# Patient Record
Sex: Female | Born: 1960 | Race: White | Hispanic: No | Smoking: Former smoker
Health system: Southern US, Community
[De-identification: ages and names within clinical notes are randomized; demographics above are authoritative.]

## PROBLEM LIST (undated history)

## (undated) DIAGNOSIS — F419 Anxiety disorder, unspecified: Secondary | ICD-10-CM

## (undated) DIAGNOSIS — I2510 Atherosclerotic heart disease of native coronary artery without angina pectoris: Secondary | ICD-10-CM

## (undated) DIAGNOSIS — Z7401 Bed confinement status: Secondary | ICD-10-CM

## (undated) DIAGNOSIS — I69311 Memory deficit following cerebral infarction: Secondary | ICD-10-CM

## (undated) DIAGNOSIS — F329 Major depressive disorder, single episode, unspecified: Secondary | ICD-10-CM

## (undated) DIAGNOSIS — M4628 Osteomyelitis of vertebra, sacral and sacrococcygeal region: Secondary | ICD-10-CM

## (undated) DIAGNOSIS — E785 Hyperlipidemia, unspecified: Secondary | ICD-10-CM

## (undated) DIAGNOSIS — F32A Depression, unspecified: Secondary | ICD-10-CM

## (undated) DIAGNOSIS — I2699 Other pulmonary embolism without acute cor pulmonale: Secondary | ICD-10-CM

## (undated) DIAGNOSIS — Z862 Personal history of diseases of the blood and blood-forming organs and certain disorders involving the immune mechanism: Secondary | ICD-10-CM

## (undated) DIAGNOSIS — I639 Cerebral infarction, unspecified: Secondary | ICD-10-CM

## (undated) DIAGNOSIS — L89159 Pressure ulcer of sacral region, unspecified stage: Secondary | ICD-10-CM

## (undated) DIAGNOSIS — I10 Essential (primary) hypertension: Secondary | ICD-10-CM

## (undated) DIAGNOSIS — Z931 Gastrostomy status: Secondary | ICD-10-CM

## (undated) DIAGNOSIS — M1990 Unspecified osteoarthritis, unspecified site: Secondary | ICD-10-CM

## (undated) DIAGNOSIS — E119 Type 2 diabetes mellitus without complications: Secondary | ICD-10-CM

## (undated) DIAGNOSIS — E039 Hypothyroidism, unspecified: Secondary | ICD-10-CM

## (undated) DIAGNOSIS — G43909 Migraine, unspecified, not intractable, without status migrainosus: Secondary | ICD-10-CM

## (undated) DIAGNOSIS — I82409 Acute embolism and thrombosis of unspecified deep veins of unspecified lower extremity: Secondary | ICD-10-CM

## (undated) DIAGNOSIS — E1142 Type 2 diabetes mellitus with diabetic polyneuropathy: Secondary | ICD-10-CM

## (undated) DIAGNOSIS — E113519 Type 2 diabetes mellitus with proliferative diabetic retinopathy with macular edema, unspecified eye: Secondary | ICD-10-CM

## (undated) HISTORY — DX: Hypothyroidism, unspecified: E03.9

## (undated) HISTORY — DX: Major depressive disorder, single episode, unspecified: F32.9

## (undated) HISTORY — PX: CORONARY ARTERY BYPASS GRAFT: SHX141

## (undated) HISTORY — DX: Anxiety disorder, unspecified: F41.9

## (undated) HISTORY — DX: Migraine, unspecified, not intractable, without status migrainosus: G43.909

## (undated) HISTORY — DX: Unspecified osteoarthritis, unspecified site: M19.90

## (undated) HISTORY — PX: TUBAL LIGATION: SHX77

## (undated) HISTORY — DX: Personal history of diseases of the blood and blood-forming organs and certain disorders involving the immune mechanism: Z86.2

## (undated) HISTORY — DX: Depression, unspecified: F32.A

---

## 1999-01-09 ENCOUNTER — Encounter: Admission: RE | Admit: 1999-01-09 | Discharge: 1999-04-09 | Payer: Self-pay | Admitting: Family Medicine

## 2000-07-07 ENCOUNTER — Other Ambulatory Visit: Admission: RE | Admit: 2000-07-07 | Discharge: 2000-07-07 | Payer: Self-pay | Admitting: Obstetrics and Gynecology

## 2001-06-11 ENCOUNTER — Encounter: Admission: RE | Admit: 2001-06-11 | Discharge: 2001-09-09 | Payer: Self-pay

## 2001-06-26 ENCOUNTER — Other Ambulatory Visit: Admission: RE | Admit: 2001-06-26 | Discharge: 2001-06-26 | Payer: Self-pay | Admitting: Obstetrics and Gynecology

## 2001-09-09 ENCOUNTER — Encounter: Admission: RE | Admit: 2001-09-09 | Discharge: 2001-12-08 | Payer: Self-pay

## 2001-12-15 ENCOUNTER — Inpatient Hospital Stay (HOSPITAL_COMMUNITY): Admission: AD | Admit: 2001-12-15 | Discharge: 2001-12-15 | Payer: Self-pay | Admitting: Obstetrics and Gynecology

## 2001-12-15 ENCOUNTER — Encounter: Payer: Self-pay | Admitting: Obstetrics and Gynecology

## 2001-12-21 ENCOUNTER — Ambulatory Visit (HOSPITAL_COMMUNITY): Admission: RE | Admit: 2001-12-21 | Discharge: 2001-12-21 | Payer: Self-pay | Admitting: Obstetrics and Gynecology

## 2001-12-21 ENCOUNTER — Inpatient Hospital Stay (HOSPITAL_COMMUNITY): Admission: AD | Admit: 2001-12-21 | Discharge: 2001-12-24 | Payer: Self-pay | Admitting: Obstetrics & Gynecology

## 2002-02-03 ENCOUNTER — Other Ambulatory Visit: Admission: RE | Admit: 2002-02-03 | Discharge: 2002-02-03 | Payer: Self-pay | Admitting: Obstetrics and Gynecology

## 2011-05-30 ENCOUNTER — Ambulatory Visit (INDEPENDENT_AMBULATORY_CARE_PROVIDER_SITE_OTHER): Payer: BC Managed Care – PPO | Admitting: Family Medicine

## 2011-05-30 DIAGNOSIS — F411 Generalized anxiety disorder: Secondary | ICD-10-CM

## 2011-05-30 DIAGNOSIS — K3189 Other diseases of stomach and duodenum: Secondary | ICD-10-CM

## 2011-05-30 DIAGNOSIS — IMO0001 Reserved for inherently not codable concepts without codable children: Secondary | ICD-10-CM

## 2011-06-07 ENCOUNTER — Ambulatory Visit (INDEPENDENT_AMBULATORY_CARE_PROVIDER_SITE_OTHER): Payer: BC Managed Care – PPO | Admitting: Internal Medicine

## 2011-06-07 ENCOUNTER — Ambulatory Visit: Payer: BC Managed Care – PPO | Admitting: Radiology

## 2011-06-07 VITALS — BP 120/76 | HR 72 | Temp 98.3°F | Resp 16 | Ht 62.0 in | Wt 166.0 lb

## 2011-06-07 DIAGNOSIS — M533 Sacrococcygeal disorders, not elsewhere classified: Secondary | ICD-10-CM

## 2011-06-07 NOTE — Progress Notes (Signed)
Ruth Mayo comes in today with a 2 to three-year history of recurrent problems with pain in the coccyx. This is gotten progressively worse. She now has some problems sleeping because of it. It is usually okay in the morning and worsens as the day goes on even if she's sitting on a pillow. She knows of no injury that started this. There is no other history of arthritis. She has recently been diagnosed with diabetes by Dr. Audria Nine started on metformin.  Review of systems includes no other musculoskeletal problems.  Physical examination reveals a tender coccyx and lower sacrum with some bony prominences that are not symmetrical.   X-ray reveals exostoses on the coccyxbut no destructive lesions.  Problem #1 coxodynia  Plan is orthopedic referral

## 2011-07-03 ENCOUNTER — Ambulatory Visit (INDEPENDENT_AMBULATORY_CARE_PROVIDER_SITE_OTHER): Payer: BC Managed Care – PPO | Admitting: Family Medicine

## 2011-07-03 ENCOUNTER — Encounter: Payer: Self-pay | Admitting: Family Medicine

## 2011-07-03 DIAGNOSIS — F419 Anxiety disorder, unspecified: Secondary | ICD-10-CM | POA: Insufficient documentation

## 2011-07-03 DIAGNOSIS — E109 Type 1 diabetes mellitus without complications: Secondary | ICD-10-CM

## 2011-07-03 DIAGNOSIS — E669 Obesity, unspecified: Secondary | ICD-10-CM

## 2011-07-03 DIAGNOSIS — Z23 Encounter for immunization: Secondary | ICD-10-CM

## 2011-07-03 DIAGNOSIS — Z0000 Encounter for general adult medical examination without abnormal findings: Secondary | ICD-10-CM

## 2011-07-03 DIAGNOSIS — Z01419 Encounter for gynecological examination (general) (routine) without abnormal findings: Secondary | ICD-10-CM

## 2011-07-03 DIAGNOSIS — E119 Type 2 diabetes mellitus without complications: Secondary | ICD-10-CM | POA: Insufficient documentation

## 2011-07-03 DIAGNOSIS — R1013 Epigastric pain: Secondary | ICD-10-CM | POA: Insufficient documentation

## 2011-07-03 DIAGNOSIS — E66811 Obesity, class 1: Secondary | ICD-10-CM | POA: Insufficient documentation

## 2011-07-03 LAB — IFOBT (OCCULT BLOOD): IFOBT: NEGATIVE

## 2011-07-03 MED ORDER — INSULIN ASPART PROT & ASPART (70-30 MIX) 100 UNIT/ML ~~LOC~~ SUSP: SUBCUTANEOUS | Status: DC

## 2011-07-03 MED ORDER — LISINOPRIL 5 MG PO TABS: 5.0000 mg | ORAL_TABLET | Freq: Every day | ORAL | Status: DC

## 2011-07-03 MED ORDER — LORAZEPAM 1 MG PO TABS: ORAL_TABLET | ORAL | Status: DC

## 2011-07-03 NOTE — Patient Instructions (Signed)
Tetanus, Diphtheria (Td) or Tetanus, Diphtheria, Pertussis (Tdap) Vaccine What You Need to Know WHY GET VACCINATED? Tetanus, diphtheria and pertussis can be very serious diseases. TETANUS (Lockjaw) causes painful muscle spasms and stiffness, usually all over the body.  Tetanus can lead to tightening of muscles in the head and neck so the victim cannot open his mouth or swallow, or sometimes even breathe. Tetanus kills about 1 out of 5 people who are infected.  DIPHTHERIA can cause a thick membrane to cover the back of the throat.  Diphtheria can lead to breathing problems, paralysis, heart failure, and even death.  PERTUSSIS (Whooping Cough) causes severe coughing spells which can lead to difficulty breathing, vomiting, and disturbed sleep.  Pertussis can lead to weight loss, incontinence, rib fractures and passing out from violent coughing. Up to 2 in 100 adolescents and 5 in 100 adults with pertussis are hospitalized or have complications, including pneumonia and death.  These 3 diseases are all caused by bacteria. Diphtheria and pertussis are spread from person to person. Tetanus enters the body through cuts, scratches, or wounds. The [place] saw as many as 200,000 cases a year of diphtheria and pertussis before vaccines were available, and hundreds of cases of tetanus. Since then, tetanus and diphtheria cases have dropped by about 99% and pertussis cases by about 92%. Children 44 years of age and younger get DTaP vaccine to protect them from these three diseases. But older children, adolescents, and adults need protection too. VACCINES FOR ADOLESCENTS AND ADULTS: TD AND TDAP Two vaccines are available to protect people 51 years of age and older from these diseases:  Td vaccine has been used for many years. It protects against tetanus and diphtheria.   Tdap vaccine was licensed in 2005. It is the first vaccine for adolescents and adults that protects against pertussis as well as tetanus  and diphtheria.  A Td booster dose is recommended every 10 years. Tdap is given only once. WHICH VACCINE, AND WHEN? Ages 7 through 18 years  A dose of Tdap is recommended at age 32 or 73. This dose could be given as early as age 58 for children who missed one or more childhood doses of DTaP.   Children and adolescents who did not get a complete series of DTaP shots by age 22 should complete the series using a combination of Td and Tdap.  Age 27 years and Older  All adults should get a booster dose of Td every 10 years. Adults under 65 who have never gotten Tdap should get a dose of Tdap as their next booster dose. Adults 65 and older may get one booster dose of Tdap.   Adults (including women who may become pregnant and adults 36 and older) who expect to have close contact with a baby younger than 94 months of age should get a dose of Tdap to help protect the baby from pertussis.   Healthcare professionals who have direct patient contact in hospitals or clinics should get one dose of Tdap.  Protection After a Wound  A person who gets a severe cut or burn might need a dose of Td or Tdap to prevent tetanus infection. Tdap should be used for anyone who has never had a dose previously. Td should be used if Tdap is not available, or for:   Anybody who has already had a dose of Tdap.   Children 7 through 87 years of age who completed the childhood DTaP series.   Adults 65 and older.  Pregnant Women  Pregnant women who have never had a dose of Tdap should get one, after the 20th week of gestation and preferably during the 3rd trimester. If they do not get Tdap during their pregnancy they should get a dose as soon as possible after delivery. Pregnant women who have previously received Tdap and need tetanus or diphtheria vaccine while pregnant should get Td.  Tdap and Td may be given at the same time as other vaccines. SOME PEOPLE SHOULD NOT BE VACCINATED OR SHOULD WAIT  Anyone who has had a  life-threatening allergic reaction after a dose of any tetanus, diphtheria, or pertussis containing vaccine should not get Td or Tdap.   Anyone who has a severe allergy to any component of a vaccine should not get that vaccine. Tell your doctor if the person getting the vaccine has any severe allergies.   Anyone who had a coma, or long or multiple seizures within 7 days after a dose of DTP or DTaP should not get Tdap, unless a cause other than the vaccine was found. These people may get Td.   Talk to your doctor if the person getting either vaccine:   Has epilepsy or another nervous system problem.   Had severe swelling or severe pain after a previous dose of DTP, DTaP, DT, Td, or Tdap vaccine.   Has had Guillain Barr Syndrome (GBS).  Anyone who has a moderate or severe illness on the day the shot is scheduled should usually wait until they recover before getting Tdap or Td vaccine. A person with a mild illness or low fever can usually be vaccinated. WHAT ARE THE RISKS FROM TDAP AND TD VACCINES? With a vaccine, as with any medicine, there is always a small risk of a life-threatening allergic reaction or other serious problem. Brief fainting spells and related symptoms (such as jerking movements) can happen after any medical procedure, including vaccination. Sitting or lying down for about 15 minutes after a vaccination can help prevent fainting and injuries caused by falls. Tell your doctor if the patient feels dizzy or lightheaded, or has vision changes or ringing in the ears. Getting tetanus, diphtheria, or pertussis would be much more likely to lead to severe problems than getting either Td or Tdap vaccine. Problems reported after Td and Tdap vaccines are listed below. Mild Problems (noticeable, but did not interfere with activities) Tdap  Pain (about 3 in 4 adolescents and 2 in 3 adults).   Redness or swelling (about 1 in 5).   Mild fever of at least 100.4 F (38 C) (up to about 1 in  25 adolescents and 1 in 100 adults).   Headache (about 4 in 10 adolescents and 3 in 10 adults).   Tiredness (about 1 in 3 adolescents and 1 in 4 adults).   Nausea, vomiting, diarrhea, or stomach ache (up to 1 in 4 adolescents and 1 in 10 adults).   Chills, body aches, sore joints, rash, or swollen glands (uncommon).  Td  Pain (up to about 8 in 10).   Redness or swelling at the injection site (up to about 1 in 3).   Mild fever (up to about 1 in 15).   Headache or tiredness (uncommon).  Moderate Problems (interfered with activities, but did not require medical attention) Tdap  Pain at the injection site (about 1 in 20 adolescents and 1 in 100 adults).   Redness or swelling at the injection site (up to about 1 in 16 adolescents and 1 in 25  adults).   Fever over 102 F (38.9 C) (about 1 in 100 adolescents and 1 in 250 adults).   Headache (1 in 300).   Nausea, vomiting, diarrhea, or stomach ache (up to 3 in 100 adolescents and 1 in 100 adults).  Td  Fever over 102 F (38.9 C) (rare).  Tdap or Td  Extensive swelling of the arm where the shot was given (up to about 3 in 100).  Severe Problems (Unable to perform usual activities; required medical attention) Tdap or Td  Swelling, severe pain, bleeding, and redness in the arm where the shot was given (rare).  A severe allergic reaction could occur after any vaccine. They are estimated to occur less than once in a million doses. WHAT IF THERE IS A SEVERE REACTION? What should I look for? Any unusual condition, such as a severe allergic reaction or a high fever. If a severe allergic reaction occurred, it would be within a few minutes to an hour after the shot. Signs of a serious allergic reaction can include difficulty breathing, weakness, hoarseness or wheezing, a fast heartbeat, hives, dizziness, paleness, or swelling of the throat. What should I do?  Call a doctor, or get the person to a doctor right away.   Tell your doctor  what happened, the date and time it happened, and when the vaccination was given.   Ask your provider to report the reaction by filing a Vaccine Adverse Event Reporting System (VAERS) form. Or, you can file this report through the VAERS website at www.vaers.LAgents.no or by calling 1-567-022-0429.  VAERS does not provide medical advice. THE NATIONAL VACCINE INJURY COMPENSATION PROGRAM The National Vaccine Injury Compensation Program (VICP) was created in 1986. Persons who believe they may have been injured by a vaccine can learn about the program and about filing a claim by calling 1-(929)677-8400 or visiting the VICP website at SpiritualWord.at. HOW CAN I LEARN MORE?  Your doctor can give you the vaccine package insert or suggest other sources of information.   Call your local or state health department.   Contact the Centers for Disease Control and Prevention (CDC):   Call 859 330 3607 (1-800-CDC-INFO).   Visit the [facility] website at PicCapture.uy.  CDC Td and Tdap Interim VIS (1/12) Document Released: 02/17/2006 Document Revised: 01/08/2011 Document Reviewed: 05/29/2010 [facility] Patient Information 2012 [place], [place]   .Keeping You Healthy  Get These Tests 1. Blood Pressure- Have your blood pressure checked once a year by your health care provider.  Normal blood pressure is 120/80. 2. Weight- Have your body mass index (BMI) calculated to screen for obesity.  BMI is measure of body fat based on height and weight.  You can also calculate your own BMI at https://www.west-esparza.com/. 3. Cholesterol- Have your cholesterol checked every 5 years starting at age 75 then yearly starting at age 18. 4. Chlamydia, HIV, and other sexually transmitted diseases- Get screened every year until age 44, then within three months of each new sexual provider. 5. Pap Smear- Every 1-3 years; discuss with your health care provider. 6. Mammogram- Every year starting at age 53  Take these  medicines  Calcium with Vitamin D-Your body needs 1200 mg of Calcium each day and [phone] IU of Vitamin D daily.  Your body can only absorb 500 mg of Calcium at a time so Calcium must be taken in 2 or 3 divided doses throughout the day.  Multivitamin with folic acid- Once daily if it is possible for you to become pregnant.  Get these  Immunizations  Gardasil-Series of three doses; prevents HPV related illness such as genital warts and cervical cancer.  Menactra-Single dose; prevents meningitis.  Tetanus shot- Every 10 years.  Flu shot-Every year.  Take these steps 1. Do not smoke-Your healthcare provider can help you quit.  For tips on how to quit go to www.smokefree.gov or call 1-800 QUITNOW. 2. Be physically active- Exercise 5 days a week for at least 30 minutes.  If you are not already physically active, start slow and gradually work up to 30 minutes of moderate physical activity.  Examples of moderate activity include walking briskly, dancing, swimming, bicycling, etc. 3. Breast Cancer- A self breast exam every month is important for early detection of breast cancer.  For more information and instruction on self breast exams, ask your healthcare provider or San[place]iscoGazette.es. 4. Eat a healthy diet- Eat a variety of healthy foods such as fruits, vegetables, whole grains, low fat milk, low fat cheeses, yogurt, lean meats, poultry and fish, beans, nuts, tofu, etc.  For more information go to www. Thenutritionsource.org 5. Drink alcohol in moderation- Limit alcohol intake to one drink or less per day. Never drink and drive. 6. Depression- Your emotional health is as important as your physical health.  If you're feeling down or losing interest in things you normally enjoy please talk to your healthcare provider about being screened for depression. 7. Dental visit- Brush and floss your teeth twice daily; visit your dentist twice a year. 8. Eye doctor- Get an eye exam  at least every 2 years. 9. Helmet use- Always wear a helmet when riding a bicycle, motorcycle, rollerblading or skateboarding. 10. Safe sex- If you may be exposed to sexually transmitted infections, use a condom. 11. Seat belts- Seat belts can save your live; always wear one. 12. Smoke/Carbon Monoxide detectors- These detectors need to be installed on the appropriate level of your home. Replace batteries at least once a year. 13. Skin cancer- When out in the sun please cover up and use sunscreen 15 SPF or higher. 14. Violence- If anyone is threatening or hurting you, please tell your healthcare provider.

## 2011-07-03 NOTE — Progress Notes (Signed)
Subjective:    Patient ID: Ruth Mayo, female    DOB: 02/20/1961, 51 y.o.   MRN: [id number]  HPI This 51 y.o Cauc female presents for CPE with PAP. She has Insulin-dependent Diabetes with  last A1c= 9.6 % on 05/30/11. Her last PAP was more than 3 years ago; she has had no recent abnl PAPs. She is married and recent personal change includes having mother-in-law move within 1 mile of her home. She also has a 97 y.o. Son with ADHD. This has caused increased anxiety, not responding to Lorazepam  prescribed last visit. She does exercise- walking twice a week for ~ 20 minutes.  Family Hx: Positive for HTN, DM, Cardiovascular Disease, High Cholesterol, Asthma, Mental Illness  HCM: last Dilated Diabetic Eye exam- 2 years ago  Review of Systems  HENT: Positive for neck stiffness.   Gastrointestinal: Positive for nausea. Negative for vomiting, abdominal pain, diarrhea, constipation and blood in stool.       Heartburn/ indigestion- pt did not get Pepcid AC as advised at last visit  Genitourinary:       Nocturia > 2 times each night  Musculoskeletal: Positive for arthralgias.       Stiffness involving hands, wrists, shoulders, hips Hx of right ankle injury, now with pain and sweeling for which she takes Ibuprofen 400 mg 5-6 x daily  Neurological: Positive for headaches. Negative for dizziness, syncope, weakness and numbness.  Psychiatric/Behavioral: The patient is nervous/anxious.   All other systems reviewed and are negative.       Objective:   Physical Exam  Nursing note and vitals reviewed. Constitutional: She is oriented to person, place, and time. She appears well-developed and well-nourished. No distress.  HENT:  Head: Normocephalic and atraumatic.  Right Ear: External ear normal.  Left Ear: External ear normal.  Nose: Nose normal.  Mouth/Throat: Oropharynx is clear and moist.  Eyes: Conjunctivae and EOM are normal. Pupils are equal, round, and reactive to light. No scleral  icterus.  Neck: Normal range of motion. Neck supple. No thyromegaly present.  Cardiovascular: Normal rate, regular rhythm, normal heart sounds and intact distal pulses.  Exam reveals no gallop.   No murmur heard. Pulmonary/Chest: Effort normal and breath sounds normal. No respiratory distress.  Abdominal: Soft. Bowel sounds are normal. She exhibits no distension and no mass. There is tenderness. There is no guarding.  Genitourinary: Guaiac negative stool.       NEFG; vaginal orifice and mucosa normal. Cervix normal with minimal yellowish discharge. (PAP done) Bimanual exam: unremarkable- Uterus midline and no adnexal tenderness Rectal exam: no masses noted, hemorrhoids present w/o active bleeding.  Musculoskeletal: She exhibits no edema and no tenderness.  Lymphadenopathy:    She has no cervical adenopathy.  Neurological: She is alert and oriented to person, place, and time. She has normal reflexes. No cranial nerve deficit. Coordination normal.  Skin: Skin is warm and dry.  Psychiatric: She has a normal mood and affect. Her behavior is normal. Judgment and thought content normal.  Breasts: Symmetric; left breast with nontender, mobile 1 cm nodule medial to areola                No nipple discharge or inverted nipples. No axillary lymphadenopathy.  Results for orders placed in visit on 07/03/11  IFOBT (OCCULT BLOOD)      Component Value Range   IFOBT Negative           Assessment & Plan:   1. Routine general medical examination at a  health care facility  MM Digital Screening, EKG 12-Lead, IFOBT POC (occult bld, rslt in office)  2. Immunization due  Tdap vaccine greater than or equal to 7yo IM  3. Pap test, as part of routine gynecological examination  Pap IG w/ reflex to HPV when ASC-U  4. Type 1 diabetes mellitus  Microalbumin, urine; continue current Insulin dose and home monitoring  5. Anxiety disorder  Increase Lorazepam to 1 mg tab which pt can take 1/2 to 1 tab up to 3x daily  as needed; currently, she only needs  to relax and sleep

## 2011-07-04 LAB — PAP IG W/ RFLX HPV ASCU

## 2011-07-05 NOTE — Progress Notes (Signed)
Quick Note:  Notify pt of Normal results. ______ 

## 2011-07-16 ENCOUNTER — Other Ambulatory Visit: Payer: Self-pay | Admitting: Physician Assistant

## 2011-07-16 DIAGNOSIS — Z1239 Encounter for other screening for malignant neoplasm of breast: Secondary | ICD-10-CM

## 2011-07-16 DIAGNOSIS — Z139 Encounter for screening, unspecified: Secondary | ICD-10-CM

## 2011-07-29 ENCOUNTER — Ambulatory Visit
Admission: RE | Admit: 2011-07-29 | Discharge: 2011-07-29 | Disposition: A | Discharge status: Home / Self Care | Payer: BC Managed Care – PPO | Source: Ambulatory Visit | Attending: Physician Assistant | Admitting: Physician Assistant

## 2011-07-29 DIAGNOSIS — Z1239 Encounter for other screening for malignant neoplasm of breast: Secondary | ICD-10-CM

## 2011-07-31 ENCOUNTER — Other Ambulatory Visit: Payer: Self-pay | Admitting: Physician Assistant

## 2011-07-31 ENCOUNTER — Telehealth: Payer: Self-pay | Admitting: Radiology

## 2011-07-31 DIAGNOSIS — R928 Other abnormal and inconclusive findings on diagnostic imaging of breast: Secondary | ICD-10-CM

## 2011-07-31 NOTE — Telephone Encounter (Signed)
Informed pt of report--she had not [facility]. Told her to call them asap to arrange appt for further imaging.

## 2011-07-31 NOTE — Telephone Encounter (Signed)
Message copied by Luretha Murphy on Wed Jul 31, 2011  3:09 PM ------      Message from: Porfirio Oar S      Created: Tue Jul 30, 2011 12:30 PM       Please call the imaging site.  Has the patient been notified of the need for additional imagine, or do we need to?

## 2011-08-05 ENCOUNTER — Ambulatory Visit
Admission: RE | Admit: 2011-08-05 | Discharge: 2011-08-05 | Disposition: A | Discharge status: Home / Self Care | Payer: BC Managed Care – PPO | Source: Ambulatory Visit | Attending: Physician Assistant | Admitting: Physician Assistant

## 2011-08-05 DIAGNOSIS — R928 Other abnormal and inconclusive findings on diagnostic imaging of breast: Secondary | ICD-10-CM

## 2011-09-04 ENCOUNTER — Other Ambulatory Visit: Payer: Self-pay | Admitting: Family Medicine

## 2011-09-05 ENCOUNTER — Other Ambulatory Visit: Payer: Self-pay

## 2011-10-02 ENCOUNTER — Ambulatory Visit: Payer: BC Managed Care – PPO | Admitting: Family Medicine

## 2011-12-09 NOTE — Progress Notes (Signed)
This encounter was created in error - please disregard.

## 2017-03-08 DIAGNOSIS — H5213 Myopia, bilateral: Secondary | ICD-10-CM | POA: Diagnosis not present

## 2017-05-20 ENCOUNTER — Ambulatory Visit: Payer: Self-pay | Admitting: Physician Assistant

## 2017-06-06 ENCOUNTER — Ambulatory Visit: Payer: Self-pay | Admitting: Physician Assistant

## 2017-06-13 ENCOUNTER — Other Ambulatory Visit: Payer: Self-pay

## 2017-06-13 ENCOUNTER — Ambulatory Visit: Payer: BLUE CROSS/BLUE SHIELD | Admitting: Physician Assistant

## 2017-06-13 ENCOUNTER — Encounter: Payer: Self-pay | Admitting: Physician Assistant

## 2017-06-13 VITALS — BP 140/80 | HR 87 | Temp 98.5°F | Resp 18 | Ht 62.0 in | Wt 169.8 lb

## 2017-06-13 DIAGNOSIS — Z794 Long term (current) use of insulin: Secondary | ICD-10-CM | POA: Diagnosis not present

## 2017-06-13 DIAGNOSIS — F411 Generalized anxiety disorder: Secondary | ICD-10-CM | POA: Diagnosis not present

## 2017-06-13 DIAGNOSIS — E039 Hypothyroidism, unspecified: Secondary | ICD-10-CM

## 2017-06-13 DIAGNOSIS — Z1211 Encounter for screening for malignant neoplasm of colon: Secondary | ICD-10-CM

## 2017-06-13 DIAGNOSIS — E1142 Type 2 diabetes mellitus with diabetic polyneuropathy: Secondary | ICD-10-CM

## 2017-06-13 DIAGNOSIS — F439 Reaction to severe stress, unspecified: Secondary | ICD-10-CM

## 2017-06-13 DIAGNOSIS — E7800 Pure hypercholesterolemia, unspecified: Secondary | ICD-10-CM

## 2017-06-13 DIAGNOSIS — Z1329 Encounter for screening for other suspected endocrine disorder: Secondary | ICD-10-CM | POA: Diagnosis not present

## 2017-06-13 DIAGNOSIS — R7989 Other specified abnormal findings of blood chemistry: Secondary | ICD-10-CM

## 2017-06-13 DIAGNOSIS — E1140 Type 2 diabetes mellitus with diabetic neuropathy, unspecified: Secondary | ICD-10-CM | POA: Insufficient documentation

## 2017-06-13 MED ORDER — LORAZEPAM 1 MG PO TABS: 1.0000 mg | ORAL_TABLET | Freq: Two times a day (BID) | ORAL | 0 refills | Status: DC

## 2017-06-13 MED ORDER — LISINOPRIL 5 MG PO TABS: 5.0000 mg | ORAL_TABLET | Freq: Every day | ORAL | 3 refills | Status: DC

## 2017-06-13 MED ORDER — METFORMIN HCL 1000 MG PO TABS: 1000.0000 mg | ORAL_TABLET | Freq: Two times a day (BID) | ORAL | 0 refills | Status: DC

## 2017-06-13 MED ORDER — GABAPENTIN 300 MG PO CAPS: 600.0000 mg | ORAL_CAPSULE | Freq: Three times a day (TID) | ORAL | 2 refills | Status: DC

## 2017-06-13 MED ORDER — ESCITALOPRAM OXALATE 10 MG PO TABS: 10.0000 mg | ORAL_TABLET | Freq: Every day | ORAL | 2 refills | Status: DC

## 2017-06-13 NOTE — Patient Instructions (Addendum)
Please download the APP called mychart - then use the text to activate this APP - this will allow you to look at your labs and contact me as well as make appointments to see me in the future.     IF you received an x-ray today, you will receive an invoice from [facility]. Please contact [facility] at [phone] with questions or concerns regarding your invoice.   IF you received labwork today, you will receive an invoice from [facility]. Please contact [facility] at [phone] with questions or concerns regarding your invoice.   Our billing staff will not be able to assist you with questions regarding bills from these companies.  You will be contacted with the lab results as soon as they are available. The fastest way to get your results is to activate your My Chart account. Instructions are located on the last page of this paperwork. If you have not heard from us regarding the results in 2 weeks, please contact this office.     

## 2017-06-13 NOTE — Addendum Note (Signed)
Addended by: Morrell RiddleWEBER, Darlen Gledhill L on: 06/13/2017 05:50 PM   Modules accepted: Orders

## 2017-06-13 NOTE — Progress Notes (Signed)
Ruth Mayo  MRN: [id number] DOB: 12/28/1960  PCP: No primary care provider on file.  Chief Complaint  Patient presents with  . Establish Care    Subjective:  Pt presents to clinic to establish care.  She has not had diabetic care in over a year.  She does not check her glucose at home.  She has continued to use gabapentin 34m 5x/day to help with DM neuropathy - she cannot feel her toes but the rest of her foot is good- she does have burning and pain all over the feet.  She is having increased stress recently due to splitting up with husband.  She has always had problems with anxiety and has always used ativan 1-2x day and she hopes to get some more of that today.  She has never been on daily treatment medications though she is willing to use them.  History is obtained by patient.  Review of Systems  Constitutional: Negative for chills and fever.  Respiratory: Negative for shortness of breath.   Cardiovascular: Negative for chest pain, palpitations and leg swelling.  Skin: Negative for wound.  Neurological: Numbness: feet/toes.    Patient Active Problem List   Diagnosis Date Noted  . Diabetic neuropathy ([place] 06/13/2017  . Stress at home 06/13/2017  . Type 2 diabetes mellitus ([place] 07/03/2011  . Anxiety disorder 07/03/2011  . Dyspepsia 07/03/2011  . Obesity (BMI 30.0-34.9) 07/03/2011    Current Outpatient Medications on File Prior to Visit  Medication Sig Dispense Refill  . insulin aspart protamine-insulin aspart (NOVOLOG 70/30) (70-30) 100 UNIT/ML injection Take 15 units three times a day before each meal. (Patient not taking: Reported on 06/13/2017) 10 mL 5   No current facility-administered medications on file prior to visit.     No Known Allergies  Past Medical History:  Diagnosis Date  . Anxiety   . Arthritis   . Depression   . Diabetes mellitus   . Migraines    Social History   Social History Narrative   Work - desk work - hCommunity education officer  Separated from husband   Son --    Social History   Tobacco Use  . Smoking status: Former Smoker    Packs/day: 1.00    Years: 25.00    Pack years: 25.00    Types: Cigarettes    Last attempt to quit: 06/07/2003    Years since quitting: 14.0  . Smokeless tobacco: Never Used  Substance Use Topics  . Alcohol use: Yes    Comment: a couple glasses wine a week  . Drug use: No   family history includes Diabetes in her maternal grandmother and mother.     Objective:  BP 140/80   Pulse 87   Temp 98.5 F (36.9 C) (Oral)   Resp 18   Ht 5' 2" (1.575 m)   Wt 169 lb 12.8 oz (77 kg)   LMP 05/15/2017   SpO2 99%   BMI 31.06 kg/m  Body mass index is 31.06 kg/m.  Physical Exam  Constitutional: She is oriented to person, place, and time and well-developed, well-nourished, and in no distress.  HENT:  Head: Normocephalic and atraumatic.  Right Ear: External ear normal.  Left Ear: External ear normal.  Eyes: Conjunctivae are normal.  Cardiovascular: Normal rate, regular rhythm and normal heart sounds.  No murmur heard. Pulmonary/Chest: Effort normal and breath sounds normal.  Neurological: She is alert and oriented to person, place, and time. Gait normal.  Skin: Skin is warm  and dry.  Psychiatric: Mood, memory, affect and judgment normal.  Vitals reviewed.   Assessment and Plan :  Type 2 diabetes mellitus with diabetic polyneuropathy, with long-term current use of insulin (HCC) - Plan: CMP14+EGFR, Hemoglobin A1c, Lipid panel, Microalbumin, urine, lisinopril (PRINIVIL,ZESTRIL) 5 MG tablet, metFORMIN (GLUCOPHAGE) 1000 MG tablet, HM DIABETES FOOT EXAM - start metformin - regular release was used because patient cannot swallow large pills - we will determine additional medications based on her lab results - I suspect she will also need a SLGT2 or DLP  Diabetic polyneuropathy associated with type 2 diabetes mellitus ([place]) - Plan: gabapentin (NEURONTIN) 300 MG capsule - change dose to 668m  tid - if she finds that at night she still has trouble we can increase her pm dose - she will let me know what her results are in about 2 weeks  Generalized anxiety disorder - Plan: escitalopram (LEXAPRO) 10 MG tablet, LORazepam (ATIVAN) 1 MG tablet - ativan is not a good daily medication and pt has had long standing anxiety and she needs a daily medication - we will start lexapro today and give her ativan for current increase in stress due to separation  Stress at home - Plan: LORazepam (ATIVAN) 1 MG tablet  Screen for colon cancer - Plan: Cologuard - screening   Screen breast cancer - pt will call and make an appt for this  Screening for thyroid disorder - Plan: TSH - check labs  SWindell HummingbirdPA-C  [facility] 06/13/2017 3:55 PM

## 2017-06-14 LAB — MICROALBUMIN, URINE: MICROALBUM., U, RANDOM: 5 ug/mL

## 2017-06-14 LAB — CMP14+EGFR
A/G RATIO: 1.2 (ref 1.2–2.2)
ALBUMIN: 4.1 g/dL (ref 3.5–5.5)
ALT: 9 IU/L (ref 0–32)
AST: 9 IU/L (ref 0–40)
Alkaline Phosphatase: 62 IU/L (ref 39–117)
BUN / CREAT RATIO: 23 (ref 9–23)
BUN: 14 mg/dL (ref 6–24)
Bilirubin Total: 0.3 mg/dL (ref 0.0–1.2)
CALCIUM: 10.3 mg/dL — AB (ref 8.7–10.2)
CO2: 23 mmol/L (ref 20–29)
Chloride: 102 mmol/L (ref 96–106)
Creatinine, Ser: 0.62 mg/dL (ref 0.57–1.00)
GFR, EST AFRICAN AMERICAN: 118 mL/min/{1.73_m2} (ref >59)
GFR, EST NON AFRICAN AMERICAN: 103 mL/min/{1.73_m2} (ref >59)
GLOBULIN, TOTAL: 3.3 g/dL (ref 1.5–4.5)
Glucose: 378 mg/dL — ABNORMAL HIGH (ref 65–99)
POTASSIUM: 4.4 mmol/L (ref 3.5–5.2)
SODIUM: 138 mmol/L (ref 134–144)
Total Protein: 7.4 g/dL (ref 6.0–8.5)

## 2017-06-14 LAB — LIPID PANEL
CHOL/HDL RATIO: 4 ratio (ref 0.0–4.4)
Cholesterol, Total: 202 mg/dL — ABNORMAL HIGH (ref 100–199)
HDL: 50 mg/dL (ref >39)
LDL CALC: 128 mg/dL — AB (ref 0–99)
Triglycerides: 119 mg/dL (ref 0–149)
VLDL CHOLESTEROL CAL: 24 mg/dL (ref 5–40)

## 2017-06-14 LAB — TSH: TSH: 8.52 u[IU]/mL — ABNORMAL HIGH (ref 0.450–4.500)

## 2017-06-14 LAB — HEMOGLOBIN A1C
ESTIMATED AVERAGE GLUCOSE: 315 mg/dL
Hgb A1c MFr Bld: 12.6 % — ABNORMAL HIGH (ref 4.8–5.6)

## 2017-06-17 DIAGNOSIS — R7989 Other specified abnormal findings of blood chemistry: Secondary | ICD-10-CM | POA: Diagnosis not present

## 2017-06-17 NOTE — Addendum Note (Signed)
Addended by: Baldwin CrownJOHNSON, Tina Gruner D on: 06/17/2017 02:30 PM   Modules accepted: Orders

## 2017-06-18 LAB — T4, FREE: Free T4: 0.81 ng/dL — ABNORMAL LOW (ref 0.82–1.77)

## 2017-06-19 ENCOUNTER — Encounter: Payer: Self-pay | Admitting: Physician Assistant

## 2017-06-20 ENCOUNTER — Encounter: Payer: Self-pay | Admitting: Physician Assistant

## 2017-06-20 DIAGNOSIS — E7800 Pure hypercholesterolemia, unspecified: Secondary | ICD-10-CM | POA: Insufficient documentation

## 2017-06-20 DIAGNOSIS — E039 Hypothyroidism, unspecified: Secondary | ICD-10-CM

## 2017-06-20 MED ORDER — ROSUVASTATIN CALCIUM 10 MG PO TABS: 10.0000 mg | ORAL_TABLET | Freq: Every day | ORAL | 3 refills | Status: DC

## 2017-06-20 MED ORDER — DULAGLUTIDE 0.75 MG/0.5ML ~~LOC~~ SOAJ: 0.7500 mg | SUBCUTANEOUS | 0 refills | Status: DC

## 2017-06-20 MED ORDER — SYNTHROID 25 MCG PO TABS: 25.0000 ug | ORAL_TABLET | Freq: Every day | ORAL | 0 refills | Status: DC

## 2017-06-20 NOTE — Addendum Note (Signed)
Addended by: Morrell RiddleWEBER, SARAH L on: 06/20/2017 12:53 PM   Modules accepted: Orders

## 2017-06-20 NOTE — Addendum Note (Signed)
Addended by: Morrell RiddleWEBER, SARAH L on: 06/20/2017 03:04 PM   Modules accepted: Orders

## 2017-06-21 MED ORDER — LEVOTHYROXINE SODIUM 25 MCG PO TABS: 25.0000 ug | ORAL_TABLET | Freq: Every day | ORAL | 0 refills | Status: DC

## 2017-06-24 ENCOUNTER — Other Ambulatory Visit: Payer: Self-pay | Admitting: Physician Assistant

## 2017-06-24 DIAGNOSIS — E039 Hypothyroidism, unspecified: Secondary | ICD-10-CM

## 2017-06-24 MED ORDER — LEVOTHYROXINE SODIUM 25 MCG PO TABS: 25.0000 ug | ORAL_TABLET | Freq: Every day | ORAL | 0 refills | Status: DC

## 2017-07-04 ENCOUNTER — Encounter: Payer: Self-pay | Admitting: Physician Assistant

## 2017-07-20 ENCOUNTER — Other Ambulatory Visit: Payer: Self-pay | Admitting: Physician Assistant

## 2017-07-20 DIAGNOSIS — Z794 Long term (current) use of insulin: Secondary | ICD-10-CM

## 2017-07-20 DIAGNOSIS — F411 Generalized anxiety disorder: Secondary | ICD-10-CM

## 2017-07-20 DIAGNOSIS — E1142 Type 2 diabetes mellitus with diabetic polyneuropathy: Secondary | ICD-10-CM

## 2017-07-20 DIAGNOSIS — F439 Reaction to severe stress, unspecified: Secondary | ICD-10-CM

## 2017-07-21 MED ORDER — LORAZEPAM 1 MG PO TABS
1.0000 mg | ORAL_TABLET | Freq: Two times a day (BID) | ORAL | 0 refills | Status: DC
Start: 2017-07-21 — End: 2017-08-19

## 2017-07-23 ENCOUNTER — Telehealth: Payer: Self-pay | Admitting: Physician Assistant

## 2017-07-23 NOTE — Telephone Encounter (Signed)
Called pt to try and reschedule her for her appt with Benny LennertSarah Weber on 08/05/17. Left VM for pt to call the office and reschedule. When pt calls in, please reschedule her at her convenience with Benny LennertSarah Weber.  Thanks!

## 2017-07-31 ENCOUNTER — Telehealth: Payer: Self-pay | Admitting: *Deleted

## 2017-07-31 NOTE — Telephone Encounter (Signed)
Denial letter from [facility] Put in provider box

## 2017-08-05 ENCOUNTER — Ambulatory Visit: Payer: Self-pay | Admitting: Physician Assistant

## 2017-08-12 ENCOUNTER — Encounter: Payer: Self-pay | Admitting: Physician Assistant

## 2017-08-12 ENCOUNTER — Ambulatory Visit: Payer: BLUE CROSS/BLUE SHIELD | Admitting: Physician Assistant

## 2017-08-15 NOTE — Telephone Encounter (Signed)
I have a letter in my 104 box where Ruth Mayo has denied one of my patient her trulicity because the letter states it is not covered - can we call them and see what is going on.  Thanks

## 2017-08-19 ENCOUNTER — Other Ambulatory Visit: Payer: Self-pay | Admitting: Physician Assistant

## 2017-08-19 ENCOUNTER — Encounter: Payer: Self-pay | Admitting: Physician Assistant

## 2017-08-19 ENCOUNTER — Other Ambulatory Visit: Payer: Self-pay

## 2017-08-19 DIAGNOSIS — F439 Reaction to severe stress, unspecified: Secondary | ICD-10-CM

## 2017-08-19 DIAGNOSIS — F411 Generalized anxiety disorder: Secondary | ICD-10-CM

## 2017-08-19 NOTE — Telephone Encounter (Signed)
Patient is requesting a refill of the following medications: Requested Prescriptions   Pending Prescriptions Disp Refills  . LORazepam (ATIVAN) 1 MG tablet 60 tablet 0    Sig: Take 1 tablet (1 mg total) by mouth 2 (two) times daily. for anxiety    Date of patient request: 08/19/2017 Last office visit: 06/13/2017 Date of last refill: 07/21/2017 Last refill amount: 60 Follow up time period per chart: return in 3 months

## 2017-08-21 MED ORDER — LORAZEPAM 1 MG PO TABS: 1.0000 mg | ORAL_TABLET | Freq: Two times a day (BID) | ORAL | 0 refills | Status: DC

## 2017-08-23 ENCOUNTER — Encounter: Payer: Self-pay | Admitting: Physician Assistant

## 2017-08-23 ENCOUNTER — Ambulatory Visit: Payer: BLUE CROSS/BLUE SHIELD | Admitting: Family Medicine

## 2017-08-23 ENCOUNTER — Other Ambulatory Visit: Payer: Self-pay

## 2017-08-23 VITALS — BP 130/76 | HR 74 | Temp 98.0°F | Resp 16 | Ht 62.0 in | Wt 171.4 lb

## 2017-08-23 DIAGNOSIS — S41101A Unspecified open wound of right upper arm, initial encounter: Secondary | ICD-10-CM

## 2017-08-23 DIAGNOSIS — E039 Hypothyroidism, unspecified: Secondary | ICD-10-CM | POA: Diagnosis not present

## 2017-08-23 DIAGNOSIS — Z8639 Personal history of other endocrine, nutritional and metabolic disease: Secondary | ICD-10-CM

## 2017-08-23 NOTE — Progress Notes (Signed)
Ruth Mayo  MRN: [id number] DOB: Jan 11, 1961  PCP: Morrell Riddle, PA-C  Chief Complaint  Patient presents with  . thyroid check    f/u    Subjective:  Pt presents to clinic for lab work to recheck her thyroid.  At her last visit she was diagnosed with hypothyroidism and she started her medications - she feels better less fatigued.  She is still cold all the time and is curious about her iron level.  She has not yet started her trulicity due to problems with paperwork.  She wished to try the generic medication due to cost.  She got a bite on her right arm 2-3 days ago - it became a blister and it is really itchy - she busted the blister and it has not refilled.  She has been using benadryl to help with the itching.  History is obtained by patient.  Review of Systems  Constitutional: Positive for fatigue (better since start of medication).  Endocrine: Positive for cold intolerance. Negative for heat intolerance and polydipsia.    Patient Active Problem List   Diagnosis Date Noted  . Elevated LDL cholesterol level 06/20/2017  . Acquired hypothyroidism 06/20/2017  . Diabetic neuropathy (HCC) 06/13/2017  . Stress at home 06/13/2017  . Type 2 diabetes mellitus (HCC) 07/03/2011  . Anxiety disorder 07/03/2011  . Dyspepsia 07/03/2011  . Obesity (BMI 30.0-34.9) 07/03/2011    Current Outpatient Medications on File Prior to Visit  Medication Sig Dispense Refill  . escitalopram (LEXAPRO) 10 MG tablet Take 1 tablet (10 mg total) by mouth daily. 30 tablet 2  . gabapentin (NEURONTIN) 300 MG capsule Take 2 capsules (600 mg total) by mouth 3 (three) times daily. 180 capsule 2  . levothyroxine (SYNTHROID) 25 MCG tablet Take 1 tablet (25 mcg total) by mouth daily before breakfast. 90 tablet 0  . lisinopril (PRINIVIL,ZESTRIL) 5 MG tablet Take 1 tablet (5 mg total) by mouth daily. 90 tablet 3  . LORazepam (ATIVAN) 1 MG tablet Take 1 tablet (1 mg total) by mouth 2 (two) times daily. for  anxiety 60 tablet 0  . metFORMIN (GLUCOPHAGE) 1000 MG tablet Take 1 tablet (1,000 mg total) by mouth 2 (two) times daily with a meal. 180 tablet 0  . Dulaglutide (TRULICITY) 0.75 MG/0.5ML SOPN Inject 0.75 mg into the skin once a week. (Patient not taking: Reported on 08/23/2017) 12 pen 0  . insulin aspart protamine-insulin aspart (NOVOLOG 70/30) (70-30) 100 UNIT/ML injection Take 15 units three times a day before each meal. (Patient not taking: Reported on 06/13/2017) 10 mL 5  . rosuvastatin (CRESTOR) 10 MG tablet Take 1 tablet (10 mg total) by mouth daily. (Patient not taking: Reported on 08/23/2017) 90 tablet 3   No current facility-administered medications on file prior to visit.     No Known Allergies  Past Medical History:  Diagnosis Date  . Anxiety   . Arthritis   . Depression   . Diabetes mellitus   . Migraines    Social History   Social History Narrative   Work - desk work - Location manager   Separated from husband   Son --    Social History   Tobacco Use  . Smoking status: Former Smoker    Packs/day: 1.00    Years: 25.00    Pack years: 25.00    Types: Cigarettes    Last attempt to quit: 06/07/2003    Years since quitting: 14.2  . Smokeless tobacco: Never Used  Substance  Use Topics  . Alcohol use: Yes    Comment: a couple glasses wine a week  . Drug use: No   family history includes Diabetes in her maternal grandmother and mother.     Objective:  BP 130/76   Pulse 74   Temp 98 F (36.7 C) (Oral)   Resp 16   Ht 5\' 2"  (1.575 m)   Wt 171 lb 6.4 oz (77.7 kg)   LMP 08/18/2017   SpO2 98%   BMI 31.35 kg/m  Body mass index is 31.35 kg/m.  Physical Exam  Constitutional: She is oriented to person, place, and time. She appears well-developed and well-nourished.  HENT:  Head: Normocephalic and atraumatic.  Right Ear: Hearing and external ear normal.  Left Ear: Hearing and external ear normal.  Eyes: Conjunctivae are normal.  Neck: Normal range of  motion.  Cardiovascular: Normal rate, regular rhythm and normal heart sounds.  No murmur heard. Pulmonary/Chest: Effort normal and breath sounds normal. She has no wheezes.  Neurological: She is alert and oriented to person, place, and time.  Skin: Skin is warm and dry.  2-3 cm scabbed over area with surrounding erythema - no sign of infection - no purulence  Psychiatric: She has a normal mood and affect. Her behavior is normal. Judgment and thought content normal.  Vitals reviewed.   Assessment and Plan :  Acquired hypothyroidism - Plan: T4, Free, TSH - check labs - adjust medications if needed  History of iron deficiency - Plan: CBC with Differential/Platelet, Iron, TIBC and Ferritin Panel - check labs  Open wound of right upper arm, initial encounter - appears to be from a bite of some sort - she will use OTC steroid cream and OTC antihistamine to help with itching  Benny LennertSarah Taniela Feltus PA-C  Primary Care at [facility] 08/23/2017 9:22 AM

## 2017-08-23 NOTE — Patient Instructions (Signed)
     IF you received an x-ray today, you will receive an invoice from [facility]. Please contact [facility] at [phone] with questions or concerns regarding your invoice.   IF you received labwork today, you will receive an invoice from [facility]. Please contact [facility] at [phone] with questions or concerns regarding your invoice.   Our billing staff will not be able to assist you with questions regarding bills from these companies.  You will be contacted with the lab results as soon as they are available. The fastest way to get your results is to activate your My Chart account. Instructions are located on the last page of this paperwork. If you have not heard from us regarding the results in 2 weeks, please contact this office.     

## 2017-08-23 NOTE — Progress Notes (Deleted)
   Ruth PapasBrenda Mayo  MRN: [id number] DOB: Jun 23, 1960  PCP: Morrell RiddleWeber, Jameir Ake L, PA-C  Chief Complaint  Patient presents with  . thyroid check    f/u    Subjective:  Pt presents to clinic for  History is obtained by ***.  Review of Systems  Patient Active Problem List   Diagnosis Date Noted  . Elevated LDL cholesterol level 06/20/2017  . Acquired hypothyroidism 06/20/2017  . Diabetic neuropathy (HCC) 06/13/2017  . Stress at home 06/13/2017  . Type 2 diabetes mellitus (HCC) 07/03/2011  . Anxiety disorder 07/03/2011  . Dyspepsia 07/03/2011  . Obesity (BMI 30.0-34.9) 07/03/2011    Current Outpatient Medications on File Prior to Visit  Medication Sig Dispense Refill  . escitalopram (LEXAPRO) 10 MG tablet Take 1 tablet (10 mg total) by mouth daily. 30 tablet 2  . gabapentin (NEURONTIN) 300 MG capsule Take 2 capsules (600 mg total) by mouth 3 (three) times daily. 180 capsule 2  . levothyroxine (SYNTHROID) 25 MCG tablet Take 1 tablet (25 mcg total) by mouth daily before breakfast. 90 tablet 0  . lisinopril (PRINIVIL,ZESTRIL) 5 MG tablet Take 1 tablet (5 mg total) by mouth daily. 90 tablet 3  . LORazepam (ATIVAN) 1 MG tablet Take 1 tablet (1 mg total) by mouth 2 (two) times daily. for anxiety 60 tablet 0  . metFORMIN (GLUCOPHAGE) 1000 MG tablet Take 1 tablet (1,000 mg total) by mouth 2 (two) times daily with a meal. 180 tablet 0  . Dulaglutide (TRULICITY) 0.75 MG/0.5ML SOPN Inject 0.75 mg into the skin once a week. (Patient not taking: Reported on 08/23/2017) 12 pen 0  . insulin aspart protamine-insulin aspart (NOVOLOG 70/30) (70-30) 100 UNIT/ML injection Take 15 units three times a day before each meal. (Patient not taking: Reported on 06/13/2017) 10 mL 5  . rosuvastatin (CRESTOR) 10 MG tablet Take 1 tablet (10 mg total) by mouth daily. (Patient not taking: Reported on 08/23/2017) 90 tablet 3   No current facility-administered medications on file prior to visit.     No Known  Allergies  Past Medical History:  Diagnosis Date  . Anxiety   . Arthritis   . Depression   . Diabetes mellitus   . 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 Smoking status: Former Smoker    Packs/day: 1.00    Years: 25.00    Pack years: 25.00    Types: Cigarettes    Last attempt to quit: 06/07/2003    Years since quitting: 14.2  . Smokeless tobacco: Never Used  Substance Use Topics  . Alcohol use: Yes    Comment: a couple glasses wine a week  . Drug use: No   family history includes Diabetes in her maternal grandmother and mother.     Objective:  BP 130/76   Pulse 74   Temp 98 F (36.7 C) (Oral)   Resp 16   Ht 5\' 2"  (1.575 m)   Wt 171 lb 6.4 oz (77.7 kg)   LMP 08/18/2017   SpO2 98%   BMI 31.35 kg/m  Body mass index is 31.35 kg/m.  Physical Exam  Assessment and Plan :  No diagnosis found.  Benny LennertSarah Roxane Puerto PA-C  Primary Care at [facility] 08/23/2017 8:14 AM

## 2017-08-24 LAB — CBC WITH DIFFERENTIAL/PLATELET
BASOS ABS: 0 10*3/uL (ref 0.0–0.2)
BASOS: 0 %
EOS (ABSOLUTE): 0.2 10*3/uL (ref 0.0–0.4)
Eos: 4 %
Hematocrit: 33.8 % — ABNORMAL LOW (ref 34.0–46.6)
Hemoglobin: 10.6 g/dL — ABNORMAL LOW (ref 11.1–15.9)
IMMATURE GRANS (ABS): 0 10*3/uL (ref 0.0–0.1)
Immature Granulocytes: 0 %
LYMPHS ABS: 1 10*3/uL (ref 0.7–3.1)
LYMPHS: 21 %
MCH: 27 pg (ref 26.6–33.0)
MCHC: 31.4 g/dL — AB (ref 31.5–35.7)
MCV: 86 fL (ref 79–97)
MONOS ABS: 0.3 10*3/uL (ref 0.1–0.9)
Monocytes: 6 %
NEUTROS ABS: 3.2 10*3/uL (ref 1.4–7.0)
Neutrophils: 69 %
PLATELETS: 263 10*3/uL (ref 150–379)
RBC: 3.92 x10E6/uL (ref 3.77–5.28)
RDW: 14 % (ref 12.3–15.4)
WBC: 4.6 10*3/uL (ref 3.4–10.8)

## 2017-08-24 LAB — IRON,TIBC AND FERRITIN PANEL
Ferritin: 6 ng/mL — ABNORMAL LOW (ref 15–150)
Iron Saturation: 7 % — CL (ref 15–55)
Iron: 22 ug/dL — ABNORMAL LOW (ref 27–159)
TIBC: 310 ug/dL (ref 250–450)
UIBC: 288 ug/dL (ref 131–425)

## 2017-08-24 LAB — T4, FREE: FREE T4: 0.87 ng/dL (ref 0.82–1.77)

## 2017-08-24 LAB — TSH: TSH: 5.56 u[IU]/mL — AB (ref 0.450–4.500)

## 2017-08-30 MED ORDER — LEVOTHYROXINE SODIUM 50 MCG PO TABS: 50.0000 ug | ORAL_TABLET | Freq: Every day | ORAL | 0 refills | Status: DC

## 2017-08-30 NOTE — Addendum Note (Signed)
Addended by: Morrell Riddle on: 08/30/2017 10:06 AM   Modules accepted: Orders

## 2017-09-04 ENCOUNTER — Ambulatory Visit: Payer: BLUE CROSS/BLUE SHIELD | Admitting: Physician Assistant

## 2017-09-04 ENCOUNTER — Encounter: Payer: Self-pay | Admitting: Physician Assistant

## 2017-09-04 ENCOUNTER — Other Ambulatory Visit: Payer: Self-pay

## 2017-09-04 VITALS — BP 138/78 | HR 108 | Temp 99.0°F | Resp 16 | Ht 62.25 in | Wt 168.0 lb

## 2017-09-04 DIAGNOSIS — Z794 Long term (current) use of insulin: Secondary | ICD-10-CM

## 2017-09-04 DIAGNOSIS — E1142 Type 2 diabetes mellitus with diabetic polyneuropathy: Secondary | ICD-10-CM | POA: Diagnosis not present

## 2017-09-04 DIAGNOSIS — S41101A Unspecified open wound of right upper arm, initial encounter: Secondary | ICD-10-CM | POA: Diagnosis not present

## 2017-09-04 MED ORDER — SILVER SULFADIAZINE 1 % EX CREA: 1.0000 "application " | TOPICAL_CREAM | Freq: Every day | CUTANEOUS | 0 refills | Status: DC

## 2017-09-04 NOTE — Progress Notes (Signed)
Ruth Mayo  MRN: [id number] DOB: 1960-10-23  PCP: Morrell Riddle, PA-C  Chief Complaint  Patient presents with  . Insect Bite    RIGHT arm x 2 weeks ago  . Nausea    this morning or last night    Subjective:  Pt presents to clinic for recheck of wound on her right forearm.  She was last seen on 4/20 and the wound has continued to deepen though it has not enlarged in size.  There is no surrounding erythema of the wound.  It is still tender to the touch.  She has been keeping it covered with Neosporin and bandage during the day and open to air at night.  She has had no fevers chills and overall does not feel poorly.  The patient has not yet heard from [facility] regarding her Trulicity.  History is obtained by patient.  Review of Systems  Constitutional: Negative for chills and fever.  Skin: Positive for wound.    Patient Active Problem List   Diagnosis Date Noted  . Elevated LDL cholesterol level 06/20/2017  . Acquired hypothyroidism 06/20/2017  . Diabetic neuropathy (HCC) 06/13/2017  . Stress at home 06/13/2017  . Type 2 diabetes mellitus (HCC) 07/03/2011  . Anxiety disorder 07/03/2011  . Dyspepsia 07/03/2011  . Obesity (BMI 30.0-34.9) 07/03/2011    Current Outpatient Medications on File Prior to Visit  Medication Sig Dispense Refill  . escitalopram (LEXAPRO) 10 MG tablet Take 1 tablet (10 mg total) by mouth daily. 30 tablet 2  . gabapentin (NEURONTIN) 300 MG capsule Take 2 capsules (600 mg total) by mouth 3 (three) times daily. 180 capsule 2  . levothyroxine (SYNTHROID, LEVOTHROID) 50 MCG tablet Take 1 tablet (50 mcg total) by mouth daily. 90 tablet 0  . lisinopril (PRINIVIL,ZESTRIL) 5 MG tablet Take 1 tablet (5 mg total) by mouth daily. 90 tablet 3  . LORazepam (ATIVAN) 1 MG tablet Take 1 tablet (1 mg total) by mouth 2 (two) times daily. for anxiety 60 tablet 0  . metFORMIN (GLUCOPHAGE) 1000 MG tablet Take 1 tablet (1,000 mg total) by mouth 2 (two) times daily with a  meal. 180 tablet 0  . rosuvastatin (CRESTOR) 10 MG tablet Take 1 tablet (10 mg total) by mouth daily. 90 tablet 3  . Dulaglutide (TRULICITY) 0.75 MG/0.5ML SOPN Inject 0.75 mg into the skin once a week. (Patient not taking: Reported on 09/04/2017) 12 pen 0  . insulin aspart protamine-insulin aspart (NOVOLOG 70/30) (70-30) 100 UNIT/ML injection Take 15 units three times a day before each meal. (Patient not taking: Reported on 06/13/2017) 10 mL 5   No current facility-administered medications on file prior to visit.     No Known Allergies  Past Medical History:  Diagnosis Date  . Anxiety   . Arthritis   . Depression   . Diabetes mellitus   . Migraines    Social History   Social History Narrative   Work - desk work - Location manager   Separated from husband   Son --    Social History   Tobacco Use  . Smoking status: Former Smoker    Packs/day: 1.00    Years: 25.00    Pack years: 25.00    Types: Cigarettes    Last attempt to quit: 06/07/2003    Years since quitting: 14.2  . Smokeless tobacco: Never Used  Substance Use Topics  . Alcohol use: Yes    Comment: a couple glasses wine a week  . Drug  use: No   family history includes Diabetes in her maternal grandmother and mother.     Objective:  BP 138/78   Pulse (!) 108   Temp 99 F (37.2 C) (Oral)   Resp 16   Ht 5' 2.25" (1.581 m)   Wt 168 lb (76.2 kg)   LMP 08/18/2017   SpO2 97%   BMI 30.48 kg/m  Body mass index is 30.48 kg/m.  Physical Exam  Constitutional: She is oriented to person, place, and time. She appears well-developed and well-nourished.  HENT:  Head: Normocephalic and atraumatic.  Right Ear: Hearing and external ear normal.  Left Ear: Hearing and external ear normal.  Eyes: Conjunctivae are normal.  Neck: Normal range of motion.  Pulmonary/Chest: Effort normal.  Neurological: She is alert and oriented to person, place, and time.  Skin: Skin is warm and dry.  Wound is on right forearm -   10x74mm in size without erythema. TTP  Psychiatric: Judgment normal.  Vitals reviewed.   Assessment and Plan :  Open wound of right upper arm, initial encounter - Plan: silver sulfADIAZINE (SILVADENE) 1 % cream  Type 2 diabetes mellitus with diabetic polyneuropathy, with long-term current use of insulin (HCC)   Keep moist - we will recheck in a week as she has a eschar over the wound which is likely not allowing it to heal.  Hopefully at that time the eschar can be removed which will allow healing.  Her uncontrolled DM is likely also causing increased healing time.  Benny Lennert PA-C  Primary Care at [facility] 09/04/2017 12:30 PM

## 2017-09-04 NOTE — Patient Instructions (Signed)
     IF you received an x-ray today, you will receive an invoice from [facility]. Please contact [facility] at [phone] with questions or concerns regarding your invoice.   IF you received labwork today, you will receive an invoice from [facility]. Please contact [facility] at [phone] with questions or concerns regarding your invoice.   Our billing staff will not be able to assist you with questions regarding bills from these companies.  You will be contacted with the lab results as soon as they are available. The fastest way to get your results is to activate your My Chart account. Instructions are located on the last page of this paperwork. If you have not heard from us regarding the results in 2 weeks, please contact this office.     

## 2017-09-10 ENCOUNTER — Other Ambulatory Visit: Payer: Self-pay | Admitting: Physician Assistant

## 2017-09-10 DIAGNOSIS — F439 Reaction to severe stress, unspecified: Secondary | ICD-10-CM

## 2017-09-10 DIAGNOSIS — F411 Generalized anxiety disorder: Secondary | ICD-10-CM

## 2017-09-11 NOTE — Telephone Encounter (Signed)
Patient is requesting a refill of the following medications: Requested Prescriptions   Pending Prescriptions Disp Refills  . LORazepam (ATIVAN) 1 MG tablet 60 tablet 0    Sig: Take 1 tablet (1 mg total) by mouth 2 (two) times daily. for anxiety    Date of patient request: 09/10/2017 Last office visit: 09/04/2017 Date of last refill:08/21/2017  Last refill amount: 60 Follow up time period per chart:

## 2017-09-15 ENCOUNTER — Other Ambulatory Visit: Payer: Self-pay | Admitting: Physician Assistant

## 2017-09-15 DIAGNOSIS — F411 Generalized anxiety disorder: Secondary | ICD-10-CM

## 2017-09-15 DIAGNOSIS — E1142 Type 2 diabetes mellitus with diabetic polyneuropathy: Secondary | ICD-10-CM

## 2017-09-15 MED ORDER — LORAZEPAM 1 MG PO TABS: 1.0000 mg | ORAL_TABLET | Freq: Two times a day (BID) | ORAL | 0 refills | Status: DC

## 2017-09-19 ENCOUNTER — Ambulatory Visit: Payer: BLUE CROSS/BLUE SHIELD | Admitting: Physician Assistant

## 2017-09-20 ENCOUNTER — Encounter: Payer: Self-pay | Admitting: Physician Assistant

## 2017-09-20 ENCOUNTER — Other Ambulatory Visit: Payer: Self-pay

## 2017-09-20 ENCOUNTER — Ambulatory Visit: Payer: BLUE CROSS/BLUE SHIELD | Admitting: Physician Assistant

## 2017-09-20 VITALS — BP 133/82 | HR 69 | Temp 98.6°F | Resp 18 | Ht 61.89 in | Wt 169.8 lb

## 2017-09-20 DIAGNOSIS — S41101A Unspecified open wound of right upper arm, initial encounter: Secondary | ICD-10-CM

## 2017-09-20 DIAGNOSIS — E1142 Type 2 diabetes mellitus with diabetic polyneuropathy: Secondary | ICD-10-CM | POA: Diagnosis not present

## 2017-09-20 DIAGNOSIS — Z794 Long term (current) use of insulin: Secondary | ICD-10-CM | POA: Diagnosis not present

## 2017-09-20 NOTE — Progress Notes (Signed)
Brinly Maietta  MRN: [id number] DOB: 1960/08/09  PCP: Morrell Riddle, PA-C  Chief Complaint  Patient presents with  . Wound Check    f/u- om right arm    Subjective:  Pt presents to clinic for recheck of wound on right arm.  She has been keeping this moist since her last visit with Silvadene cream and a dressing.  She has been unable to remove any of the SR at home.  She has minimal pain it is only when it is pushed in certain directions.  She has had no fevers chills or any other signs of infection.  She does not check her sugars at home.  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  History is obtained by patient.  Review of Systems  Constitutional: Negative for chills and fever.  Skin: Positive for wound.    Patient Active Problem List   Diagnosis Date Noted  . Elevated LDL cholesterol level 06/20/2017  . Acquired hypothyroidism 06/20/2017  . Diabetic neuropathy (HCC) 06/13/2017  . Stress at home 06/13/2017  . Type 2 diabetes mellitus (HCC) 07/03/2011  . Anxiety disorder 07/03/2011  . Dyspepsia 07/03/2011  . Obesity (BMI 30.0-34.9) 07/03/2011    Current Outpatient Medications on File Prior to Visit  Medication Sig Dispense Refill  . escitalopram (LEXAPRO) 10 MG tablet TAKE ONE TABLET BY MOUTH DAILY 30 tablet 0  . gabapentin (NEURONTIN) 300 MG capsule TAKE TWO CAPSULES BY MOUTH THREE TIMES A DAY 180 capsule 0  . levothyroxine (SYNTHROID, LEVOTHROID) 50 MCG tablet Take 1 tablet (50 mcg total) by mouth daily. 90 tablet 0  . lisinopril (PRINIVIL,ZESTRIL) 5 MG tablet Take 1 tablet (5 mg total) by mouth daily. 90 tablet 3  . LORazepam (ATIVAN) 1 MG tablet Take 1 tablet (1 mg total) by mouth 2 (two) times daily. for anxiety 60 tablet 0  . metFORMIN (GLUCOPHAGE) 1000 MG tablet Take 1 tablet (1,000 mg total) by mouth 2 (two) times daily with a meal. 180 tablet 0  . rosuvastatin (CRESTOR) 10 MG tablet Take 1 tablet (10 mg total) by mouth daily.  90 tablet 3  . silver sulfADIAZINE (SILVADENE) 1 % cream Apply 1 application topically daily. 50 g 0  . Dulaglutide (TRULICITY) 0.75 MG/0.5ML SOPN Inject 0.75 mg into the skin once a week. (Patient not taking: Reported on 09/04/2017) 12 pen 0  . insulin aspart protamine-insulin aspart (NOVOLOG 70/30) (70-30) 100 UNIT/ML injection Take 15 units three times a day before each meal. (Patient not taking: Reported on 06/13/2017) 10 mL 5   No current facility-administered medications on file prior to visit.     No Known Allergies  Past Medical History:  Diagnosis Date  . Anxiety   . Arthritis   . Depression   . Diabetes mellitus   . Migraines    Social History   Social History Narrative   Work - desk work - Location manager   Separated from husband   Son --    Social History   Tobacco Use  . Smoking status: Former Smoker    Packs/day: 1.00    Years: 25.00    Pack years: 25.00    Types: Cigarettes    Last attempt to quit: 06/07/2003    Years since quitting: 14.2  . Smokeless tobacco: Never Used  Substance Use Topics  . Alcohol use: Yes    Comment: a couple glasses wine a week  . Drug use: No   family history  includes Diabetes in her maternal grandmother and mother.     Objective:  BP 133/82 (BP Location: Right Arm, Patient Position: Sitting, Cuff Size: Normal)   Pulse 69   Temp 98.6 F (37 C) (Oral)   Resp 18   Ht 5' 1.89" (1.572 m)   Wt 169 lb 12.8 oz (77 kg)   LMP 08/18/2017 (Approximate)   SpO2 99%   BMI 31.17 kg/m  Body mass index is 31.17 kg/m.  Physical Exam  Constitutional: She is oriented to person, place, and time. She appears well-developed and well-nourished.  HENT:  Head: Normocephalic and atraumatic.  Right Ear: Hearing and external ear normal.  Left Ear: Hearing and external ear normal.  Eyes: Conjunctivae are normal.  Neck: Normal range of motion.  Pulmonary/Chest: Effort normal.  Neurological: She is alert and oriented to person, place, and  time.  Skin: Skin is warm, dry and intact.  Psychiatric: She has a normal mood and affect. Her behavior is normal. Judgment and thought content normal.  Vitals reviewed.     Procedure: Consent obtained.  2% lidocaine with epinephrine local anesthesia.  Eschar removed from wound bed with #15 blade.  Wound bed has good granulation tissue and bleeding.  Silvadene dressing placed  Assessment and Plan :  Open wound of right upper arm, initial encounter no no improvement since last visit.  Eschar removed today which shows good granulation bed which should improve healing time.  Patient will continue to keep covered and moist.  We did talk about going to the for the fact that needs to be covered with a waterproof Band-Aid.  She will continue to send me pictures with measurements over my chart so I can see her sooner if need be otherwise I will see her back in 2 weeks.  Type 2 diabetes mellitus with diabetic polyneuropathy, with long-term current use of insulin (HCC) -patient has uncontrolled diabetes still waiting to hear from [facility] about a patient assistant program.  Encourage patient to perhaps reapply at this time.  Continue with her regular medications that she has been doing daily.  Benny Lennert PA-C  Primary Care at [facility] 09/20/2017 8:46 AM

## 2017-09-20 NOTE — Patient Instructions (Signed)
     IF you received an x-ray today, you will receive an invoice from [facility]. Please contact  Radiology at [phone] with questions or concerns regarding your invoice.   IF you received labwork today, you will receive an invoice from [facility]. Please contact [facility] at [phone] with questions or concerns regarding your invoice.   Our billing staff will not be able to assist you with questions regarding bills from these companies.  You will be contacted with the lab results as soon as they are available. The fastest way to get your results is to activate your My Chart account. Instructions are located on the last page of this paperwork. If you have not heard from us regarding the results in 2 weeks, please contact this office.     

## 2017-09-25 ENCOUNTER — Telehealth: Payer: Self-pay

## 2017-09-25 NOTE — Telephone Encounter (Signed)
Pt advised that trulicity is ready for pickup at 104 5 boxes total in med fridge.

## 2017-09-29 ENCOUNTER — Encounter: Payer: Self-pay | Admitting: Family Medicine

## 2017-10-02 ENCOUNTER — Encounter: Payer: Self-pay | Admitting: Physician Assistant

## 2017-10-02 ENCOUNTER — Other Ambulatory Visit: Payer: Self-pay

## 2017-10-02 ENCOUNTER — Ambulatory Visit: Payer: BLUE CROSS/BLUE SHIELD | Admitting: Physician Assistant

## 2017-10-02 VITALS — Temp 98.8°F | Ht 61.81 in | Wt 167.0 lb

## 2017-10-02 DIAGNOSIS — E1142 Type 2 diabetes mellitus with diabetic polyneuropathy: Secondary | ICD-10-CM

## 2017-10-02 DIAGNOSIS — Z794 Long term (current) use of insulin: Secondary | ICD-10-CM | POA: Diagnosis not present

## 2017-10-02 DIAGNOSIS — S41101D Unspecified open wound of right upper arm, subsequent encounter: Secondary | ICD-10-CM | POA: Diagnosis not present

## 2017-10-02 NOTE — Progress Notes (Signed)
Ruth Mayo  MRN: [id number] DOB: Oct 05, 1960  PCP: Morrell Riddle, PA-C  Chief Complaint  Patient presents with  . Follow-up    open wound of right upper arm    Subjective:  Pt presents to clinic for recheck of her wound.  It looks much better per patient  She is keeping it covered.  She is having less pain.  Pt just started her trulicity this week after getting it through the [facility].  She has been nauseated since.  She is not eating large meals - she is just generally nauseated.  History is obtained by patient.  Review of Systems  Constitutional: Negative for chills and fever.  Skin: Positive for wound.    Patient Active Problem List   Diagnosis Date Noted  . Elevated LDL cholesterol level 06/20/2017  . Acquired hypothyroidism 06/20/2017  . Diabetic neuropathy (HCC) 06/13/2017  . Stress at home 06/13/2017  . Type 2 diabetes mellitus (HCC) 07/03/2011  . Anxiety disorder 07/03/2011  . Dyspepsia 07/03/2011  . Obesity (BMI 30.0-34.9) 07/03/2011    Current Outpatient Medications on File Prior to Visit  Medication Sig Dispense Refill  . Dulaglutide (TRULICITY) 0.75 MG/0.5ML SOPN Inject 0.75 mg into the skin once a week. 12 pen 0  . escitalopram (LEXAPRO) 10 MG tablet TAKE ONE TABLET BY MOUTH DAILY 30 tablet 0  . gabapentin (NEURONTIN) 300 MG capsule TAKE TWO CAPSULES BY MOUTH THREE TIMES A DAY 180 capsule 0  . insulin aspart protamine-insulin aspart (NOVOLOG 70/30) (70-30) 100 UNIT/ML injection Take 15 units three times a day before each meal. 10 mL 5  . levothyroxine (SYNTHROID, LEVOTHROID) 50 MCG tablet Take 1 tablet (50 mcg total) by mouth daily. 90 tablet 0  . lisinopril (PRINIVIL,ZESTRIL) 5 MG tablet Take 1 tablet (5 mg total) by mouth daily. 90 tablet 3  . LORazepam (ATIVAN) 1 MG tablet Take 1 tablet (1 mg total) by mouth 2 (two) times daily. for anxiety 60 tablet 0  . metFORMIN (GLUCOPHAGE) 1000 MG tablet Take 1 tablet (1,000 mg total) by mouth 2 (two)  times daily with a meal. 180 tablet 0  . rosuvastatin (CRESTOR) 10 MG tablet Take 1 tablet (10 mg total) by mouth daily. 90 tablet 3  . silver sulfADIAZINE (SILVADENE) 1 % cream Apply 1 application topically daily. 50 g 0   No current facility-administered medications on file prior to visit.     No Known Allergies  Past Medical History:  Diagnosis Date  . Anxiety   . Arthritis   . Depression   . Diabetes mellitus   . Migraines    Social History   Social History Narrative   Work - desk work - Location manager   Separated from husband   Son --    Social History   Tobacco Use  . Smoking status: Former Smoker    Packs/day: 1.00    Years: 25.00    Pack years: 25.00    Types: Cigarettes    Last attempt to quit: 06/07/2003    Years since quitting: 14.3  . Smokeless tobacco: Never Used  Substance Use Topics  . Alcohol use: Yes    Comment: a couple glasses wine a week  . Drug use: No   family history includes Diabetes in her maternal grandmother and mother.     Objective:  Temp 98.8 F (37.1 C) (Oral)   Ht 5' 1.81" (1.57 m)   Wt 167 lb (75.8 kg)   SpO2 98%  BMI 30.73 kg/m  Body mass index is 30.73 kg/m.  Physical Exam  Constitutional: She is oriented to person, place, and time. She appears well-developed and well-nourished.  HENT:  Head: Normocephalic and atraumatic.  Right Ear: Hearing and external ear normal.  Left Ear: Hearing and external ear normal.  Eyes: Conjunctivae are normal.  Neck: Normal range of motion.  Pulmonary/Chest: Effort normal.  Neurological: She is alert and oriented to person, place, and time.  Skin: Skin is warm, dry and intact.  Wound is healed about 70% from her last visit 2 weeks ago when the eschar was removed.  There is new skin growth around the entire edge and ~2mm of healthy granulation tissue.  Psychiatric: She has a normal mood and affect. Her behavior is normal. Judgment and thought content normal.  Vitals  reviewed.   Assessment and Plan :  Open wound of right upper arm, subsequent encounter - continue keeping covered - once all dry pink new skin ok to stop covering  Type 2 diabetes mellitus with diabetic polyneuropathy, with long-term current use of insulin (HCC) - continue trulity in hopes that the nausea improves  Benny Lennert PA-C  Primary Care at [facility] 10/02/2017 8:21 AM

## 2017-10-02 NOTE — Patient Instructions (Signed)
     IF you received an x-ray today, you will receive an invoice from [facility]. Please contact [facility] at [phone] with questions or concerns regarding your invoice.   IF you received labwork today, you will receive an invoice from [facility]. Please contact [facility] at [phone] with questions or concerns regarding your invoice.   Our billing staff will not be able to assist you with questions regarding bills from these companies.  You will be contacted with the lab results as soon as they are available. The fastest way to get your results is to activate your My Chart account. Instructions are located on the last page of this paperwork. If you have not heard from us regarding the results in 2 weeks, please contact this office.     

## 2017-10-13 ENCOUNTER — Other Ambulatory Visit: Payer: Self-pay | Admitting: Physician Assistant

## 2017-10-13 DIAGNOSIS — Z794 Long term (current) use of insulin: Secondary | ICD-10-CM

## 2017-10-13 DIAGNOSIS — E1142 Type 2 diabetes mellitus with diabetic polyneuropathy: Secondary | ICD-10-CM

## 2017-10-13 DIAGNOSIS — F439 Reaction to severe stress, unspecified: Secondary | ICD-10-CM

## 2017-10-13 DIAGNOSIS — F411 Generalized anxiety disorder: Secondary | ICD-10-CM

## 2017-10-13 NOTE — Telephone Encounter (Signed)
Patient is requesting a refill of the following medications: Requested Prescriptions   Pending Prescriptions Disp Refills  . metFORMIN (GLUCOPHAGE) 1000 MG tablet 180 tablet 0    Sig: Take 1 tablet (1,000 mg total) by mouth 2 (two) times daily with a meal.  . LORazepam (ATIVAN) 1 MG tablet 60 tablet 0    Sig: Take 1 tablet (1 mg total) by mouth 2 (two) times daily. for anxiety  . escitalopram (LEXAPRO) 10 MG tablet 30 tablet 0    Sig: Take 1 tablet (10 mg total) by mouth daily.  Marland Kitchen. gabapentin (NEURONTIN) 300 MG capsule 180 capsule 0    Date of patient request: 10/13/2017 Last office visit: 10/02/2017 Date of last refill: 09/15/2017 Last refill amount: 60 Follow up time period per chart:

## 2017-10-17 ENCOUNTER — Other Ambulatory Visit: Payer: Self-pay | Admitting: Physician Assistant

## 2017-10-17 DIAGNOSIS — F411 Generalized anxiety disorder: Secondary | ICD-10-CM

## 2017-10-17 DIAGNOSIS — E1142 Type 2 diabetes mellitus with diabetic polyneuropathy: Secondary | ICD-10-CM

## 2017-10-17 DIAGNOSIS — F439 Reaction to severe stress, unspecified: Secondary | ICD-10-CM

## 2017-10-17 MED ORDER — ESCITALOPRAM OXALATE 10 MG PO TABS: 10.0000 mg | ORAL_TABLET | Freq: Every day | ORAL | 0 refills | Status: DC

## 2017-10-17 MED ORDER — GABAPENTIN 300 MG PO CAPS: 300.0000 mg | ORAL_CAPSULE | Freq: Three times a day (TID) | ORAL | 0 refills | Status: DC

## 2017-10-17 NOTE — Telephone Encounter (Signed)
Patient is requesting a refill of the following medications: Requested Prescriptions   Pending Prescriptions Disp Refills  . gabapentin (NEURONTIN) 300 MG capsule [Pharmacy Med Name: GABAPENTIN 300 MG CAPSULE] 180 capsule 0    Sig: TAKE TWO CAPSULES BY MOUTH THREE TIMES A DAY  . LORazepam (ATIVAN) 1 MG tablet [Pharmacy Med Name: LORazepam 1 MG TABLET] 60 tablet 0    Sig: TAKE ONE TABLET BY MOUTH TWICE A DAY FOR ANXIETY  . escitalopram (LEXAPRO) 10 MG tablet [Pharmacy Med Name: ESCITALOPRAM 10 MG TABLET] 30 tablet 0    Sig: TAKE ONE TABLET BY MOUTH DAILY    Date of patient request: 10/17/2017 Last office visit: 10/02/2017 Date of last refill: 09/15/2017 Last refill amount:60 Follow up time period per chart:

## 2017-10-18 MED ORDER — GABAPENTIN 300 MG PO CAPS: 300.0000 mg | ORAL_CAPSULE | Freq: Three times a day (TID) | ORAL | 0 refills | Status: DC

## 2017-10-18 MED ORDER — METFORMIN HCL 1000 MG PO TABS: 1000.0000 mg | ORAL_TABLET | Freq: Two times a day (BID) | ORAL | 0 refills | Status: DC

## 2017-10-18 MED ORDER — LORAZEPAM 1 MG PO TABS: 1.0000 mg | ORAL_TABLET | Freq: Two times a day (BID) | ORAL | 0 refills | Status: DC

## 2017-10-18 MED ORDER — ESCITALOPRAM OXALATE 10 MG PO TABS: 10.0000 mg | ORAL_TABLET | Freq: Every day | ORAL | 0 refills | Status: DC

## 2017-10-24 ENCOUNTER — Ambulatory Visit: Payer: BLUE CROSS/BLUE SHIELD | Admitting: Physician Assistant

## 2017-11-12 ENCOUNTER — Other Ambulatory Visit: Payer: Self-pay | Admitting: Physician Assistant

## 2017-11-12 DIAGNOSIS — Z794 Long term (current) use of insulin: Secondary | ICD-10-CM

## 2017-11-12 DIAGNOSIS — E1142 Type 2 diabetes mellitus with diabetic polyneuropathy: Secondary | ICD-10-CM

## 2017-11-12 DIAGNOSIS — E039 Hypothyroidism, unspecified: Secondary | ICD-10-CM

## 2017-12-10 ENCOUNTER — Other Ambulatory Visit: Payer: Self-pay | Admitting: Physician Assistant

## 2017-12-10 DIAGNOSIS — F411 Generalized anxiety disorder: Secondary | ICD-10-CM

## 2017-12-10 DIAGNOSIS — F439 Reaction to severe stress, unspecified: Secondary | ICD-10-CM

## 2017-12-10 NOTE — Telephone Encounter (Signed)
Lorazepam refill Last Refill:10/18/17 #60 with 2 refills Last OV: 10/02/17 Next OV: 01/02/18 PCP: Benny LennertSarah Weber, PA  Gabapentin refill Last refill: 11/13/17 #180

## 2018-01-02 ENCOUNTER — Telehealth: Payer: Self-pay | Admitting: Obstetrics and Gynecology

## 2018-01-02 ENCOUNTER — Ambulatory Visit (INDEPENDENT_AMBULATORY_CARE_PROVIDER_SITE_OTHER): Payer: BLUE CROSS/BLUE SHIELD | Admitting: Physician Assistant

## 2018-01-02 ENCOUNTER — Other Ambulatory Visit: Payer: Self-pay

## 2018-01-02 ENCOUNTER — Encounter: Payer: Self-pay | Admitting: Physician Assistant

## 2018-01-02 VITALS — BP 130/78 | HR 91 | Resp 18 | Ht 61.18 in | Wt 172.4 lb

## 2018-01-02 DIAGNOSIS — E1142 Type 2 diabetes mellitus with diabetic polyneuropathy: Secondary | ICD-10-CM | POA: Diagnosis not present

## 2018-01-02 DIAGNOSIS — E7800 Pure hypercholesterolemia, unspecified: Secondary | ICD-10-CM | POA: Diagnosis not present

## 2018-01-02 DIAGNOSIS — N921 Excessive and frequent menstruation with irregular cycle: Secondary | ICD-10-CM

## 2018-01-02 DIAGNOSIS — F411 Generalized anxiety disorder: Secondary | ICD-10-CM | POA: Diagnosis not present

## 2018-01-02 DIAGNOSIS — E039 Hypothyroidism, unspecified: Secondary | ICD-10-CM

## 2018-01-02 DIAGNOSIS — Z794 Long term (current) use of insulin: Secondary | ICD-10-CM | POA: Diagnosis not present

## 2018-01-02 DIAGNOSIS — D500 Iron deficiency anemia secondary to blood loss (chronic): Secondary | ICD-10-CM | POA: Diagnosis not present

## 2018-01-02 DIAGNOSIS — B356 Tinea cruris: Secondary | ICD-10-CM

## 2018-01-02 DIAGNOSIS — F439 Reaction to severe stress, unspecified: Secondary | ICD-10-CM

## 2018-01-02 MED ORDER — METFORMIN HCL 1000 MG PO TABS: ORAL_TABLET | ORAL | 3 refills | Status: DC

## 2018-01-02 MED ORDER — LISINOPRIL 5 MG PO TABS: 5.0000 mg | ORAL_TABLET | Freq: Every day | ORAL | 3 refills | Status: DC

## 2018-01-02 MED ORDER — CLOTRIMAZOLE-BETAMETHASONE 1-0.05 % EX CREA: 1.0000 "application " | TOPICAL_CREAM | Freq: Two times a day (BID) | CUTANEOUS | 0 refills | Status: DC

## 2018-01-02 MED ORDER — LORAZEPAM 1 MG PO TABS: ORAL_TABLET | ORAL | 0 refills | Status: DC

## 2018-01-02 MED ORDER — ESCITALOPRAM OXALATE 20 MG PO TABS: 20.0000 mg | ORAL_TABLET | Freq: Every day | ORAL | 1 refills | Status: DC

## 2018-01-02 MED ORDER — GABAPENTIN 300 MG PO CAPS: 900.0000 mg | ORAL_CAPSULE | Freq: Three times a day (TID) | ORAL | 3 refills | Status: DC

## 2018-01-02 NOTE — Telephone Encounter (Signed)
Called and left a message for patient to call back to schedule a new patient doctor referral appointment with our office to see Dr. Oscar LaJertson for: Menometrorrhagia.

## 2018-01-02 NOTE — Patient Instructions (Signed)
° ° ° °  If you have lab work done today you will be contacted with your lab results within the next 2 weeks.  If you have not heard from us then please contact us. The fastest way to get your results is to register for My Chart. ° ° °IF you received an x-ray today, you will receive an invoice from [facility]. Please contact [facility] at [phone] with questions or concerns regarding your invoice.  ° °IF you received labwork today, you will receive an invoice from [facility]. Please contact [facility] at [phone] with questions or concerns regarding your invoice.  ° °Our billing staff will not be able to assist you with questions regarding bills from these companies. ° °You will be contacted with the lab results as soon as they are available. The fastest way to get your results is to activate your My Chart account. Instructions are located on the last page of this paperwork. If you have not heard from us regarding the results in 2 weeks, please contact this office. °  ° ° ° °

## 2018-01-02 NOTE — Progress Notes (Signed)
Ruth Mayo  MRN: [id number] DOB: 1960/06/22  PCP: Ruth Bale, PA-C  Chief Complaint  Patient presents with  . Diabetes    follow up     Subjective:  Pt presents to clinic for medication check.  She has started the trulicity about 3 months ago.   Not checking sugars. Meals - 1.5 meals - sometimes out to eat and sometimes eat at home.  Tries to keep a lower carb.  Still having foot pain and burning from her neuropathy - she is hoping she can increase her gabapentin  Mental health - still having a lot of stress with recent divorce and ex-husband - someday she takes it once a day but most of the days it is bid  Menses - heavy and last at least 7 days - been that way for years - she has been taking the iron every other day for her iron def anemia - tolerating ok  History is obtained by patient.  Review of Systems  Neurological:       Increased burning in feet    Patient Active Problem List   Diagnosis Date Noted  . Elevated LDL cholesterol level 06/20/2017  . Acquired hypothyroidism 06/20/2017  . Diabetic neuropathy ([place]) 06/13/2017  . Stress at home 06/13/2017  . Type 2 diabetes mellitus ([place]) 07/03/2011  . Anxiety disorder 07/03/2011  . Dyspepsia 07/03/2011  . Obesity (BMI 30.0-34.9) 07/03/2011    Current Outpatient Medications on File Prior to Visit  Medication Sig Dispense Refill  . Dulaglutide (TRULICITY) 8.75 IE/3.3IR SOPN Inject 0.75 mg into the skin once a week. 12 pen 0  . levothyroxine (SYNTHROID, LEVOTHROID) 50 MCG tablet TAKE ONE TABLET BY MOUTH DAILY 90 tablet 0  . rosuvastatin (CRESTOR) 10 MG tablet Take 1 tablet (10 mg total) by mouth daily. 90 tablet 3   No current facility-administered medications on file prior to visit.     No Known Allergies  Past Medical History:  Diagnosis Date  . Anxiety   . Arthritis   . Depression   . Diabetes mellitus   . Migraines    Social History   Social History Narrative   Work - desk work - Copy   Divorced - lives with son   Social History   Tobacco Use  . Smoking status: Former Smoker    Packs/day: 1.00    Years: 25.00    Pack years: 25.00    Types: Cigarettes    Last attempt to quit: 06/07/2003    Years since quitting: 14.5  . Smokeless tobacco: Never Used  Substance Use Topics  . Alcohol use: Yes    Comment: a couple glasses wine a week  . Drug use: No   family history includes Diabetes in her maternal grandmother and mother.     Objective:  BP 130/78   Pulse 91   Resp 18   Ht 5' 1.18" (1.554 m)   Wt 172 lb 6.4 oz (78.2 kg)   LMP 12/29/2017   SpO2 100%   BMI 32.38 kg/m  Body mass index is 32.38 kg/m.  Wt Readings from Last 3 Encounters:  01/02/18 172 lb 6.4 oz (78.2 kg)  10/02/17 167 lb (75.8 kg)  09/20/17 169 lb 12.8 oz (77 kg)    Physical Exam  Constitutional: She is oriented to person, place, and time. She appears well-developed and well-nourished.  HENT:  Head: Normocephalic and atraumatic.  Right Ear: External ear normal.  Left Ear: External ear normal.  Eyes: Conjunctivae are normal.  Neck: Normal range of motion.  Cardiovascular: Normal rate, regular rhythm and normal heart sounds.  No murmur heard. Pulmonary/Chest: Effort normal and breath sounds normal.  Neurological: She is alert and oriented to person, place, and time.  Skin: Skin is warm and dry.  Psychiatric: She has a normal mood and affect. Her behavior is normal. Judgment and thought content normal.  Vitals reviewed.   Assessment and Plan :  Tinea cruris - Plan: clotrimazole-betamethasone (LOTRISONE) cream  Type 2 diabetes mellitus with diabetic polyneuropathy, with long-term current use of insulin (HCC) - Plan: CMP14+EGFR, Hemoglobin A1c, metFORMIN (GLUCOPHAGE) 1000 MG tablet, lisinopril (PRINIVIL,ZESTRIL) 5 MG tablet - check labd adjust dose of trulicity as needed - may need to add jardiance  Acquired hypothyroidism - Plan: TSH - check labs adjust medications  as needed  Diabetic polyneuropathy associated with type 2 diabetes mellitus ([place]) - Plan: gabapentin (NEURONTIN) 300 MG capsule - increase dose to '900mg'$  tid  Generalized anxiety disorder - Plan: escitalopram (LEXAPRO) 20 MG tablet, LORazepam (ATIVAN) 1 MG tablet - pt is still having a lot of stress at home and needing daily to bid dosing of ativan - we will increase her dose of lexapro to see if we can get that usage to decrease - may need to start buspar to help with her anxiety  Stress at home - Plan: escitalopram (LEXAPRO) 20 MG tablet, LORazepam (ATIVAN) 1 MG tablet  Menometrorrhagia - Plan: Ambulatory referral to Gynecology - suspect this may be part of her iron def - consider ablation with gyn  Iron deficiency anemia due to chronic blood loss - Plan: Iron, TIBC and Ferritin Panel, CBC with Differential/Platelet, Ambulatory referral to Gynecology  Check labs - continue iron oral may need an increase or consider IV infusion depending on change in levels  Elevated LDL cholesterol level - Plan: Lipid panel - check labs fasting today  Patient verbalized to me that they understand the following: diagnosis, what is being done for them, what to expect and what should be done at home.  Their questions have been answered.  See after visit summary for patient specific instructions.  Windell Hummingbird PA-C  Primary Care at [facility] 01/02/2018 9:18 AM  Please note: Portions of this report may have been transcribed using dragon voice recognition software. Every effort was made to ensure accuracy; however, inadvertent computerized transcription errors may be present.

## 2018-01-03 LAB — CBC WITH DIFFERENTIAL/PLATELET
Basophils Absolute: 0 10*3/uL (ref 0.0–0.2)
Basos: 1 %
EOS (ABSOLUTE): 0.1 10*3/uL (ref 0.0–0.4)
Eos: 3 %
Hematocrit: 35.6 % (ref 34.0–46.6)
Hemoglobin: 11.4 g/dL (ref 11.1–15.9)
Immature Grans (Abs): 0 10*3/uL (ref 0.0–0.1)
Immature Granulocytes: 0 %
Lymphocytes Absolute: 1.1 10*3/uL (ref 0.7–3.1)
Lymphs: 24 %
MCH: 28.1 pg (ref 26.6–33.0)
MCHC: 32 g/dL (ref 31.5–35.7)
MCV: 88 fL (ref 79–97)
Monocytes Absolute: 0.3 10*3/uL (ref 0.1–0.9)
Monocytes: 6 %
Neutrophils Absolute: 3.1 10*3/uL (ref 1.4–7.0)
Neutrophils: 66 %
Platelets: 208 10*3/uL (ref 150–450)
RBC: 4.06 x10E6/uL (ref 3.77–5.28)
RDW: 13.7 % (ref 12.3–15.4)
WBC: 4.7 10*3/uL (ref 3.4–10.8)

## 2018-01-03 LAB — IRON,TIBC AND FERRITIN PANEL
Ferritin: 16 ng/mL (ref 15–150)
Iron Saturation: 10 % — ABNORMAL LOW (ref 15–55)
Iron: 34 ug/dL (ref 27–159)
Total Iron Binding Capacity: 329 ug/dL (ref 250–450)
UIBC: 295 ug/dL (ref 131–425)

## 2018-01-03 LAB — CMP14+EGFR
ALBUMIN: 3.8 g/dL (ref 3.5–5.5)
ALK PHOS: 60 IU/L (ref 39–117)
ALT: 10 IU/L (ref 0–32)
AST: 9 IU/L (ref 0–40)
Albumin/Globulin Ratio: 1.4 (ref 1.2–2.2)
BUN/Creatinine Ratio: 15 (ref 9–23)
BUN: 10 mg/dL (ref 6–24)
Bilirubin Total: <0.2 mg/dL (ref 0.0–1.2)
CALCIUM: 9.6 mg/dL (ref 8.7–10.2)
CO2: 22 mmol/L (ref 20–29)
CREATININE: 0.68 mg/dL (ref 0.57–1.00)
Chloride: 103 mmol/L (ref 96–106)
GFR calc Af Amer: 114 mL/min/{1.73_m2} (ref >59)
GFR calc non Af Amer: 99 mL/min/{1.73_m2} (ref >59)
GLUCOSE: 277 mg/dL — AB (ref 65–99)
Globulin, Total: 2.7 g/dL (ref 1.5–4.5)
Potassium: 4.3 mmol/L (ref 3.5–5.2)
Sodium: 137 mmol/L (ref 134–144)
TOTAL PROTEIN: 6.5 g/dL (ref 6.0–8.5)

## 2018-01-03 LAB — LIPID PANEL
CHOL/HDL RATIO: 4.5 ratio — AB (ref 0.0–4.4)
Cholesterol, Total: 180 mg/dL (ref 100–199)
HDL: 40 mg/dL (ref >39)
LDL CALC: 122 mg/dL — AB (ref 0–99)
TRIGLYCERIDES: 90 mg/dL (ref 0–149)
VLDL CHOLESTEROL CAL: 18 mg/dL (ref 5–40)

## 2018-01-03 LAB — HEMOGLOBIN A1C
ESTIMATED AVERAGE GLUCOSE: 249 mg/dL
HEMOGLOBIN A1C: 10.3 % — AB (ref 4.8–5.6)

## 2018-01-03 LAB — TSH: TSH: 2.98 u[IU]/mL (ref 0.450–4.500)

## 2018-01-06 ENCOUNTER — Encounter: Payer: Self-pay | Admitting: Physician Assistant

## 2018-01-07 NOTE — Telephone Encounter (Signed)
Can we look for this form?

## 2018-01-16 NOTE — Telephone Encounter (Signed)
I found the form that needs to be filled out by the provider. I am placing the form in Dr. Adela GlimpseSantiago's box and routing the message to her.

## 2018-01-20 NOTE — Telephone Encounter (Signed)
Form signed. Please finish form and fax thanks

## 2018-01-22 ENCOUNTER — Other Ambulatory Visit: Payer: Self-pay

## 2018-01-22 ENCOUNTER — Encounter: Payer: BLUE CROSS/BLUE SHIELD | Admitting: Obstetrics and Gynecology

## 2018-01-22 ENCOUNTER — Encounter: Payer: Self-pay | Admitting: Obstetrics and Gynecology

## 2018-01-22 NOTE — Progress Notes (Signed)
Patient left without being seen due to provider illness. Visit rescheduled to 01-26-18.

## 2018-01-26 ENCOUNTER — Other Ambulatory Visit (HOSPITAL_COMMUNITY)
Admission: RE | Admit: 2018-01-26 | Discharge: 2018-01-26 | Disposition: A | Discharge status: Home / Self Care | Payer: BLUE CROSS/BLUE SHIELD | Source: Ambulatory Visit | Attending: Obstetrics and Gynecology | Admitting: Obstetrics and Gynecology

## 2018-01-26 ENCOUNTER — Other Ambulatory Visit: Payer: Self-pay

## 2018-01-26 ENCOUNTER — Encounter: Payer: Self-pay | Admitting: Obstetrics and Gynecology

## 2018-01-26 ENCOUNTER — Ambulatory Visit (INDEPENDENT_AMBULATORY_CARE_PROVIDER_SITE_OTHER): Payer: BLUE CROSS/BLUE SHIELD | Admitting: Obstetrics and Gynecology

## 2018-01-26 VITALS — BP 138/74 | HR 68 | Ht 61.75 in | Wt 173.0 lb

## 2018-01-26 DIAGNOSIS — Z01419 Encounter for gynecological examination (general) (routine) without abnormal findings: Secondary | ICD-10-CM | POA: Diagnosis not present

## 2018-01-26 DIAGNOSIS — N920 Excessive and frequent menstruation with regular cycle: Secondary | ICD-10-CM | POA: Diagnosis not present

## 2018-01-26 DIAGNOSIS — N946 Dysmenorrhea, unspecified: Secondary | ICD-10-CM

## 2018-01-26 DIAGNOSIS — Z124 Encounter for screening for malignant neoplasm of cervix: Secondary | ICD-10-CM | POA: Insufficient documentation

## 2018-01-26 DIAGNOSIS — E039 Hypothyroidism, unspecified: Secondary | ICD-10-CM | POA: Insufficient documentation

## 2018-01-26 DIAGNOSIS — N6312 Unspecified lump in the right breast, upper inner quadrant: Secondary | ICD-10-CM

## 2018-01-26 NOTE — Addendum Note (Signed)
Addended by: Tobi BastosJERTSON, Kaelene Elliston E on: 01/26/2018 05:01 PM   Modules accepted: Orders

## 2018-01-26 NOTE — Progress Notes (Signed)
57 y.o. G1P1 Divorced White or Caucasian Not Hispanic or Latino female here for annual exam.   Period Cycle (Days): 30 Period Duration (Days): 7 Period Pattern: Regular Menstrual Flow: Heavy Menstrual Control: Tampon, Maxi pad Menstrual Control Change Freq (Hours): every 30 minutes in the beginning then about the 4th day it slows down Dysmenorrhea: (!) Severe Dysmenorrhea Symptoms: Cramping  She is saturating a super + tampon in 30 minutes for one day, stays heavy for 3 days. She sometimes gets light headed or dizzy. Cramps have always been severe. She takes ibuprofen uses a heating pad. Not sexually active.   She has DM, HgbA1C 10.3, just started on medication. She had bad insurance.   Patient's last menstrual period was 01/09/2018.          Sexually active: No.  The current method of family planning is none.    Exercising: No.  The patient does not participate in regular exercise at present. Smoker:  Former smoker, quit 15 years ago  Health Maintenance: Pap:  5 years ago History of abnormal Pap:  no MMG:  5 years ago BMD:   never Colonoscopy: never, will do cologuard with her primary. TDaP:  2013 Gardasil: none   reports that she quit smoking about 14 years ago. Her smoking use included cigarettes. She has a 25.00 pack-year smoking history. She has never used smokeless tobacco. She reports that she drinks alcohol. She reports that she does not use drugs. works in Programmer, [employer] insurance. Son is 16, Holiday representativejunior. Lives with her.   Past Medical History:  Diagnosis Date  . Anxiety   . Arthritis   . Depression   . Diabetes mellitus   . Migraines     Past Surgical History:  Procedure Laterality Date  . CESAREAN SECTION    . TUBAL LIGATION      Current Outpatient Medications  Medication Sig Dispense Refill  . clotrimazole-betamethasone (LOTRISONE) cream Apply 1 application topically 2 (two) times daily. 30 g 0  . Dulaglutide (TRULICITY) 0.75 MG/0.5ML SOPN Inject 0.75 mg into the skin  once a week. 12 pen 0  . escitalopram (LEXAPRO) 20 MG tablet Take 1 tablet (20 mg total) by mouth daily. 90 tablet 1  . gabapentin (NEURONTIN) 300 MG capsule Take 3 capsules (900 mg total) by mouth 3 (three) times daily. 270 capsule 3  . levothyroxine (SYNTHROID, LEVOTHROID) 50 MCG tablet TAKE ONE TABLET BY MOUTH DAILY 90 tablet 0  . lisinopril (PRINIVIL,ZESTRIL) 5 MG tablet Take 1 tablet (5 mg total) by mouth daily. 90 tablet 3  . LORazepam (ATIVAN) 1 MG tablet TAKE ONE TABLET BY MOUTH TWICE A DAY FOR ANXIETY 60 tablet 0  . metFORMIN (GLUCOPHAGE) 1000 MG tablet TAKE ONE TABLET BY MOUTH TWICE A DAY WITH A MEAL 180 tablet 3  . rosuvastatin (CRESTOR) 10 MG tablet Take 1 tablet (10 mg total) by mouth daily. 90 tablet 3   No current facility-administered medications for this visit.     Family History  Problem Relation Age of Onset  . Diabetes Mother   . Diabetes Sister   . Hypertension Sister   . Diabetes Maternal Grandmother     Review of Systems  Constitutional: Negative.   HENT: Negative.   Eyes: Negative.   Respiratory: Negative.   Cardiovascular: Negative.   Gastrointestinal: Negative.   Endocrine: Negative.   Genitourinary: Positive for menstrual problem.  Musculoskeletal: Negative.   Skin: Negative.   Allergic/Immunologic: Negative.   Neurological: Negative.   Hematological: Negative.   Psychiatric/Behavioral:  Negative.   All other systems reviewed and are negative.   Exam:   BP 138/74   Pulse 68   Ht 5' 1.75" (1.568 m)   Wt 173 lb (78.5 kg)   LMP 01/09/2018   BMI 31.90 kg/m   Weight change: @WEIGHTCHANGE @ Height:   Height: 5' 1.75" (156.8 cm)  Ht Readings from Last 3 Encounters:  01/26/18 5' 1.75" (1.568 m)  01/22/18 5' 1.75" (1.568 m)  01/02/18 5' 1.18" (1.554 m)    General appearance: alert, cooperative and appears stated age Head: Normocephalic, without obvious abnormality, atraumatic Neck: no adenopathy, supple, symmetrical, trachea midline and thyroid  normal to inspection and palpation Lungs: clear to auscultation bilaterally Cardiovascular: regular rate and rhythm Breasts: in the right breast at 2 o'clock a few cm from the areolar region is a 1.5 cm bean shaped lump, not tender. No other lumps or skin changes. Abdomen: soft, non-tender; non distended,  no masses,  no organomegaly Extremities: extremities normal, atraumatic, no cyanosis or edema Skin: Skin color, texture, turgor normal. No rashes or lesions Lymph nodes: Cervical, supraclavicular, and axillary nodes normal. No abnormal inguinal nodes palpated Neurologic: Grossly normal   Pelvic: External genitalia:  no lesions              Urethra:  normal appearing urethra with no masses, tenderness or lesions              Bartholins and Skenes: normal                 Vagina: normal appearing vagina with normal color and discharge, no lesions              Cervix: no lesions               Bimanual Exam:  Uterus:  normal size, contour, position, consistency, mobility, non-tender and anteverted              Adnexa: no mass, fullness, tenderness               Rectovaginal: Confirms               Anus:  normal sphincter tone, no lesions  Chaperone was present for exam.  A:  Well Woman with normal exam  Menorrhagia, leading to mild anemia (Hgb 10.6 in 4/19, improved with iron)   Right breast lump  Dysmenorrhea, no change, tolerable  P:   Pap with hpv  Return for ultrasound, possible sonohysterogram, possible endometrial biopsy  Information on the mirena IUD given  Diagnostic breast imaging  Cologuard with her primary  Discussed breast self exam

## 2018-01-26 NOTE — Progress Notes (Signed)
Patient scheduled while in office for bilateral diagnostic MMG and right breast US, if needed. Scheduled at [facility] on 01/29/18 at 8:40am, arriving at 8:20am. Patient verbalizes understanding and is agreeable.

## 2018-01-26 NOTE — Patient Instructions (Signed)
Mammogram Facilities  Yearly screening mammograms are recommended for women beginning at age 55. For a routine screening mammogram, you may schedule the appointment and have it done at the location of your choice.  Please ask the facility to send the results to our office. (fax [phone]) Location options include:  [facility] [place], [place], [place] [address] [phone]  [facility] [address], [place], [place] [address] [phone] EXERCISE AND DIET:  We recommended that you start or continue a regular exercise program for good health. Regular exercise means any activity that makes your heart beat faster and makes you sweat.  We recommend exercising at least 30 minutes per day at least 3 days a week, preferably 4 or 5.  We also recommend a diet low in fat and sugar.  Inactivity, poor dietary choices and obesity can cause diabetes, heart attack, stroke, and kidney damage, among others.    ALCOHOL AND SMOKING:  Women should limit their alcohol intake to no more than 7 drinks/beers/glasses of wine (combined, not each!) per week. Moderation of alcohol intake to this level decreases your risk of breast cancer and liver damage. And of course, no recreational drugs are part of a healthy lifestyle.  And absolutely no smoking or even second hand smoke. Most people know smoking can cause heart and lung diseases, but did you know it also contributes to weakening of your bones? Aging of your skin?  Yellowing of your teeth and nails?  CALCIUM AND VITAMIN D:  Adequate intake of calcium and Vitamin D are recommended.  The recommendations for exact amounts of these supplements seem to change often, but generally speaking 600 mg of calcium (either carbonate or citrate) and 800 units of Vitamin D per day seems prudent. Certain women may benefit from higher intake of Vitamin D.  If you are among these women, your doctor will have told you  during your visit.    PAP SMEARS:  Pap smears, to check for cervical cancer or precancers,  have traditionally been done yearly, although recent scientific advances have shown that most women can have pap smears less often.  However, every woman still should have a physical exam from her gynecologist every year. It will include a breast check, inspection of the vulva and vagina to check for abnormal growths or skin changes, a visual exam of the cervix, and then an exam to evaluate the size and shape of the uterus and ovaries.  And after 55 years of age, a rectal exam is indicated to check for rectal cancers. We will also provide age appropriate advice regarding health maintenance, like when you should have certain vaccines, screening for sexually transmitted diseases, bone density testing, colonoscopy, mammograms, etc.   MAMMOGRAMS:  All women over 66 years old should have a yearly mammogram. Many facilities now offer a "3D" mammogram, which may cost around $50 extra out of pocket. If possible,  we recommend you accept the option to have the 3D mammogram performed.  It both reduces the number of women who will be called back for extra views which then turn out to be normal, and it is better than the routine mammogram at detecting truly abnormal areas.    COLONOSCOPY:  Colonoscopy to screen for colon cancer is recommended for all women at age 16.  We know, you hate the idea of the prep.  We agree, BUT, having colon cancer and not knowing it is worse!!  Colon  cancer so often starts as a polyp that can be seen and removed at colonscopy, which can quite literally save your life!  And if your first colonoscopy is normal and you have no family history of colon cancer, most women don't have to have it again for 10 years.  Once every ten years, you can do something that may end up saving your life, right?  We will be happy to help you get it scheduled when you are ready.  Be sure to check your insurance coverage so  you understand how much it will cost.  It may be covered as a preventative service at no cost, but you should check your particular policy.

## 2018-01-27 ENCOUNTER — Other Ambulatory Visit: Payer: Self-pay | Admitting: Physician Assistant

## 2018-01-27 NOTE — Telephone Encounter (Signed)
Copied from CRM [id number]. Topic: General - Other >> Jan 27, 2018  1:07 PM Ruth LoopLander, Ruth Mayo wrote: Reason for CRM: [facility] told the patient that PA Ruth LennertSarah Mayo is supposed to fill out medication refill papers that were sent via mail around 01/13/2018 and fax it back to them. Patient wants to know if this was received.

## 2018-01-28 LAB — CYTOLOGY - PAP
Diagnosis: NEGATIVE
HPV: NOT DETECTED

## 2018-01-28 NOTE — Telephone Encounter (Signed)
Advised pt Trulicity has arrived at office.  Pt to pick up meds at [facility] [address] on 09/26.

## 2018-01-28 NOTE — Telephone Encounter (Signed)
Please advise 

## 2018-01-29 ENCOUNTER — Ambulatory Visit
Admission: RE | Admit: 2018-01-29 | Discharge: 2018-01-29 | Disposition: A | Discharge status: Home / Self Care | Payer: BLUE CROSS/BLUE SHIELD | Source: Ambulatory Visit | Attending: Obstetrics and Gynecology | Admitting: Obstetrics and Gynecology

## 2018-01-29 DIAGNOSIS — N6312 Unspecified lump in the right breast, upper inner quadrant: Secondary | ICD-10-CM

## 2018-01-29 DIAGNOSIS — R928 Other abnormal and inconclusive findings on diagnostic imaging of breast: Secondary | ICD-10-CM | POA: Diagnosis not present

## 2018-01-29 DIAGNOSIS — N6011 Diffuse cystic mastopathy of right breast: Secondary | ICD-10-CM | POA: Diagnosis not present

## 2018-02-02 NOTE — Progress Notes (Deleted)
GYNECOLOGY  VISIT   HPI: 57 y.o.   Divorced White or Caucasian Not Hispanic or Latino  female   G1P1 with Patient's last menstrual period was 01/09/2018.   here for consult after PUS/SHGM and EMB.   GYNECOLOGIC HISTORY: Patient's last menstrual period was 01/09/2018. Contraception: None Menopausal hormone therapy: None        OB History    Gravida  1   Para  1   Term      Preterm      AB      Living        SAB      TAB      Ectopic      Multiple      Live Births                 Patient Active Problem List   Diagnosis Date Noted  . Hypothyroidism   . Elevated LDL cholesterol level 06/20/2017  . Acquired hypothyroidism 06/20/2017  . Diabetic neuropathy (HCC) 06/13/2017  . Stress at home 06/13/2017  . Type 2 diabetes mellitus (HCC) 07/03/2011  . Anxiety disorder 07/03/2011  . Dyspepsia 07/03/2011  . Obesity (BMI 30.0-34.9) 07/03/2011    Past Medical History:  Diagnosis Date  . Anxiety   . Arthritis   . Depression   . Diabetes mellitus   . History of iron deficiency anemia   . Hypothyroidism   . Migraines     Past Surgical History:  Procedure Laterality Date  . CESAREAN SECTION    . TUBAL LIGATION      Current Outpatient Medications  Medication Sig Dispense Refill  . clotrimazole-betamethasone (LOTRISONE) cream Apply 1 application topically 2 (two) times daily. 30 g 0  . Dulaglutide (TRULICITY) 0.75 MG/0.5ML SOPN Inject 0.75 mg into the skin once a week. 12 pen 0  . escitalopram (LEXAPRO) 20 MG tablet Take 1 tablet (20 mg total) by mouth daily. 90 tablet 1  . gabapentin (NEURONTIN) 300 MG capsule Take 3 capsules (900 mg total) by mouth 3 (three) times daily. 270 capsule 3  . levothyroxine (SYNTHROID, LEVOTHROID) 50 MCG tablet TAKE ONE TABLET BY MOUTH DAILY 90 tablet 0  . lisinopril (PRINIVIL,ZESTRIL) 5 MG tablet Take 1 tablet (5 mg total) by mouth daily. 90 tablet 3  . LORazepam (ATIVAN) 1 MG tablet TAKE ONE TABLET BY MOUTH TWICE A DAY FOR  ANXIETY 60 tablet 0  . metFORMIN (GLUCOPHAGE) 1000 MG tablet TAKE ONE TABLET BY MOUTH TWICE A DAY WITH A MEAL 180 tablet 3  . rosuvastatin (CRESTOR) 10 MG tablet Take 1 tablet (10 mg total) by mouth daily. 90 tablet 3   No current facility-administered medications for this visit.      ALLERGIES: Patient has no known allergies.  Family History  Problem Relation Age of Onset  . Diabetes Mother   . Diabetes Sister   . Hypertension Sister   . Thyroid disease Sister   . Diabetes Maternal Grandmother     Social History   Socioeconomic History  . Marital status: Divorced    Spouse name: Tammy Sours  . Number of children: 1  . Years of education: Not on file  . Highest education level: Not on file  Occupational History  . Not on file  Social Needs  . Financial resource strain: Not on file  . Food insecurity:    Worry: Not on file    Inability: Not on file  . Transportation needs:    Medical: Not on file  Non-medical: Not on file  Tobacco Use  . Smoking status: Former Smoker    Packs/day: 1.00    Years: 25.00    Pack years: 25.00    Types: Cigarettes    Last attempt to quit: 06/07/2003    Years since quitting: 14.6  . Smokeless tobacco: Never Used  Substance and Sexual Activity  . Alcohol use: Yes    Comment: a couple glasses wine a week  . Drug use: No  . Sexual activity: Not Currently    Birth control/protection: Surgical    Comment: BTL   Lifestyle  . Physical activity:    Days per week: Not on file    Minutes per session: Not on file  . Stress: Not on file  Relationships  . Social connections:    Talks on phone: Not on file    Gets together: Not on file    Attends religious service: Not on file    Active member of club or organization: Not on file    Attends meetings of clubs or organizations: Not on file    Relationship status: Not on file  . Intimate partner violence:    Fear of current or ex partner: Not on file    Emotionally abused: Not on file     Physically abused: Not on file    Forced sexual activity: Not on file  Other Topics Concern  . Not on file  Social History Narrative   Work - desk work - Location manager   Divorced - lives with son    Review of Systems  PHYSICAL EXAMINATION:    LMP 01/09/2018     General appearance: alert, cooperative and appears stated age Neck: no adenopathy, supple, symmetrical, trachea midline and thyroid {CHL AMB PHY EX THYROID NORM DEFAULT:[id number]::"normal to inspection and palpation"} Breasts: {Exam; breast:13139::"normal appearance, no masses or tenderness"} Abdomen: soft, non-tender; non distended, no masses,  no organomegaly  Pelvic: External genitalia:  no lesions              Urethra:  normal appearing urethra with no masses, tenderness or lesions              Bartholins and Skenes: normal                 Vagina: normal appearing vagina with normal color and discharge, no lesions              Cervix: {CHL AMB PHY EX CERVIX NORM DEFAULT:[phone]::"no lesions"}              Bimanual Exam:  Uterus:  {CHL AMB PHY EX UTERUS NORM [phone]::"normal size, contour, position, consistency, mobility, non-tender"}              Adnexa: {CHL AMB PHY EX ADNEXA NO MASS [phone]::"no mass, fullness, tenderness"}              Rectovaginal: {yes no:[id number]}.  Confirms.              Anus:  normal sphincter tone, no lesions  Chaperone was present for exam.  ASSESSMENT     PLAN    An After Visit Summary was printed and given to the patient.  *** minutes face to face time of which over 50% was spent in counseling.

## 2018-02-03 ENCOUNTER — Ambulatory Visit (INDEPENDENT_AMBULATORY_CARE_PROVIDER_SITE_OTHER): Payer: BLUE CROSS/BLUE SHIELD

## 2018-02-03 ENCOUNTER — Other Ambulatory Visit: Payer: BLUE CROSS/BLUE SHIELD | Admitting: Obstetrics and Gynecology

## 2018-02-03 ENCOUNTER — Ambulatory Visit: Payer: BLUE CROSS/BLUE SHIELD | Admitting: Obstetrics & Gynecology

## 2018-02-03 ENCOUNTER — Other Ambulatory Visit: Payer: Self-pay | Admitting: Obstetrics & Gynecology

## 2018-02-03 ENCOUNTER — Other Ambulatory Visit: Payer: Self-pay | Admitting: *Deleted

## 2018-02-03 ENCOUNTER — Ambulatory Visit: Payer: BLUE CROSS/BLUE SHIELD

## 2018-02-03 VITALS — BP 120/70 | HR 88 | Resp 16 | Ht 61.75 in | Wt 172.8 lb

## 2018-02-03 DIAGNOSIS — N920 Excessive and frequent menstruation with regular cycle: Secondary | ICD-10-CM

## 2018-02-03 DIAGNOSIS — N946 Dysmenorrhea, unspecified: Secondary | ICD-10-CM

## 2018-02-03 DIAGNOSIS — N858 Other specified noninflammatory disorders of uterus: Secondary | ICD-10-CM | POA: Diagnosis not present

## 2018-02-03 NOTE — Progress Notes (Signed)
57 y.o. Divorced [place] American female here for a pelvic ultrasound with possible sonohystogram due to menorrhagia.  She was to see Dr. Oscar La today but she is delayed in surgery so I will complete procedure for her today and then Dr. Oscar La will proceed with making treatment recommendations.    Patient's last menstrual period was 01/09/2018.  Contraception:  Not SA  FINDINGS: Uterus: 8.1 x 5 x 5cm Endometrium: 3.106mm Adnexa:  Left: 2.7 x 1.1 x 1.7cm, appears atrophic     Right:  3.4 x 1.9 x 1.0cm Cul de sac: no free fluid  As endometrium is thin and atrophic, do not feel SGHM is warranted.  I do recommend proceeding with biopsy as ovaries appear atrophic.  Recommend FSH as well  Endometrial biopsy recommended. Verbal and written consent obtained.  Speculum placed.  Vaginal tissue is atrophic as well.  Cervix visualized and cleansed with betadine prep.  A single toothed tenaculum was applied to the anterior lip of the cervix.  Endometrial pipelle was advanced through the cervix into the endometrial cavity without difficulty.  Pipelle passed to 7cm.  Suction applied and pipelle removed with good tissue sample obtained.  Tenculum removed.  No bleeding noted.  Patient tolerated procedure well.  All instruments removed.  Assessment: Menorrhagia with regular cycle Atrophic ovaries and vaginal findings on exam Thin, symmetric endometrium  Plan:   FSH will be obtained today Endometrial biopsy results will be communicated to pt.   Dr. Oscar La will make further recommendations after results are finalized.

## 2018-02-04 LAB — FOLLICLE STIMULATING HORMONE: FSH: 44.3 m[IU]/mL

## 2018-02-05 ENCOUNTER — Encounter: Payer: Self-pay | Admitting: Obstetrics & Gynecology

## 2018-02-09 ENCOUNTER — Telehealth: Payer: Self-pay | Admitting: Emergency Medicine

## 2018-02-09 NOTE — Telephone Encounter (Signed)
-----   Message from Romualdo Bolk, MD sent at 01/29/2018  5:19 PM EDT ----- Negative imaging. Please have her return for a 3 month breast check, she should do monthly breast self exams and call with any concerns.  Out of mammogram hold

## 2018-02-09 NOTE — Telephone Encounter (Signed)
Spoke with patient and both messages from Dr. Oscar La given.  She agrees to surgical options appointment and this is scheduled for her on 02/17/18. She would like to plan for surgery in January, possibly.  Declines to schedule breast check at this time, she states she cannot schedule any future appointments due to her "busy season" at work is approaching and declines to schedule.  Note made on upcoming appointment for 02/17/18 for breast check as patient declines to schedule earlier check.   Routing to Dr. Oscar La and will close encounter.

## 2018-02-09 NOTE — Telephone Encounter (Signed)
-----   Message from Romualdo Bolk, MD sent at 02/06/2018  3:07 PM EDT ----- Please let the patient know that her endometrial biopsy returned with atrophy, her FSH is in a menopausal range (can go up and down in peri-menopause). We previously discussed the option of a mirena IUD, this can help with the menopause transition. If she wants an IUD, you can schedule it. I got the impression from Dr Hyacinth Meeker that she was interested in hysterectomy. If she doesn't want the IUD please schedule her an appointment with me to discuss her options.

## 2018-02-17 ENCOUNTER — Ambulatory Visit: Payer: Self-pay | Admitting: Obstetrics and Gynecology

## 2018-02-17 ENCOUNTER — Ambulatory Visit: Payer: BLUE CROSS/BLUE SHIELD | Admitting: Obstetrics and Gynecology

## 2018-02-25 NOTE — Progress Notes (Deleted)
GYNECOLOGY  VISIT   HPI: 57 y.o.   Divorced White or Caucasian Not Hispanic or Latino  female   G1P1 with No LMP recorded.   here for consult regarding surgery options and right breast recheck.    GYNECOLOGIC HISTORY: No LMP recorded. Contraception:*** Menopausal hormone therapy: None        OB History    Gravida  1   Para  1   Term      Preterm      AB      Living        SAB      TAB      Ectopic      Multiple      Live Births                 Patient Active Problem List   Diagnosis Date Noted  . Hypothyroidism   . Elevated LDL cholesterol level 06/20/2017  . Acquired hypothyroidism 06/20/2017  . Diabetic neuropathy (HCC) 06/13/2017  . Stress at home 06/13/2017  . Type 2 diabetes mellitus (HCC) 07/03/2011  . Anxiety disorder 07/03/2011  . Dyspepsia 07/03/2011  . Obesity (BMI 30.0-34.9) 07/03/2011    Past Medical History:  Diagnosis Date  . Anxiety   . Arthritis   . Depression   . Diabetes mellitus   . History of iron deficiency anemia   . Hypothyroidism   . Migraines     Past Surgical History:  Procedure Laterality Date  . CESAREAN SECTION    . TUBAL LIGATION      Current Outpatient Medications  Medication Sig Dispense Refill  . clotrimazole-betamethasone (LOTRISONE) cream Apply 1 application topically 2 (two) times daily. 30 g 0  . Dulaglutide (TRULICITY) 0.75 MG/0.5ML SOPN Inject 0.75 mg into the skin once a week. 12 pen 0  . escitalopram (LEXAPRO) 20 MG tablet Take 1 tablet (20 mg total) by mouth daily. 90 tablet 1  . gabapentin (NEURONTIN) 300 MG capsule Take 3 capsules (900 mg total) by mouth 3 (three) times daily. 270 capsule 3  . levothyroxine (SYNTHROID, LEVOTHROID) 50 MCG tablet TAKE ONE TABLET BY MOUTH DAILY 90 tablet 0  . lisinopril (PRINIVIL,ZESTRIL) 5 MG tablet Take 1 tablet (5 mg total) by mouth daily. 90 tablet 3  . LORazepam (ATIVAN) 1 MG tablet TAKE ONE TABLET BY MOUTH TWICE A DAY FOR ANXIETY 60 tablet 0  . metFORMIN  (GLUCOPHAGE) 1000 MG tablet TAKE ONE TABLET BY MOUTH TWICE A DAY WITH A MEAL 180 tablet 3  . rosuvastatin (CRESTOR) 10 MG tablet Take 1 tablet (10 mg total) by mouth daily. 90 tablet 3   No current facility-administered medications for this visit.      ALLERGIES: Patient has no known allergies.  Family History  Problem Relation Age of Onset  . Diabetes Mother   . Diabetes Sister   . Hypertension Sister   . Thyroid disease Sister   . Diabetes Maternal Grandmother     Social History   Socioeconomic History  . Marital status: Divorced    Spouse name: Tammy Sours  . Number of children: 1  . Years of education: Not on file  . Highest education level: Not on file  Occupational History  . Not on file  Social Needs  . Financial resource strain: Not on file  . Food insecurity:    Worry: Not on file    Inability: Not on file  . Transportation needs:    Medical: Not on file    Non-medical:  Not on file  Tobacco Use  . Smoking status: Former Smoker    Packs/day: 1.00    Years: 25.00    Pack years: 25.00    Types: Cigarettes    Last attempt to quit: 06/07/2003    Years since quitting: 14.7  . Smokeless tobacco: Never Used  Substance and Sexual Activity  . Alcohol use: Yes    Comment: a couple glasses wine a week  . Drug use: No  . Sexual activity: Not Currently    Birth control/protection: Surgical    Comment: BTL   Lifestyle  . Physical activity:    Days per week: Not on file    Minutes per session: Not on file  . Stress: Not on file  Relationships  . Social connections:    Talks on phone: Not on file    Gets together: Not on file    Attends religious service: Not on file    Active member of club or organization: Not on file    Attends meetings of clubs or organizations: Not on file    Relationship status: Not on file  . Intimate partner violence:    Fear of current or ex partner: Not on file    Emotionally abused: Not on file    Physically abused: Not on file    Forced  sexual activity: Not on file  Other Topics Concern  . Not on file  Social History Narrative   Work - desk work - Location manager   Divorced - lives with son    ROS  PHYSICAL EXAMINATION:    There were no vitals taken for this visit.    General appearance: alert, cooperative and appears stated age Neck: no adenopathy, supple, symmetrical, trachea midline and thyroid {CHL AMB PHY EX THYROID NORM DEFAULT:[phone]::"normal to inspection and palpation"} Breasts: {Exam; breast:13139::"normal appearance, no masses or tenderness"} Abdomen: soft, non-tender; non distended, no masses,  no organomegaly  Pelvic: External genitalia:  no lesions              Urethra:  normal appearing urethra with no masses, tenderness or lesions              Bartholins and Skenes: normal                 Vagina: normal appearing vagina with normal color and discharge, no lesions              Cervix: {CHL AMB PHY EX CERVIX NORM DEFAULT:[phone]::"no lesions"}              Bimanual Exam:  Uterus:  {CHL AMB PHY EX UTERUS NORM DEFAULT:[phone]::"normal size, contour, position, consistency, mobility, non-tender"}              Adnexa: {CHL AMB PHY EX ADNEXA NO MASS DEFAULT:[phone]::"no mass, fullness, tenderness"}              Rectovaginal: {yes no:314532}.  Confirms.              Anus:  normal sphincter tone, no lesions  Chaperone was present for exam.  ASSESSMENT     PLAN    An After Visit Summary was printed and given to the patient.  *** minutes face to face time of which over 50% was spent in counseling.

## 2018-02-27 ENCOUNTER — Telehealth: Payer: Self-pay | Admitting: Obstetrics and Gynecology

## 2018-02-27 ENCOUNTER — Ambulatory Visit: Payer: Self-pay | Admitting: Obstetrics and Gynecology

## 2018-02-27 NOTE — Telephone Encounter (Signed)
Patient cancelled her consult visit today because she is closing on her house. Rescheduled to Monday at 11:30.

## 2018-03-02 ENCOUNTER — Ambulatory Visit: Payer: Self-pay | Admitting: Obstetrics and Gynecology

## 2018-03-02 ENCOUNTER — Telehealth: Payer: Self-pay | Admitting: Obstetrics and Gynecology

## 2018-03-02 NOTE — Telephone Encounter (Signed)
Patient called and cancelled her today to discuss surgery and for a breast recheck appointment today with Dr. Oscar La due to "up all night with a stomach virus." She rescheduled to Wednesday, 03/04/18 at 10:30 AM.

## 2018-03-03 NOTE — Progress Notes (Signed)
GYNECOLOGY  VISIT   HPI: 57 y.o.   Divorced White or Caucasian Not Hispanic or Latino  female   G1P1 with Patient's last menstrual period was 01/28/2018 (exact date).   here for surgery consult. The patient was seen last month for an annual, c/o menorrhagia. FSH 44, normal pap, atrophic biopsy, ultrasound with likely adenomyosis. The patient was recently started on medication for her diabetes (was without insurance). Her last HgbA1C was 10.3 on 01/02/18  She is due for a cycle, cycles every month x 5 days. Very heavy x 3 days, can go through a pad in 30 minutes. Normal TSH, normal CBC. Terrible cramps. Not sexually active currently.   GYNECOLOGIC HISTORY: Patient's last menstrual period was 01/28/2018 (exact date). Contraception:BTL Menopausal hormone therapy: None       OB History    Gravida  1   Para  1   Term      Preterm      AB      Living        SAB      TAB      Ectopic      Multiple      Live Births                 Patient Active Problem List   Diagnosis Date Noted  . Hypothyroidism   . Elevated LDL cholesterol level 06/20/2017  . Acquired hypothyroidism 06/20/2017  . Diabetic neuropathy (HCC) 06/13/2017  . Stress at home 06/13/2017  . Type 2 diabetes mellitus (HCC) 07/03/2011  . Anxiety disorder 07/03/2011  . Dyspepsia 07/03/2011  . Obesity (BMI 30.0-34.9) 07/03/2011    Past Medical History:  Diagnosis Date  . Anxiety   . Arthritis   . Depression   . Diabetes mellitus   . History of iron deficiency anemia   . Hypothyroidism   . Migraines     Past Surgical History:  Procedure Laterality Date  . CESAREAN SECTION    . TUBAL LIGATION    C/S and TL at same surgery.   Current Outpatient Medications  Medication Sig Dispense Refill  . clotrimazole-betamethasone (LOTRISONE) cream Apply 1 application topically 2 (two) times daily. 30 g 0  . Dulaglutide (TRULICITY) 0.75 MG/0.5ML SOPN Inject 0.75 mg into the skin once a week. 12 pen 0  .  escitalopram (LEXAPRO) 20 MG tablet Take 1 tablet (20 mg total) by mouth daily. 90 tablet 1  . gabapentin (NEURONTIN) 300 MG capsule Take 3 capsules (900 mg total) by mouth 3 (three) times daily. 270 capsule 3  . levothyroxine (SYNTHROID, LEVOTHROID) 50 MCG tablet TAKE ONE TABLET BY MOUTH DAILY 90 tablet 0  . lisinopril (PRINIVIL,ZESTRIL) 5 MG tablet Take 1 tablet (5 mg total) by mouth daily. 90 tablet 3  . LORazepam (ATIVAN) 1 MG tablet TAKE ONE TABLET BY MOUTH TWICE A DAY FOR ANXIETY 60 tablet 0  . metFORMIN (GLUCOPHAGE) 1000 MG tablet TAKE ONE TABLET BY MOUTH TWICE A DAY WITH A MEAL 180 tablet 3  . rosuvastatin (CRESTOR) 10 MG tablet Take 1 tablet (10 mg total) by mouth daily. 90 tablet 3   No current facility-administered medications for this visit.      ALLERGIES: Patient has no known allergies.  Family History  Problem Relation Age of Onset  . Diabetes Mother   . Diabetes Sister   . Hypertension Sister   . Thyroid disease Sister   . Diabetes Maternal Grandmother     Social History   Socioeconomic History  .  Marital status: Divorced    Spouse name: Tammy Sours  . Number of children: 1  . Years of education: Not on file  . Highest education level: Not on file  Occupational History  . Not on file  Social Needs  . Financial resource strain: Not on file  . Food insecurity:    Worry: Not on file    Inability: Not on file  . Transportation needs:    Medical: Not on file    Non-medical: Not on file  Tobacco Use  . Smoking status: Former Smoker    Packs/day: 1.00    Years: 25.00    Pack years: 25.00    Types: Cigarettes    Last attempt to quit: 06/07/2003    Years since quitting: 14.7  . Smokeless tobacco: Never Used  Substance and Sexual Activity  . Alcohol use: Yes    Comment: a couple glasses wine a week  . Drug use: No  . Sexual activity: Not Currently    Birth control/protection: Surgical    Comment: BTL   Lifestyle  . Physical activity:    Days per week: Not on  file    Minutes per session: Not on file  . Stress: Not on file  Relationships  . Social connections:    Talks on phone: Not on file    Gets together: Not on file    Attends religious service: Not on file    Active member of club or organization: Not on file    Attends meetings of clubs or organizations: Not on file    Relationship status: Not on file  . Intimate partner violence:    Fear of current or ex partner: Not on file    Emotionally abused: Not on file    Physically abused: Not on file    Forced sexual activity: Not on file  Other Topics Concern  . Not on file  Social History Narrative   Work - desk work - Location manager   Divorced - lives with son    Review of Systems  Constitutional: Negative.   HENT: Positive for sinus pain.   Eyes: Negative.   Respiratory: Negative.   Cardiovascular: Negative.   Gastrointestinal: Positive for diarrhea.       Bloating  Genitourinary: Negative.        Excessive bleeding Painful menses  Musculoskeletal: Negative.   Skin: Negative.   Neurological: Negative.   Endo/Heme/Allergies: Negative.        Craving ice  Psychiatric/Behavioral: Negative.     PHYSICAL EXAMINATION:    BP 124/72 (BP Location: Right Arm, Patient Position: Sitting, Cuff Size: Normal)   Pulse 86   Wt 177 lb 3.2 oz (80.4 kg)   LMP 01/28/2018 (Exact Date)   BMI 32.67 kg/m     General appearance: alert, cooperative and appears stated age Breasts: normal appearance, no masses or tenderness   ASSESSMENT H/o breast lump, normal imaging, normal f/u exam Menorrhagia, perimenopausal, likely adenomyosis on imaging Severe dysmenorrhea Diabetes, recently restarted medication    PLAN Routine f/u for breast exam The patient declines the mirena IUD Discussed endometrial ablation Discussed TLH, the patient desires TLH. Reviewed risks, time out of work, recovery We discussed that her DM needs to be under control prior to surgery, she is working on that  now. Has f/u testing with her primary next month Would like to schedule surgery for the end of January, will need to return for a pre-op    An After Visit Summary was  printed and given to the patient.  ~15 minutes face to face time of which over 50% was spent in counseling.

## 2018-03-04 ENCOUNTER — Encounter: Payer: Self-pay | Admitting: Obstetrics and Gynecology

## 2018-03-04 ENCOUNTER — Other Ambulatory Visit: Payer: Self-pay

## 2018-03-04 ENCOUNTER — Ambulatory Visit: Payer: BLUE CROSS/BLUE SHIELD | Admitting: Obstetrics and Gynecology

## 2018-03-04 VITALS — BP 124/72 | HR 86 | Wt 177.2 lb

## 2018-03-04 DIAGNOSIS — Z8639 Personal history of other endocrine, nutritional and metabolic disease: Secondary | ICD-10-CM

## 2018-03-04 DIAGNOSIS — N946 Dysmenorrhea, unspecified: Secondary | ICD-10-CM

## 2018-03-04 DIAGNOSIS — N63 Unspecified lump in unspecified breast: Secondary | ICD-10-CM

## 2018-03-04 DIAGNOSIS — N920 Excessive and frequent menstruation with regular cycle: Secondary | ICD-10-CM

## 2018-03-04 NOTE — Patient Instructions (Signed)

## 2018-03-13 ENCOUNTER — Other Ambulatory Visit: Payer: Self-pay | Admitting: Physician Assistant

## 2018-03-13 DIAGNOSIS — E039 Hypothyroidism, unspecified: Secondary | ICD-10-CM

## 2018-03-13 MED ORDER — LEVOTHYROXINE SODIUM 50 MCG PO TABS: 50.0000 ug | ORAL_TABLET | Freq: Every day | ORAL | 0 refills | Status: DC

## 2018-03-13 NOTE — Telephone Encounter (Signed)
Copied from CRM [id number]. Topic: Quick Communication - Rx Refill/Question >> Mar 13, 2018 12:29 PM Burchel, Abbi R wrote: Medication: levothyroxine (SYNTHROID, LEVOTHROID) 50 MCG tablet  Preferred [facility]acy: [facility] [place], [place] - [address] [place] [address] Phone: [phone] Fax: [phone]  Please note pt is out of this medication, and has been out for several days.   Pt was advised that RX refills may take up to 3 business days. We ask that you follow-up with your pharmacy.

## 2018-03-13 NOTE — Telephone Encounter (Signed)
Requested Prescriptions  Pending Prescriptions Disp Refills  . levothyroxine (SYNTHROID, LEVOTHROID) 50 MCG tablet 90 tablet 0    Sig: Take 1 tablet (50 mcg total) by mouth daily.     Endocrinology:  Hypothyroid Agents Failed - 03/13/2018 12:35 PM      Failed - TSH needs to be rechecked within 3 months after an abnormal result. Refill until TSH is due.      Passed - TSH in normal range and within 360 days    TSH  Date Value Ref Range Status  01/02/2018 2.980 0.450 - 4.500 uIU/mL Final         Passed - Valid encounter within last 12 months    Recent Outpatient Visits          2 months ago Tinea cruris   [facility], Haileigh Pitz L, PA-C   5 months ago Open wound of right upper arm, subsequent encounter   [facility], Jonie Burdell L, PA-C   5 months ago Open wound of right upper arm, initial encounter   [facility], Brittie Whisnant L, PA-C   6 months ago Open wound of right upper arm, initial encounter   [facility], Dema Severin, PA-C   6 months ago Acquired hypothyroidism   [facility] at [facility], Myrle Sheng, MD

## 2018-04-20 DIAGNOSIS — E119 Type 2 diabetes mellitus without complications: Secondary | ICD-10-CM | POA: Diagnosis not present

## 2018-04-20 DIAGNOSIS — E1142 Type 2 diabetes mellitus with diabetic polyneuropathy: Secondary | ICD-10-CM | POA: Diagnosis not present

## 2018-04-20 DIAGNOSIS — D509 Iron deficiency anemia, unspecified: Secondary | ICD-10-CM | POA: Diagnosis not present

## 2018-04-23 ENCOUNTER — Telehealth: Payer: Self-pay | Admitting: Obstetrics and Gynecology

## 2018-04-23 DIAGNOSIS — E1142 Type 2 diabetes mellitus with diabetic polyneuropathy: Secondary | ICD-10-CM | POA: Diagnosis not present

## 2018-04-23 DIAGNOSIS — E7800 Pure hypercholesterolemia, unspecified: Secondary | ICD-10-CM | POA: Diagnosis not present

## 2018-04-23 DIAGNOSIS — E6609 Other obesity due to excess calories: Secondary | ICD-10-CM | POA: Diagnosis not present

## 2018-04-23 DIAGNOSIS — Z23 Encounter for immunization: Secondary | ICD-10-CM | POA: Diagnosis not present

## 2018-04-23 DIAGNOSIS — D509 Iron deficiency anemia, unspecified: Secondary | ICD-10-CM | POA: Diagnosis not present

## 2018-04-23 DIAGNOSIS — E039 Hypothyroidism, unspecified: Secondary | ICD-10-CM | POA: Diagnosis not present

## 2018-04-23 NOTE — Telephone Encounter (Signed)
Call placed to patient to follow up with desire for surgery at the end of January and to review 2020 benefits.  Reviewed 2020 benefit for surgery. Patient understood and agreeable. Patient is scheduled 05/07/17 with Dr Oscar LaJertson to discuss surgery options. Patient advises she will confirm at appointment.   Forwarding to Dr Oscar LaJertson for final review. Patient is agreeable to disposition. Will close encounter.  cc: Billie RuddySally Yeakley, RN

## 2018-05-04 NOTE — Progress Notes (Deleted)
GYNECOLOGY  VISIT   HPI: 57 y.o.   Divorced White or Caucasian Not Hispanic or Latino  female   G1P1 with No LMP recorded.   here for surgery consult.    GYNECOLOGIC HISTORY: No LMP recorded. Contraception:*** Menopausal hormone therapy: ***        OB History    Gravida  1   Para  1   Term      Preterm      AB      Living        SAB      TAB      Ectopic      Multiple      Live Births                 Patient Active Problem List   Diagnosis Date Noted  . Hypothyroidism   . Elevated LDL cholesterol level 06/20/2017  . Acquired hypothyroidism 06/20/2017  . Diabetic neuropathy (HCC) 06/13/2017  . Stress at home 06/13/2017  . Type 2 diabetes mellitus (HCC) 07/03/2011  . Anxiety disorder 07/03/2011  . Dyspepsia 07/03/2011  . Obesity (BMI 30.0-34.9) 07/03/2011    Past Medical History:  Diagnosis Date  . Anxiety   . Arthritis   . Depression   . Diabetes mellitus   . History of iron deficiency anemia   . Hypothyroidism   . Migraines     Past Surgical History:  Procedure Laterality Date  . CESAREAN SECTION    . TUBAL LIGATION      Current Outpatient Medications  Medication Sig Dispense Refill  . clotrimazole-betamethasone (LOTRISONE) cream Apply 1 application topically 2 (two) times daily. 30 g 0  . Dulaglutide (TRULICITY) 0.75 MG/0.5ML SOPN Inject 0.75 mg into the skin once a week. 12 pen 0  . escitalopram (LEXAPRO) 20 MG tablet Take 1 tablet (20 mg total) by mouth daily. 90 tablet 1  . gabapentin (NEURONTIN) 300 MG capsule Take 3 capsules (900 mg total) by mouth 3 (three) times daily. 270 capsule 3  . levothyroxine (SYNTHROID, LEVOTHROID) 50 MCG tablet Take 1 tablet (50 mcg total) by mouth daily. 90 tablet 0  . lisinopril (PRINIVIL,ZESTRIL) 5 MG tablet Take 1 tablet (5 mg total) by mouth daily. 90 tablet 3  . LORazepam (ATIVAN) 1 MG tablet TAKE ONE TABLET BY MOUTH TWICE A DAY FOR ANXIETY 60 tablet 0  . metFORMIN (GLUCOPHAGE) 1000 MG tablet TAKE  ONE TABLET BY MOUTH TWICE A DAY WITH A MEAL 180 tablet 3  . rosuvastatin (CRESTOR) 10 MG tablet Take 1 tablet (10 mg total) by mouth daily. 90 tablet 3   No current facility-administered medications for this visit.      ALLERGIES: Patient has no known allergies.  Family History  Problem Relation Age of Onset  . Diabetes Mother   . Diabetes Sister   . Hypertension Sister   . Thyroid disease Sister   . Diabetes Maternal Grandmother     Social History   Socioeconomic History  . Marital status: Divorced    Spouse name: Tammy SoursGreg  . Number of children: 1  . Years of education: Not on file  . Highest education level: Not on file  Occupational History  . Not on file  Social Needs  . Financial resource strain: Not on file  . Food insecurity:    Worry: Not on file    Inability: Not on file  . Transportation needs:    Medical: Not on file    Non-medical: Not on file  Tobacco Use  . Smoking status: Former Smoker    Packs/day: 1.00    Years: 25.00    Pack years: 25.00    Types: Cigarettes    Last attempt to quit: 06/07/2003    Years since quitting: 14.9  . Smokeless tobacco: Never Used  Substance and Sexual Activity  . Alcohol use: Yes    Comment: a couple glasses wine a week  . Drug use: No  . Sexual activity: Not Currently    Birth control/protection: Surgical    Comment: BTL   Lifestyle  . Physical activity:    Days per week: Not on file    Minutes per session: Not on file  . Stress: Not on file  Relationships  . Social connections:    Talks on phone: Not on file    Gets together: Not on file    Attends religious service: Not on file    Active member of club or organization: Not on file    Attends meetings of clubs or organizations: Not on file    Relationship status: Not on file  . Intimate partner violence:    Fear of current or ex partner: Not on file    Emotionally abused: Not on file    Physically abused: Not on file    Forced sexual activity: Not on file   Other Topics Concern  . 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    General appearance: alert, cooperative and appears stated age Neck: no adenopathy, supple, symmetrical, trachea midline and thyroid {CHL AMB PHY EX THYROID NORM [phone]::"normal to inspection and palpation"} Breasts: {Exam; breast:13139::"normal appearance, no masses or tenderness"} Abdomen: soft, non-tender; non distended, no masses,  no organomegaly  Pelvic: External genitalia:  no lesions              Urethra:  normal appearing urethra with no masses, tenderness or lesions              Bartholins and Skenes: normal                 Vagina: normal appearing vagina with normal color and discharge, no lesions              Cervix: {CHL AMB PHY EX CERVIX NORM DEFAULT:[phone]::"no lesions"}              Bimanual Exam:  Uterus:  {CHL AMB PHY EX UTERUS NORM [phone]::"normal size, contour, position, consistency, mobility, non-tender"}              Adnexa: {CHL AMB PHY EX ADNEXA NO MASS DEFAULT:[phone]::"no mass, fullness, tenderness"}              Rectovaginal: {yes no:[id number]}.  Confirms.              Anus:  normal sphincter tone, no lesions  Chaperone was present for exam.  ASSESSMENT     PLAN    An After Visit Summary was printed and given to the patient.  *** minutes face to face time of which over 50% was spent in counseling.

## 2018-05-05 ENCOUNTER — Telehealth: Payer: Self-pay | Admitting: Obstetrics and Gynecology

## 2018-05-05 NOTE — Telephone Encounter (Signed)
Per chart review in Care Everywhere- last A!C 10.5 on 04-20-18.  Call to Ruth Mayo. Requesting to proceed with surgery on 05-25-18 due to available help at home. Advised that week is not available but 06-01-18 is available. Discussed A!C results and PCP office visit does not mention surgical clearance.  Advised A!C has to be close to 8 for elective surgical procedure.  Ruth Mayo states she has had heavy cycles and is anemic. CBC from 04-20-18- HGB 12.4.  Ruth Mayo states she is taking iron supplement.  Ruth Mayo will contact PCP for insulin management of blood sugar to allow scheduling of surgery.  Advised if her cycles have worsened, recommend office visit with Dr Oscar LaJertson for evaluation and determine surgical plan.   Ruth Mayo will call PCP and requests review call with Dr Oscar LaJertson. Does not want to come in if cant schedule surgery.

## 2018-05-05 NOTE — Telephone Encounter (Signed)
See previous phone note.  Patient called and confirmed she is ready to proceed with scheduling surgery. Patient is requesting to schedule on 05/25/2018.  Advised patient will forward to Nurse Supervisor for scheduling.  Routing to Billie RuddySally Yeakley, RN

## 2018-05-07 ENCOUNTER — Ambulatory Visit: Payer: BLUE CROSS/BLUE SHIELD | Admitting: Obstetrics and Gynecology

## 2018-05-07 NOTE — Telephone Encounter (Signed)
Patient is calling asking if needs to keep her "discuss surgery" appointment this afternoon? She said that someone was supposed to call and let her know.

## 2018-05-07 NOTE — Telephone Encounter (Signed)
Call to patient. Advised have not received any update from PCP regarding surgical clearance. Patient states she has stomach virus and had to cancel appointment. She is still sick and feels she should reschedule today until feeling better and sees PCP.  Surgery consult rescheduled to 05-12-18. Patient aware of tentative plan for 06-01-18 pending PCP approval and management of A1C and Dr Oscar La review of PCP notes and assessment of menorrhagia.    Routing to Dr Oscar La for review. Encounter closed.

## 2018-05-08 ENCOUNTER — Telehealth: Payer: Self-pay | Admitting: Obstetrics and Gynecology

## 2018-05-08 DIAGNOSIS — F411 Generalized anxiety disorder: Secondary | ICD-10-CM | POA: Diagnosis not present

## 2018-05-08 DIAGNOSIS — E1142 Type 2 diabetes mellitus with diabetic polyneuropathy: Secondary | ICD-10-CM | POA: Diagnosis not present

## 2018-05-08 NOTE — Telephone Encounter (Signed)
Patient calling to discuss surgery with Kennon Rounds. Can be reached at her work number.

## 2018-05-08 NOTE — Telephone Encounter (Signed)
Return call to patient. States PCP appointment today for surgery and needs to know what we need for surgical clearance.  Advised office note from PCP or letter indicating cleared for surgery- no specific form is needed.   Patient is advised that PCP clearance alone is not all that is needed. Dr Oscar La and hospital anesthesia will have to agree with surgical plan for elective surgery with elevated A1C. Patient states PCP has given insulin to control blood glucose and that she is bleeding heavily enough to require 5 pads from 7-12.  Advised that office visit was recommended (+ ee previous note) for assessment of bleeding to determine surgery plan.  Patient cancelled appointment yesterday and rescheduled to 1- 7-20.

## 2018-05-12 ENCOUNTER — Other Ambulatory Visit: Payer: Self-pay

## 2018-05-12 ENCOUNTER — Ambulatory Visit (INDEPENDENT_AMBULATORY_CARE_PROVIDER_SITE_OTHER): Payer: BLUE CROSS/BLUE SHIELD | Admitting: Obstetrics and Gynecology

## 2018-05-12 ENCOUNTER — Encounter: Payer: Self-pay | Admitting: Obstetrics and Gynecology

## 2018-05-12 VITALS — BP 118/70 | HR 80 | Wt 177.0 lb

## 2018-05-12 DIAGNOSIS — N920 Excessive and frequent menstruation with regular cycle: Secondary | ICD-10-CM

## 2018-05-12 DIAGNOSIS — E1165 Type 2 diabetes mellitus with hyperglycemia: Secondary | ICD-10-CM | POA: Diagnosis not present

## 2018-05-12 DIAGNOSIS — N946 Dysmenorrhea, unspecified: Secondary | ICD-10-CM

## 2018-05-12 NOTE — Progress Notes (Signed)
GYNECOLOGY  VISIT   HPI: 58 y.o.   Divorced White or Caucasian Not Hispanic or Latino  female   G1P1 with Patient's last menstrual period was 05/04/2018 (exact date).   here to discuss surgery. The patient was seen in the fall c/o menorrhagia and severe dysmenorrhea. FSH was 44, normal pap, atrophic endometrial biopsy, ultrasound with likely adenomyosis.  She had iron deficient anemia earlier this year, but had a normal CBC and iron studies in 12/19. She desires definitive therapy and desires hysterectomy.  The patient has DM, she had been without insurance and was off medication. Her HgbA1C from 01/02/18 was 10.3 and she was started on metformin. HgbA1C from last month was 10.5 with a glucose of 322. Her primary started her on insulin last week.  Since starting the insulin her fasting glucose has gone from 320, to 244  GYNECOLOGIC HISTORY: Patient's last menstrual period was 05/04/2018 (exact date). Contraception:Tubal ligation Menopausal hormone therapy: NA        OB History    Gravida  1   Para  1   Term      Preterm      AB      Living        SAB      TAB      Ectopic      Multiple      Live Births                 Patient Active Problem List   Diagnosis Date Noted  . Hypothyroidism   . Elevated LDL cholesterol level 06/20/2017  . Acquired hypothyroidism 06/20/2017  . Diabetic neuropathy (HCC) 06/13/2017  . Stress at home 06/13/2017  . Type 2 diabetes mellitus (HCC) 07/03/2011  . Anxiety disorder 07/03/2011  . Dyspepsia 07/03/2011  . Obesity (BMI 30.0-34.9) 07/03/2011    Past Medical History:  Diagnosis Date  . Anxiety   . Arthritis   . Depression   . Diabetes mellitus   . History of iron deficiency anemia   . Hypothyroidism   . Migraines     Past Surgical History:  Procedure Laterality Date  . CESAREAN SECTION    . TUBAL LIGATION      Current Outpatient Medications  Medication Sig Dispense Refill  . clotrimazole-betamethasone (LOTRISONE)  cream Apply 1 application topically 2 (two) times daily. 30 g 0  . Dulaglutide (TRULICITY) 0.75 MG/0.5ML SOPN Inject 0.75 mg into the skin once a week. 12 pen 0  . gabapentin (NEURONTIN) 300 MG capsule Take 3 capsules (900 mg total) by mouth 3 (three) times daily. 270 capsule 3  . Insulin Glargine (LANTUS SOLOSTAR) 100 UNIT/ML Solostar Pen Inject 15 Units into the skin daily.    Marland Kitchen. levothyroxine (SYNTHROID, LEVOTHROID) 50 MCG tablet Take 1 tablet (50 mcg total) by mouth daily. 90 tablet 0  . lisinopril (PRINIVIL,ZESTRIL) 5 MG tablet Take 1 tablet (5 mg total) by mouth daily. 90 tablet 3  . LORazepam (ATIVAN) 1 MG tablet TAKE ONE TABLET BY MOUTH TWICE A DAY FOR ANXIETY 60 tablet 0  . metFORMIN (GLUCOPHAGE) 1000 MG tablet TAKE ONE TABLET BY MOUTH TWICE A DAY WITH A MEAL 180 tablet 3  . rosuvastatin (CRESTOR) 10 MG tablet Take 1 tablet (10 mg total) by mouth daily. 90 tablet 3  . sertraline (ZOLOFT) 100 MG tablet Take 100 mg by mouth daily.     No current facility-administered medications for this visit.      ALLERGIES: Patient has no known allergies.  Family History  Problem Relation Age of Onset  . Diabetes Mother   . Diabetes Sister   . Hypertension Sister   . Thyroid disease Sister   . Diabetes Maternal Grandmother     Social History   Socioeconomic History  . Marital status: Divorced    Spouse name: Tammy SoursGreg  . Number of children: 1  . Years of education: Not on file  . Highest education level: Not on file  Occupational History  . Not on file  Social Needs  . Financial resource strain: Not on file  . Food insecurity:    Worry: Not on file    Inability: Not on file  . Transportation needs:    Medical: Not on file    Non-medical: Not on file  Tobacco Use  . Smoking status: Former Smoker    Packs/day: 1.00    Years: 25.00    Pack years: 25.00    Types: Cigarettes    Last attempt to quit: 06/07/2003    Years since quitting: 14.9  . Smokeless tobacco: Never Used  Substance  and Sexual Activity  . Alcohol use: Yes    Comment: a couple glasses wine a week  . Drug use: No  . Sexual activity: Not Currently    Birth control/protection: Surgical    Comment: BTL   Lifestyle  . Physical activity:    Days per week: Not on file    Minutes per session: Not on file  . Stress: Not on file  Relationships  . Social connections:    Talks on phone: Not on file    Gets together: Not on file    Attends religious service: Not on file    Active member of club or organization: Not on file    Attends meetings of clubs or organizations: Not on file    Relationship status: Not on file  . Intimate partner violence:    Fear of current or ex partner: Not on file    Emotionally abused: Not on file    Physically abused: Not on file    Forced sexual activity: Not on file  Other Topics Concern  . 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   HENT: Negative.   Eyes: Negative.   Respiratory: Negative.   Cardiovascular: Negative.   Gastrointestinal: Positive for nausea.       Bloating  Genitourinary:       Painful cycles Excessive bleeding  Musculoskeletal: Negative.   Skin: Negative.   Neurological: Negative.   Endo/Heme/Allergies: Negative.   Psychiatric/Behavioral: Negative.     PHYSICAL EXAMINATION:    BP 118/70 (BP Location: Right Arm, Patient Position: Sitting, Cuff Size: Normal)   Pulse 80   Wt 177 lb (80.3 kg)   LMP 05/04/2018 (Exact Date)   BMI 32.64 kg/m     General appearance: alert, cooperative and appears stated age  ASSESSMENT Perimenopausal with menorrhagia, severe dysmenorrhea and suspected adenomyosis Uncontrolled DM, just changed from oral agents to insulin H/O iron def anemia, normal hgb and iron studies last month    PLAN The patient declines the mirena IUD and the mini-pill We discussed surgery Discussed the need for  good control of her DM prior to surgery. Her HgbA1C should be at 8 or better. Discussed the risk of MI and infections with surgery in uncontrolled diabetics. I will reach out  to her primary to discuss further   An After Visit Summary was printed and given to the patient.  ~15 minutes face to face time of which over 50% was spent in counseling.   Called primary, unable to get through

## 2018-05-19 NOTE — Telephone Encounter (Signed)
See office visit note from 05-12-18.  Routing to Dr Oscar La. Encounter closed.

## 2018-05-27 DIAGNOSIS — N809 Endometriosis, unspecified: Secondary | ICD-10-CM | POA: Diagnosis not present

## 2018-05-27 DIAGNOSIS — N946 Dysmenorrhea, unspecified: Secondary | ICD-10-CM | POA: Diagnosis not present

## 2018-05-27 DIAGNOSIS — N920 Excessive and frequent menstruation with regular cycle: Secondary | ICD-10-CM | POA: Diagnosis not present

## 2018-06-04 ENCOUNTER — Other Ambulatory Visit: Payer: Self-pay | Admitting: Family Medicine

## 2018-06-04 DIAGNOSIS — E039 Hypothyroidism, unspecified: Secondary | ICD-10-CM

## 2018-06-04 NOTE — Telephone Encounter (Addendum)
Attempted to call patient, no VM set up. As I was reading the chart, I see that she's established with Benny Lennert at [facility].

## 2018-06-18 DIAGNOSIS — E039 Hypothyroidism, unspecified: Secondary | ICD-10-CM | POA: Diagnosis not present

## 2018-06-18 DIAGNOSIS — M25512 Pain in left shoulder: Secondary | ICD-10-CM | POA: Diagnosis not present

## 2018-06-18 DIAGNOSIS — M25511 Pain in right shoulder: Secondary | ICD-10-CM | POA: Diagnosis not present

## 2018-06-18 DIAGNOSIS — G8929 Other chronic pain: Secondary | ICD-10-CM | POA: Diagnosis not present

## 2018-07-03 DIAGNOSIS — M25511 Pain in right shoulder: Secondary | ICD-10-CM | POA: Diagnosis not present

## 2018-07-15 ENCOUNTER — Telehealth: Payer: Self-pay

## 2018-07-15 NOTE — Telephone Encounter (Signed)
Pt received a [facility] reapply form. She will be receiving another application to complete and brink in the office

## 2018-09-25 DIAGNOSIS — E039 Hypothyroidism, unspecified: Secondary | ICD-10-CM | POA: Diagnosis not present

## 2018-09-25 DIAGNOSIS — E7800 Pure hypercholesterolemia, unspecified: Secondary | ICD-10-CM | POA: Diagnosis not present

## 2018-09-25 DIAGNOSIS — M25511 Pain in right shoulder: Secondary | ICD-10-CM | POA: Diagnosis not present

## 2018-09-25 DIAGNOSIS — E1142 Type 2 diabetes mellitus with diabetic polyneuropathy: Secondary | ICD-10-CM | POA: Diagnosis not present

## 2018-10-09 DIAGNOSIS — I959 Hypotension, unspecified: Secondary | ICD-10-CM | POA: Diagnosis not present

## 2018-10-09 DIAGNOSIS — R42 Dizziness and giddiness: Secondary | ICD-10-CM | POA: Diagnosis not present

## 2018-10-09 DIAGNOSIS — E1142 Type 2 diabetes mellitus with diabetic polyneuropathy: Secondary | ICD-10-CM | POA: Diagnosis not present

## 2018-12-18 DIAGNOSIS — R635 Abnormal weight gain: Secondary | ICD-10-CM | POA: Diagnosis not present

## 2018-12-18 DIAGNOSIS — E1142 Type 2 diabetes mellitus with diabetic polyneuropathy: Secondary | ICD-10-CM | POA: Diagnosis not present

## 2018-12-25 DIAGNOSIS — M25511 Pain in right shoulder: Secondary | ICD-10-CM | POA: Diagnosis not present

## 2018-12-25 DIAGNOSIS — E039 Hypothyroidism, unspecified: Secondary | ICD-10-CM | POA: Diagnosis not present

## 2018-12-25 DIAGNOSIS — R12 Heartburn: Secondary | ICD-10-CM | POA: Diagnosis not present

## 2018-12-25 DIAGNOSIS — E1142 Type 2 diabetes mellitus with diabetic polyneuropathy: Secondary | ICD-10-CM | POA: Diagnosis not present

## 2019-01-11 DIAGNOSIS — H5210 Myopia, unspecified eye: Secondary | ICD-10-CM | POA: Diagnosis not present

## 2019-01-29 DIAGNOSIS — L989 Disorder of the skin and subcutaneous tissue, unspecified: Secondary | ICD-10-CM | POA: Diagnosis not present

## 2019-01-29 DIAGNOSIS — M79646 Pain in unspecified finger(s): Secondary | ICD-10-CM | POA: Diagnosis not present

## 2019-01-29 DIAGNOSIS — F411 Generalized anxiety disorder: Secondary | ICD-10-CM | POA: Diagnosis not present

## 2019-01-29 DIAGNOSIS — E1142 Type 2 diabetes mellitus with diabetic polyneuropathy: Secondary | ICD-10-CM | POA: Diagnosis not present

## 2019-02-19 DIAGNOSIS — Z23 Encounter for immunization: Secondary | ICD-10-CM | POA: Diagnosis not present

## 2019-03-12 DIAGNOSIS — E1142 Type 2 diabetes mellitus with diabetic polyneuropathy: Secondary | ICD-10-CM | POA: Diagnosis not present

## 2019-03-12 DIAGNOSIS — F411 Generalized anxiety disorder: Secondary | ICD-10-CM | POA: Diagnosis not present

## 2019-04-09 DIAGNOSIS — E1142 Type 2 diabetes mellitus with diabetic polyneuropathy: Secondary | ICD-10-CM | POA: Diagnosis not present

## 2019-04-09 DIAGNOSIS — E1169 Type 2 diabetes mellitus with other specified complication: Secondary | ICD-10-CM | POA: Diagnosis not present

## 2019-04-09 DIAGNOSIS — E785 Hyperlipidemia, unspecified: Secondary | ICD-10-CM | POA: Diagnosis not present

## 2019-04-09 DIAGNOSIS — F411 Generalized anxiety disorder: Secondary | ICD-10-CM | POA: Diagnosis not present

## 2019-12-07 IMAGING — MG DIGITAL DIAGNOSTIC BILATERAL MAMMOGRAM WITH TOMO AND CAD
2 series · 2 of 6 positions shown · non-contrast
Comparison: Previous exam(s).

CLINICAL DATA: 57-year-old female with a physician palpated lump
along the 2 o'clock axis of the right breast.

EXAM:
DIGITAL DIAGNOSTIC BILATERAL MAMMOGRAM WITH CAD AND TOMO
ULTRASOUND RIGHT BREAST

[R CC synth-2D]
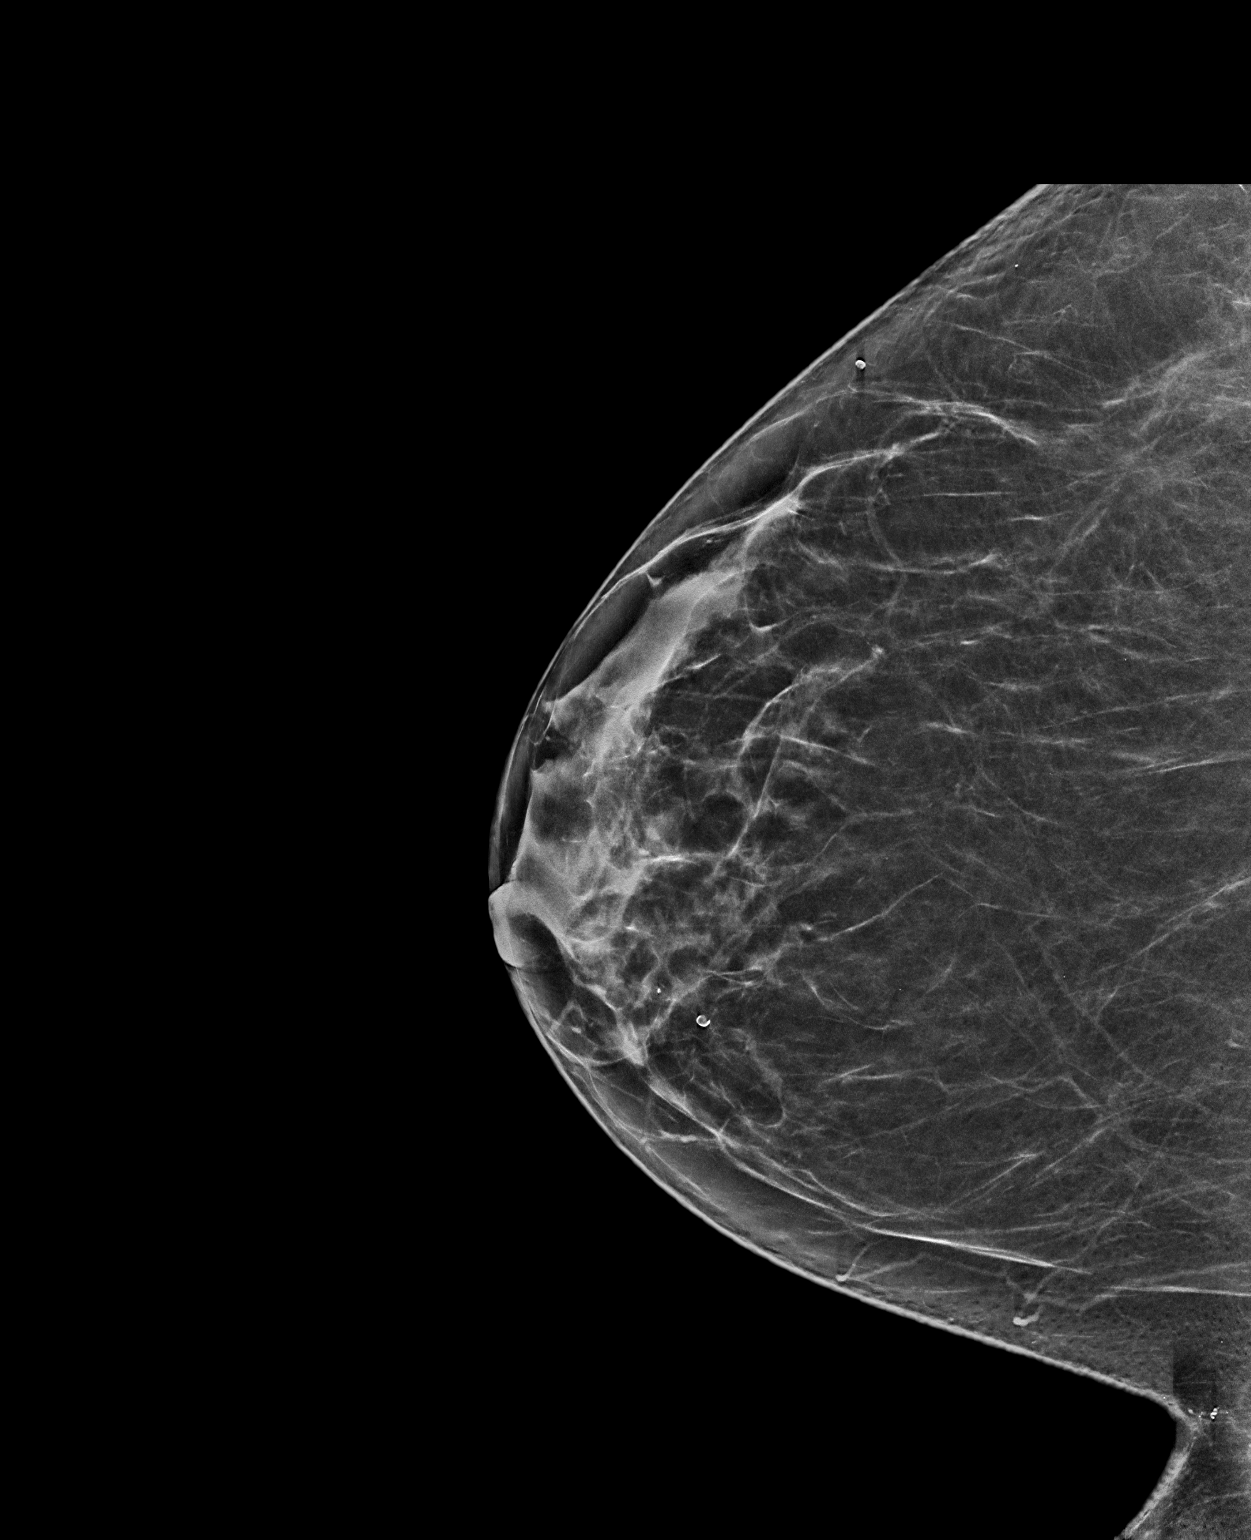

[L MLO tomo · tomo slice 37/73.0]
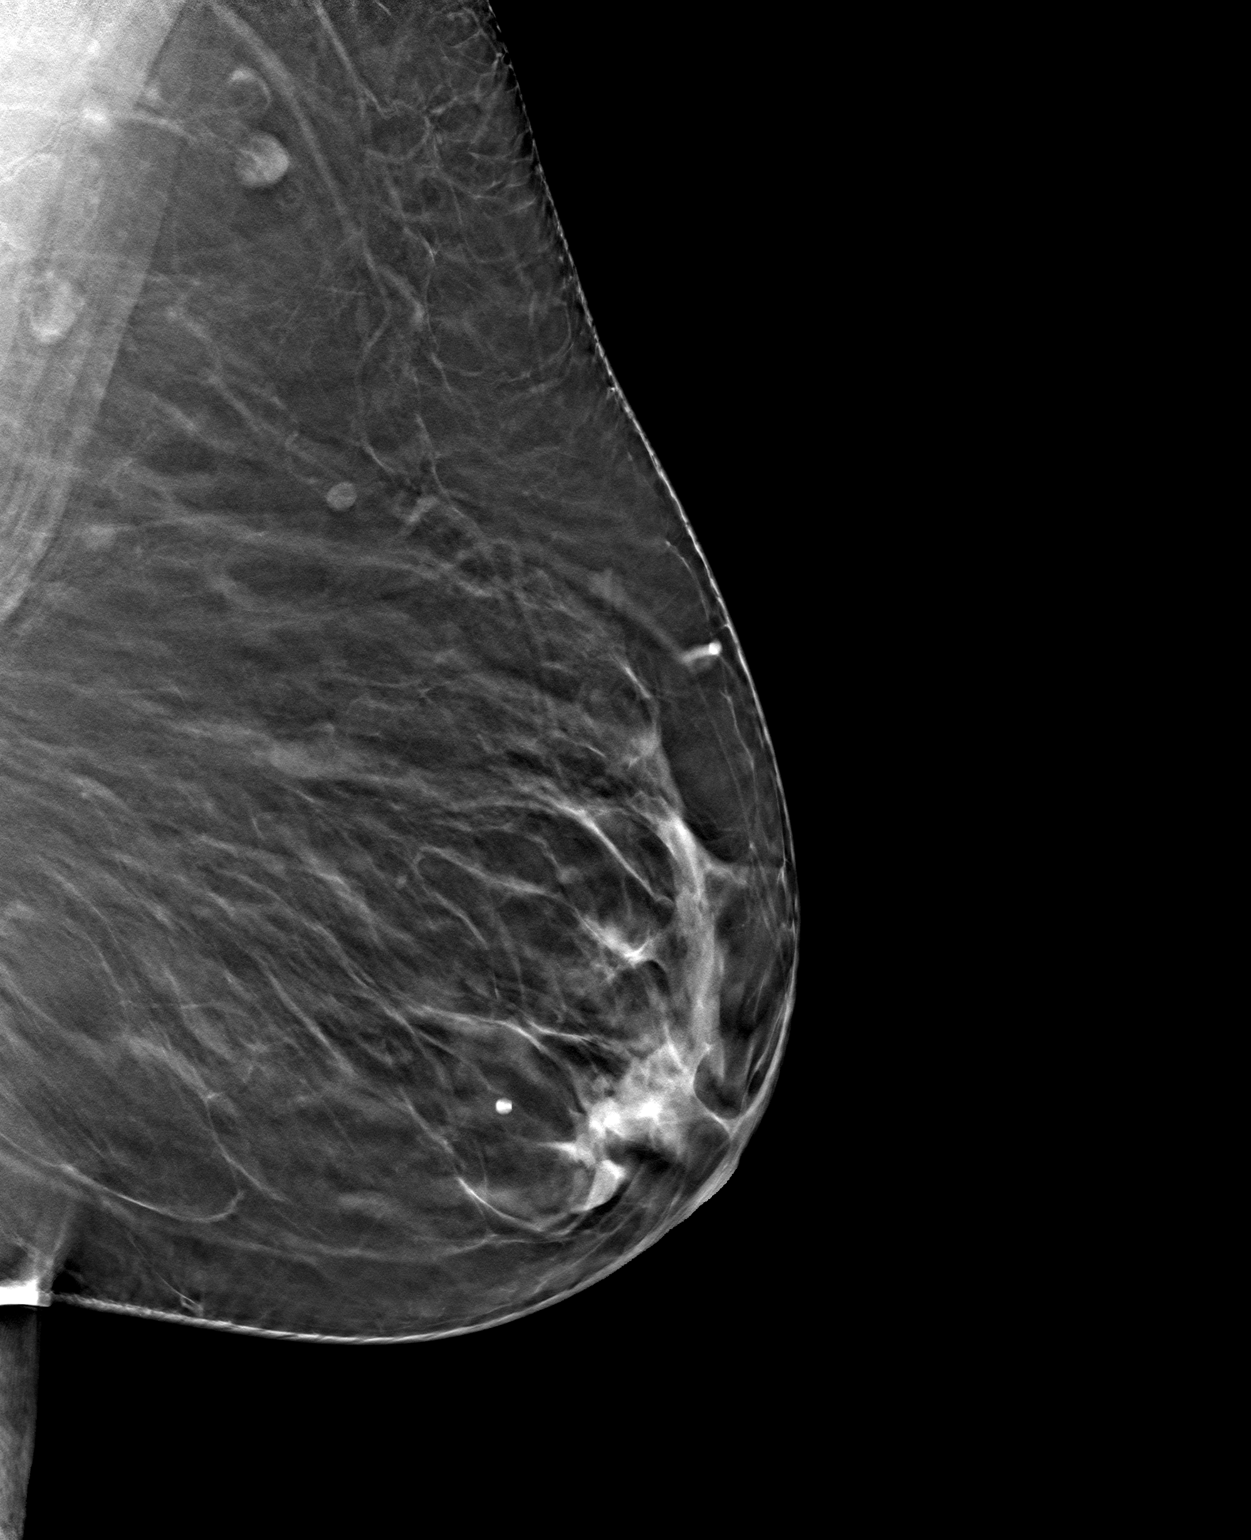

[2 of 6 positions shown; findings below may reference images not displayed]

ACR Breast Density Category b: There are scattered areas of
fibroglandular density.
FINDINGS: No suspicious mammographic findings are identified in either breast.
The parenchymal pattern is stable.

Mammographic images were processed with CAD.

On physical exam, I palpate no suspicious lumps in the upper inner
quadrant of the right breast.

Targeted ultrasound is performed, showing normal fibroglandular
tissue without focal or suspicious sonographic finding. Incidental
note is made of a few scattered simple and minimally complicated
cysts.
IMPRESSION: 1. No mammographic evidence of malignancy in either breast.
2. No additional suspicious sonographic findings corresponding with
the physician palpated right breast lump.

RECOMMENDATION:
1. Clinical follow-up recommended for the palpable area of concern
in the right breast. Any further workup should be based on clinical
grounds.
2.  Screening mammogram in one year.(Code:LH-[id number])

I have discussed the findings and recommendations with the patient.
Results were also provided in writing at the conclusion of the
visit. If applicable, a reminder letter will be sent to the patient
regarding the next appointment.

BI-RADS CATEGORY  2: Benign.

## 2023-01-13 HISTORY — PX: CORONARY ARTERY BYPASS GRAFT: SHX141

## 2023-03-02 ENCOUNTER — Other Ambulatory Visit (HOSPITAL_COMMUNITY): Payer: Self-pay

## 2023-03-02 ENCOUNTER — Inpatient Hospital Stay
Admission: RE | Admit: 2023-03-02 | Discharge: 2023-04-14 | Disposition: A | Discharge status: Rehabilitation Facility | Payer: Self-pay | Attending: Internal Medicine | Admitting: Internal Medicine

## 2023-03-02 ENCOUNTER — Inpatient Hospital Stay: Admission: RE | Admit: 2023-03-02 | Payer: BLUE CROSS/BLUE SHIELD | Source: Home / Self Care

## 2023-03-02 LAB — COMPREHENSIVE METABOLIC PANEL
ALT: 6 U/L (ref 0–44)
AST: 24 U/L (ref 15–41)
Albumin: 2 g/dL — ABNORMAL LOW (ref 3.5–5.0)
Alkaline Phosphatase: 100 U/L (ref 38–126)
Anion gap: 14 (ref 5–15)
BUN: 25 mg/dL — ABNORMAL HIGH (ref 6–20)
CO2: 24 mmol/L (ref 22–32)
Calcium: 9.5 mg/dL (ref 8.9–10.3)
Chloride: 95 mmol/L — ABNORMAL LOW (ref 98–111)
Creatinine, Ser: 1.4 mg/dL — ABNORMAL HIGH (ref 0.44–1.00)
GFR, Estimated: 43 mL/min — ABNORMAL LOW (ref >60)
Glucose, Bld: 229 mg/dL — ABNORMAL HIGH (ref 70–99)
Potassium: 3.5 mmol/L (ref 3.5–5.1)
Sodium: 133 mmol/L — ABNORMAL LOW (ref 135–145)
Total Bilirubin: 0.7 mg/dL (ref 0.3–1.2)
Total Protein: 5.3 g/dL — ABNORMAL LOW (ref 6.5–8.1)

## 2023-03-02 LAB — BLOOD GAS, ARTERIAL
Acid-Base Excess: 4.1 mmol/L — ABNORMAL HIGH (ref 0.0–2.0)
Bicarbonate: 27.4 mmol/L (ref 20.0–28.0)
O2 Saturation: 98.3 %
Patient temperature: 37
pCO2 arterial: 36 mm[Hg] (ref 32–48)
pH, Arterial: 7.49 — ABNORMAL HIGH (ref 7.35–7.45)
pO2, Arterial: 87 mm[Hg] (ref 83–108)

## 2023-03-02 LAB — CBC
HCT: 24 % — ABNORMAL LOW (ref 36.0–46.0)
Hemoglobin: 7.6 g/dL — ABNORMAL LOW (ref 12.0–15.0)
MCH: 29.8 pg (ref 26.0–34.0)
MCHC: 31.7 g/dL (ref 30.0–36.0)
MCV: 94.1 fL (ref 80.0–100.0)
Platelets: 576 10*3/uL — ABNORMAL HIGH (ref 150–400)
RBC: 2.55 MIL/uL — ABNORMAL LOW (ref 3.87–5.11)
RDW: 16.8 % — ABNORMAL HIGH (ref 11.5–15.5)
WBC: 13.2 10*3/uL — ABNORMAL HIGH (ref 4.0–10.5)
nRBC: 0 % (ref 0.0–0.2)

## 2023-03-02 LAB — HEPARIN LEVEL (UNFRACTIONATED): Heparin Unfractionated: <0.1 [IU]/mL — ABNORMAL LOW (ref 0.30–0.70)

## 2023-03-02 MED ORDER — DIATRIZOATE MEGLUMINE & SODIUM 66-10 % PO SOLN
30.0000 mL | Freq: Once | ORAL | Status: AC
  Administered 2023-03-02: 30 mL

## 2023-03-03 DIAGNOSIS — J9621 Acute and chronic respiratory failure with hypoxia: Secondary | ICD-10-CM | POA: Diagnosis not present

## 2023-03-03 DIAGNOSIS — R6520 Severe sepsis without septic shock: Secondary | ICD-10-CM

## 2023-03-03 DIAGNOSIS — Z930 Tracheostomy status: Secondary | ICD-10-CM | POA: Diagnosis not present

## 2023-03-03 DIAGNOSIS — G7281 Critical illness myopathy: Secondary | ICD-10-CM

## 2023-03-03 DIAGNOSIS — G9341 Metabolic encephalopathy: Secondary | ICD-10-CM

## 2023-03-03 LAB — HEPARIN LEVEL (UNFRACTIONATED)
Heparin Unfractionated: 0.62 [IU]/mL (ref 0.30–0.70)
Heparin Unfractionated: 0.44 [IU]/mL (ref 0.30–0.70)
Heparin Unfractionated: 0.98 [IU]/mL — ABNORMAL HIGH (ref 0.30–0.70)

## 2023-03-03 LAB — C DIFFICILE QUICK SCREEN W PCR REFLEX
C Diff antigen: NEGATIVE
C Diff interpretation: NOT DETECTED
C Diff toxin: NEGATIVE

## 2023-03-03 LAB — HEMOGLOBIN A1C
Hgb A1c MFr Bld: 6.3 % — ABNORMAL HIGH (ref 4.8–5.6)
Mean Plasma Glucose: 134.11 mg/dL

## 2023-03-03 LAB — TSH: TSH: 32.99 u[IU]/mL — ABNORMAL HIGH (ref 0.350–4.500)

## 2023-03-04 ENCOUNTER — Other Ambulatory Visit (HOSPITAL_COMMUNITY): Payer: Self-pay

## 2023-03-04 DIAGNOSIS — G9341 Metabolic encephalopathy: Secondary | ICD-10-CM

## 2023-03-04 DIAGNOSIS — Z930 Tracheostomy status: Secondary | ICD-10-CM | POA: Diagnosis not present

## 2023-03-04 DIAGNOSIS — R6520 Severe sepsis without septic shock: Secondary | ICD-10-CM

## 2023-03-04 DIAGNOSIS — J9621 Acute and chronic respiratory failure with hypoxia: Secondary | ICD-10-CM

## 2023-03-04 DIAGNOSIS — G7281 Critical illness myopathy: Secondary | ICD-10-CM

## 2023-03-04 LAB — CBC
HCT: 27.2 % — ABNORMAL LOW (ref 36.0–46.0)
Hemoglobin: 8.6 g/dL — ABNORMAL LOW (ref 12.0–15.0)
MCH: 29.7 pg (ref 26.0–34.0)
MCHC: 31.6 g/dL (ref 30.0–36.0)
MCV: 93.8 fL (ref 80.0–100.0)
Platelets: 618 10*3/uL — ABNORMAL HIGH (ref 150–400)
RBC: 2.9 MIL/uL — ABNORMAL LOW (ref 3.87–5.11)
RDW: 16.7 % — ABNORMAL HIGH (ref 11.5–15.5)
WBC: 17.3 10*3/uL — ABNORMAL HIGH (ref 4.0–10.5)
nRBC: 0.1 % (ref 0.0–0.2)

## 2023-03-04 LAB — HEPATITIS B SURFACE ANTIGEN: Hepatitis B Surface Ag: NONREACTIVE

## 2023-03-04 LAB — HEPATITIS B SURFACE ANTIBODY,QUALITATIVE: Hep B S Ab: NONREACTIVE

## 2023-03-04 LAB — HEPARIN LEVEL (UNFRACTIONATED)
Heparin Unfractionated: 0.56 [IU]/mL (ref 0.30–0.70)
Heparin Unfractionated: 0.44 [IU]/mL (ref 0.30–0.70)

## 2023-03-05 DIAGNOSIS — Z930 Tracheostomy status: Secondary | ICD-10-CM | POA: Diagnosis not present

## 2023-03-05 DIAGNOSIS — J9621 Acute and chronic respiratory failure with hypoxia: Secondary | ICD-10-CM | POA: Diagnosis not present

## 2023-03-05 DIAGNOSIS — R6520 Severe sepsis without septic shock: Secondary | ICD-10-CM

## 2023-03-05 DIAGNOSIS — G9341 Metabolic encephalopathy: Secondary | ICD-10-CM

## 2023-03-05 DIAGNOSIS — G7281 Critical illness myopathy: Secondary | ICD-10-CM

## 2023-03-05 LAB — PTH, INTACT AND CALCIUM
Calcium, Total (PTH): 9.7 mg/dL (ref 8.7–10.3)
PTH: 61 pg/mL (ref 15–65)

## 2023-03-05 LAB — HEPARIN LEVEL (UNFRACTIONATED): Heparin Unfractionated: 0.65 [IU]/mL (ref 0.30–0.70)

## 2023-03-06 DIAGNOSIS — G9341 Metabolic encephalopathy: Secondary | ICD-10-CM

## 2023-03-06 DIAGNOSIS — Z930 Tracheostomy status: Secondary | ICD-10-CM | POA: Diagnosis not present

## 2023-03-06 DIAGNOSIS — J9621 Acute and chronic respiratory failure with hypoxia: Secondary | ICD-10-CM

## 2023-03-06 DIAGNOSIS — G7281 Critical illness myopathy: Secondary | ICD-10-CM | POA: Diagnosis not present

## 2023-03-06 DIAGNOSIS — R6520 Severe sepsis without septic shock: Secondary | ICD-10-CM

## 2023-03-07 DIAGNOSIS — G7281 Critical illness myopathy: Secondary | ICD-10-CM

## 2023-03-07 DIAGNOSIS — G9341 Metabolic encephalopathy: Secondary | ICD-10-CM

## 2023-03-07 DIAGNOSIS — Z930 Tracheostomy status: Secondary | ICD-10-CM | POA: Diagnosis not present

## 2023-03-07 DIAGNOSIS — J9621 Acute and chronic respiratory failure with hypoxia: Secondary | ICD-10-CM | POA: Diagnosis not present

## 2023-03-07 DIAGNOSIS — R6520 Severe sepsis without septic shock: Secondary | ICD-10-CM

## 2023-03-07 LAB — BASIC METABOLIC PANEL
Anion gap: 12 (ref 5–15)
BUN: 17 mg/dL (ref 6–20)
CO2: 25 mmol/L (ref 22–32)
Calcium: 9.9 mg/dL (ref 8.9–10.3)
Chloride: 101 mmol/L (ref 98–111)
Creatinine, Ser: 0.99 mg/dL (ref 0.44–1.00)
GFR, Estimated: >60 mL/min (ref >60)
Glucose, Bld: 207 mg/dL — ABNORMAL HIGH (ref 70–99)
Potassium: 3.2 mmol/L — ABNORMAL LOW (ref 3.5–5.1)
Sodium: 138 mmol/L (ref 135–145)

## 2023-03-07 LAB — CBC
HCT: 26.7 % — ABNORMAL LOW (ref 36.0–46.0)
Hemoglobin: 8.2 g/dL — ABNORMAL LOW (ref 12.0–15.0)
MCH: 29.7 pg (ref 26.0–34.0)
MCHC: 30.7 g/dL (ref 30.0–36.0)
MCV: 96.7 fL (ref 80.0–100.0)
Platelets: 445 10*3/uL — ABNORMAL HIGH (ref 150–400)
RBC: 2.76 MIL/uL — ABNORMAL LOW (ref 3.87–5.11)
RDW: 17.2 % — ABNORMAL HIGH (ref 11.5–15.5)
WBC: 13.7 10*3/uL — ABNORMAL HIGH (ref 4.0–10.5)
nRBC: 0.3 % — ABNORMAL HIGH (ref 0.0–0.2)

## 2023-03-08 DIAGNOSIS — Z930 Tracheostomy status: Secondary | ICD-10-CM | POA: Diagnosis not present

## 2023-03-08 DIAGNOSIS — G9341 Metabolic encephalopathy: Secondary | ICD-10-CM

## 2023-03-08 DIAGNOSIS — J9621 Acute and chronic respiratory failure with hypoxia: Secondary | ICD-10-CM

## 2023-03-08 DIAGNOSIS — G7281 Critical illness myopathy: Secondary | ICD-10-CM | POA: Diagnosis not present

## 2023-03-08 DIAGNOSIS — R6520 Severe sepsis without septic shock: Secondary | ICD-10-CM

## 2023-03-08 LAB — HEPARIN LEVEL (UNFRACTIONATED): Heparin Unfractionated: >1.1 [IU]/mL — ABNORMAL HIGH (ref 0.30–0.70)

## 2023-03-09 DIAGNOSIS — R6520 Severe sepsis without septic shock: Secondary | ICD-10-CM | POA: Diagnosis not present

## 2023-03-09 DIAGNOSIS — J9621 Acute and chronic respiratory failure with hypoxia: Secondary | ICD-10-CM

## 2023-03-09 DIAGNOSIS — G7281 Critical illness myopathy: Secondary | ICD-10-CM

## 2023-03-09 DIAGNOSIS — Z930 Tracheostomy status: Secondary | ICD-10-CM | POA: Diagnosis not present

## 2023-03-09 DIAGNOSIS — G9341 Metabolic encephalopathy: Secondary | ICD-10-CM

## 2023-03-09 LAB — CULTURE, BLOOD (ROUTINE X 2)
Culture: NO GROWTH
Culture: NO GROWTH
Special Requests: ADEQUATE
Special Requests: ADEQUATE

## 2023-03-09 LAB — URINE CULTURE: Culture: 50000 — AB

## 2023-03-09 LAB — POTASSIUM
Potassium: 3 mmol/L — ABNORMAL LOW (ref 3.5–5.1)
Potassium: 3.1 mmol/L — ABNORMAL LOW (ref 3.5–5.1)

## 2023-03-10 DIAGNOSIS — G9341 Metabolic encephalopathy: Secondary | ICD-10-CM

## 2023-03-10 DIAGNOSIS — G7281 Critical illness myopathy: Secondary | ICD-10-CM | POA: Diagnosis not present

## 2023-03-10 DIAGNOSIS — Z930 Tracheostomy status: Secondary | ICD-10-CM

## 2023-03-10 DIAGNOSIS — J9621 Acute and chronic respiratory failure with hypoxia: Secondary | ICD-10-CM

## 2023-03-10 DIAGNOSIS — R6520 Severe sepsis without septic shock: Secondary | ICD-10-CM | POA: Diagnosis not present

## 2023-03-10 LAB — POTASSIUM: Potassium: 3.7 mmol/L (ref 3.5–5.1)

## 2023-03-11 DIAGNOSIS — Z930 Tracheostomy status: Secondary | ICD-10-CM

## 2023-03-11 DIAGNOSIS — G7281 Critical illness myopathy: Secondary | ICD-10-CM | POA: Diagnosis not present

## 2023-03-11 DIAGNOSIS — R6520 Severe sepsis without septic shock: Secondary | ICD-10-CM

## 2023-03-11 DIAGNOSIS — J9621 Acute and chronic respiratory failure with hypoxia: Secondary | ICD-10-CM

## 2023-03-11 DIAGNOSIS — G9341 Metabolic encephalopathy: Secondary | ICD-10-CM

## 2023-03-11 LAB — IRON AND TIBC
Iron: 16 ug/dL — ABNORMAL LOW (ref 28–170)
Saturation Ratios: 9 % — ABNORMAL LOW (ref 10.4–31.8)
TIBC: 188 ug/dL — ABNORMAL LOW (ref 250–450)
UIBC: 172 ug/dL

## 2023-03-11 LAB — RENAL FUNCTION PANEL
Albumin: 1.9 g/dL — ABNORMAL LOW (ref 3.5–5.0)
Anion gap: 11 (ref 5–15)
BUN: 55 mg/dL — ABNORMAL HIGH (ref 6–20)
CO2: 26 mmol/L (ref 22–32)
Calcium: 10.7 mg/dL — ABNORMAL HIGH (ref 8.9–10.3)
Chloride: 98 mmol/L (ref 98–111)
Creatinine, Ser: 1.26 mg/dL — ABNORMAL HIGH (ref 0.44–1.00)
GFR, Estimated: 49 mL/min — ABNORMAL LOW (ref >60)
Glucose, Bld: 252 mg/dL — ABNORMAL HIGH (ref 70–99)
Phosphorus: 2.3 mg/dL — ABNORMAL LOW (ref 2.5–4.6)
Potassium: 3.4 mmol/L — ABNORMAL LOW (ref 3.5–5.1)
Sodium: 135 mmol/L (ref 135–145)

## 2023-03-11 LAB — CBC WITH DIFFERENTIAL/PLATELET
Abs Immature Granulocytes: 0.42 10*3/uL — ABNORMAL HIGH (ref 0.00–0.07)
Basophils Absolute: 0.1 10*3/uL (ref 0.0–0.1)
Basophils Relative: 1 %
Eosinophils Absolute: 0.1 10*3/uL (ref 0.0–0.5)
Eosinophils Relative: 1 %
HCT: 22.9 % — ABNORMAL LOW (ref 36.0–46.0)
Hemoglobin: 7 g/dL — ABNORMAL LOW (ref 12.0–15.0)
Immature Granulocytes: 4 %
Lymphocytes Relative: 11 %
Lymphs Abs: 1.2 10*3/uL (ref 0.7–4.0)
MCH: 30.3 pg (ref 26.0–34.0)
MCHC: 30.6 g/dL (ref 30.0–36.0)
MCV: 99.1 fL (ref 80.0–100.0)
Monocytes Absolute: 0.8 10*3/uL (ref 0.1–1.0)
Monocytes Relative: 7 %
Neutro Abs: 8.4 10*3/uL — ABNORMAL HIGH (ref 1.7–7.7)
Neutrophils Relative %: 76 %
Platelets: 430 10*3/uL — ABNORMAL HIGH (ref 150–400)
RBC: 2.31 MIL/uL — ABNORMAL LOW (ref 3.87–5.11)
RDW: 18 % — ABNORMAL HIGH (ref 11.5–15.5)
WBC: 10.9 10*3/uL — ABNORMAL HIGH (ref 4.0–10.5)
nRBC: 0 % (ref 0.0–0.2)

## 2023-03-11 LAB — OCCULT BLOOD X 1 CARD TO LAB, STOOL: Fecal Occult Bld: NEGATIVE

## 2023-03-11 LAB — HEMOGLOBIN AND HEMATOCRIT, BLOOD
HCT: 24.6 % — ABNORMAL LOW (ref 36.0–46.0)
Hemoglobin: 7.5 g/dL — ABNORMAL LOW (ref 12.0–15.0)

## 2023-03-11 LAB — TYPE AND SCREEN
ABO/RH(D): O POS
Antibody Screen: NEGATIVE

## 2023-03-11 LAB — MAGNESIUM: Magnesium: 1.9 mg/dL (ref 1.7–2.4)

## 2023-03-11 LAB — ABO/RH: ABO/RH(D): O POS

## 2023-03-12 DIAGNOSIS — R6520 Severe sepsis without septic shock: Secondary | ICD-10-CM

## 2023-03-12 DIAGNOSIS — G9341 Metabolic encephalopathy: Secondary | ICD-10-CM

## 2023-03-12 DIAGNOSIS — Z930 Tracheostomy status: Secondary | ICD-10-CM

## 2023-03-12 DIAGNOSIS — J9621 Acute and chronic respiratory failure with hypoxia: Secondary | ICD-10-CM

## 2023-03-12 DIAGNOSIS — G7281 Critical illness myopathy: Secondary | ICD-10-CM | POA: Diagnosis not present

## 2023-03-13 DIAGNOSIS — G7281 Critical illness myopathy: Secondary | ICD-10-CM

## 2023-03-13 DIAGNOSIS — R6520 Severe sepsis without septic shock: Secondary | ICD-10-CM | POA: Diagnosis not present

## 2023-03-13 DIAGNOSIS — G9341 Metabolic encephalopathy: Secondary | ICD-10-CM

## 2023-03-13 DIAGNOSIS — Z930 Tracheostomy status: Secondary | ICD-10-CM | POA: Diagnosis not present

## 2023-03-13 DIAGNOSIS — J9621 Acute and chronic respiratory failure with hypoxia: Secondary | ICD-10-CM

## 2023-03-13 LAB — BLOOD GAS, ARTERIAL
Acid-Base Excess: 7.6 mmol/L — ABNORMAL HIGH (ref 0.0–2.0)
Bicarbonate: 33.7 mmol/L — ABNORMAL HIGH (ref 20.0–28.0)
O2 Saturation: 99.8 %
Patient temperature: 36.2
pCO2 arterial: 50 mm[Hg] — ABNORMAL HIGH (ref 32–48)
pH, Arterial: 7.43 (ref 7.35–7.45)
pO2, Arterial: 77 mm[Hg] — ABNORMAL LOW (ref 83–108)

## 2023-03-14 DIAGNOSIS — R6520 Severe sepsis without septic shock: Secondary | ICD-10-CM | POA: Diagnosis not present

## 2023-03-14 DIAGNOSIS — G7281 Critical illness myopathy: Secondary | ICD-10-CM

## 2023-03-14 DIAGNOSIS — G9341 Metabolic encephalopathy: Secondary | ICD-10-CM

## 2023-03-14 DIAGNOSIS — J9621 Acute and chronic respiratory failure with hypoxia: Secondary | ICD-10-CM

## 2023-03-14 DIAGNOSIS — Z930 Tracheostomy status: Secondary | ICD-10-CM

## 2023-03-15 DIAGNOSIS — G9341 Metabolic encephalopathy: Secondary | ICD-10-CM

## 2023-03-15 DIAGNOSIS — G7281 Critical illness myopathy: Secondary | ICD-10-CM | POA: Diagnosis not present

## 2023-03-15 DIAGNOSIS — J9621 Acute and chronic respiratory failure with hypoxia: Secondary | ICD-10-CM | POA: Diagnosis not present

## 2023-03-15 DIAGNOSIS — Z930 Tracheostomy status: Secondary | ICD-10-CM

## 2023-03-15 DIAGNOSIS — R6520 Severe sepsis without septic shock: Secondary | ICD-10-CM | POA: Diagnosis not present

## 2023-03-15 LAB — BASIC METABOLIC PANEL
Anion gap: 15 (ref 5–15)
BUN: 75 mg/dL — ABNORMAL HIGH (ref 6–20)
CO2: 29 mmol/L (ref 22–32)
Calcium: 10.1 mg/dL (ref 8.9–10.3)
Chloride: 99 mmol/L (ref 98–111)
Creatinine, Ser: 1.26 mg/dL — ABNORMAL HIGH (ref 0.44–1.00)
GFR, Estimated: 49 mL/min — ABNORMAL LOW (ref >60)
Glucose, Bld: 403 mg/dL — ABNORMAL HIGH (ref 70–99)
Potassium: 4.8 mmol/L (ref 3.5–5.1)
Sodium: 143 mmol/L (ref 135–145)

## 2023-03-15 LAB — CBC
HCT: 25.1 % — ABNORMAL LOW (ref 36.0–46.0)
Hemoglobin: 7.5 g/dL — ABNORMAL LOW (ref 12.0–15.0)
MCH: 30 pg (ref 26.0–34.0)
MCHC: 29.9 g/dL — ABNORMAL LOW (ref 30.0–36.0)
MCV: 100.4 fL — ABNORMAL HIGH (ref 80.0–100.0)
Platelets: 448 10*3/uL — ABNORMAL HIGH (ref 150–400)
RBC: 2.5 MIL/uL — ABNORMAL LOW (ref 3.87–5.11)
RDW: 18.4 % — ABNORMAL HIGH (ref 11.5–15.5)
WBC: 14.4 10*3/uL — ABNORMAL HIGH (ref 4.0–10.5)
nRBC: 0.2 % (ref 0.0–0.2)

## 2023-03-15 LAB — MAGNESIUM: Magnesium: 1.9 mg/dL (ref 1.7–2.4)

## 2023-03-16 ENCOUNTER — Other Ambulatory Visit (HOSPITAL_COMMUNITY): Payer: Self-pay

## 2023-03-16 DIAGNOSIS — G7281 Critical illness myopathy: Secondary | ICD-10-CM

## 2023-03-16 DIAGNOSIS — G9341 Metabolic encephalopathy: Secondary | ICD-10-CM

## 2023-03-16 DIAGNOSIS — Z930 Tracheostomy status: Secondary | ICD-10-CM

## 2023-03-16 DIAGNOSIS — J9621 Acute and chronic respiratory failure with hypoxia: Secondary | ICD-10-CM

## 2023-03-16 DIAGNOSIS — R6520 Severe sepsis without septic shock: Secondary | ICD-10-CM

## 2023-03-16 LAB — URINALYSIS, ROUTINE W REFLEX MICROSCOPIC
Bilirubin Urine: NEGATIVE
Glucose, UA: 150 mg/dL — AB
Ketones, ur: NEGATIVE mg/dL
Nitrite: POSITIVE — AB
Protein, ur: 100 mg/dL — AB
Specific Gravity, Urine: 1.013 (ref 1.005–1.030)
WBC, UA: >50 WBC/hpf (ref 0–5)
pH: 7 (ref 5.0–8.0)

## 2023-03-17 DIAGNOSIS — R6520 Severe sepsis without septic shock: Secondary | ICD-10-CM | POA: Diagnosis not present

## 2023-03-17 DIAGNOSIS — G7281 Critical illness myopathy: Secondary | ICD-10-CM

## 2023-03-17 DIAGNOSIS — J9621 Acute and chronic respiratory failure with hypoxia: Secondary | ICD-10-CM

## 2023-03-17 DIAGNOSIS — Z930 Tracheostomy status: Secondary | ICD-10-CM | POA: Diagnosis not present

## 2023-03-17 DIAGNOSIS — G9341 Metabolic encephalopathy: Secondary | ICD-10-CM

## 2023-03-17 LAB — CBC WITH DIFFERENTIAL/PLATELET
Abs Immature Granulocytes: 0.13 10*3/uL — ABNORMAL HIGH (ref 0.00–0.07)
Basophils Absolute: 0 10*3/uL (ref 0.0–0.1)
Basophils Relative: 0 %
Eosinophils Absolute: 0.4 10*3/uL (ref 0.0–0.5)
Eosinophils Relative: 3 %
HCT: 25.7 % — ABNORMAL LOW (ref 36.0–46.0)
Hemoglobin: 7.4 g/dL — ABNORMAL LOW (ref 12.0–15.0)
Immature Granulocytes: 1 %
Lymphocytes Relative: 7 %
Lymphs Abs: 0.9 10*3/uL (ref 0.7–4.0)
MCH: 29.7 pg (ref 26.0–34.0)
MCHC: 28.8 g/dL — ABNORMAL LOW (ref 30.0–36.0)
MCV: 103.2 fL — ABNORMAL HIGH (ref 80.0–100.0)
Monocytes Absolute: 0.8 10*3/uL (ref 0.1–1.0)
Monocytes Relative: 6 %
Neutro Abs: 10.8 10*3/uL — ABNORMAL HIGH (ref 1.7–7.7)
Neutrophils Relative %: 83 %
Platelets: 414 10*3/uL — ABNORMAL HIGH (ref 150–400)
RBC: 2.49 MIL/uL — ABNORMAL LOW (ref 3.87–5.11)
RDW: 17.8 % — ABNORMAL HIGH (ref 11.5–15.5)
WBC: 12.9 10*3/uL — ABNORMAL HIGH (ref 4.0–10.5)
nRBC: 0.2 % (ref 0.0–0.2)

## 2023-03-17 LAB — COMPREHENSIVE METABOLIC PANEL
ALT: 59 U/L — ABNORMAL HIGH (ref 0–44)
AST: 68 U/L — ABNORMAL HIGH (ref 15–41)
Albumin: 1.8 g/dL — ABNORMAL LOW (ref 3.5–5.0)
Alkaline Phosphatase: 130 U/L — ABNORMAL HIGH (ref 38–126)
Anion gap: 12 (ref 5–15)
BUN: 85 mg/dL — ABNORMAL HIGH (ref 6–20)
CO2: 30 mmol/L (ref 22–32)
Calcium: 9.9 mg/dL (ref 8.9–10.3)
Chloride: 102 mmol/L (ref 98–111)
Creatinine, Ser: 1.32 mg/dL — ABNORMAL HIGH (ref 0.44–1.00)
GFR, Estimated: 46 mL/min — ABNORMAL LOW (ref >60)
Glucose, Bld: 271 mg/dL — ABNORMAL HIGH (ref 70–99)
Potassium: 5 mmol/L (ref 3.5–5.1)
Sodium: 144 mmol/L (ref 135–145)
Total Bilirubin: 0.5 mg/dL (ref <1.2)
Total Protein: 6.1 g/dL — ABNORMAL LOW (ref 6.5–8.1)

## 2023-03-18 DIAGNOSIS — G7281 Critical illness myopathy: Secondary | ICD-10-CM | POA: Diagnosis not present

## 2023-03-18 DIAGNOSIS — J9621 Acute and chronic respiratory failure with hypoxia: Secondary | ICD-10-CM

## 2023-03-18 DIAGNOSIS — Z930 Tracheostomy status: Secondary | ICD-10-CM

## 2023-03-18 DIAGNOSIS — R6520 Severe sepsis without septic shock: Secondary | ICD-10-CM

## 2023-03-18 DIAGNOSIS — G9341 Metabolic encephalopathy: Secondary | ICD-10-CM

## 2023-03-19 ENCOUNTER — Other Ambulatory Visit (HOSPITAL_COMMUNITY): Payer: Self-pay

## 2023-03-19 DIAGNOSIS — G7281 Critical illness myopathy: Secondary | ICD-10-CM

## 2023-03-19 DIAGNOSIS — J9621 Acute and chronic respiratory failure with hypoxia: Secondary | ICD-10-CM

## 2023-03-19 DIAGNOSIS — G9341 Metabolic encephalopathy: Secondary | ICD-10-CM

## 2023-03-19 DIAGNOSIS — R6520 Severe sepsis without septic shock: Secondary | ICD-10-CM | POA: Diagnosis not present

## 2023-03-19 DIAGNOSIS — Z930 Tracheostomy status: Secondary | ICD-10-CM

## 2023-03-19 LAB — BLOOD GAS, ARTERIAL
Acid-Base Excess: 12.8 mmol/L — ABNORMAL HIGH (ref 0.0–2.0)
Bicarbonate: 39.8 mmol/L — ABNORMAL HIGH (ref 20.0–28.0)
O2 Saturation: 98.2 %
Patient temperature: 36.7
pCO2 arterial: 59 mm[Hg] — ABNORMAL HIGH (ref 32–48)
pH, Arterial: 7.43 (ref 7.35–7.45)
pO2, Arterial: 71 mm[Hg] — ABNORMAL LOW (ref 83–108)

## 2023-03-19 LAB — HEMOGLOBIN A1C
Hgb A1c MFr Bld: 7.6 % — ABNORMAL HIGH (ref 4.8–5.6)
Mean Plasma Glucose: 171.42 mg/dL

## 2023-03-20 ENCOUNTER — Other Ambulatory Visit (HOSPITAL_COMMUNITY): Payer: Self-pay

## 2023-03-20 DIAGNOSIS — Z930 Tracheostomy status: Secondary | ICD-10-CM

## 2023-03-20 DIAGNOSIS — G9341 Metabolic encephalopathy: Secondary | ICD-10-CM

## 2023-03-20 DIAGNOSIS — R6520 Severe sepsis without septic shock: Secondary | ICD-10-CM

## 2023-03-20 DIAGNOSIS — G7281 Critical illness myopathy: Secondary | ICD-10-CM

## 2023-03-20 DIAGNOSIS — J9621 Acute and chronic respiratory failure with hypoxia: Secondary | ICD-10-CM

## 2023-03-20 HISTORY — PX: IR REMOVAL TUN CV CATH W/O FL: IMG2289

## 2023-03-20 MED ORDER — LIDOCAINE HCL 1 % IJ SOLN
20.0000 mL | Freq: Once | INTRAMUSCULAR | Status: AC
  Administered 2023-03-20: 10 mL

## 2023-03-21 ENCOUNTER — Other Ambulatory Visit (HOSPITAL_COMMUNITY): Payer: Self-pay

## 2023-03-21 DIAGNOSIS — G7281 Critical illness myopathy: Secondary | ICD-10-CM | POA: Diagnosis not present

## 2023-03-21 DIAGNOSIS — G9341 Metabolic encephalopathy: Secondary | ICD-10-CM

## 2023-03-21 DIAGNOSIS — R6520 Severe sepsis without septic shock: Secondary | ICD-10-CM | POA: Diagnosis not present

## 2023-03-21 DIAGNOSIS — Z930 Tracheostomy status: Secondary | ICD-10-CM | POA: Diagnosis not present

## 2023-03-21 DIAGNOSIS — J9621 Acute and chronic respiratory failure with hypoxia: Secondary | ICD-10-CM | POA: Diagnosis not present

## 2023-03-21 LAB — CBC
HCT: 21.5 % — ABNORMAL LOW (ref 36.0–46.0)
HCT: 31.7 % — ABNORMAL LOW (ref 36.0–46.0)
Hemoglobin: 5.9 g/dL — CL (ref 12.0–15.0)
Hemoglobin: 9.3 g/dL — ABNORMAL LOW (ref 12.0–15.0)
MCH: 28.4 pg (ref 26.0–34.0)
MCH: 28.2 pg (ref 26.0–34.0)
MCHC: 27.4 g/dL — ABNORMAL LOW (ref 30.0–36.0)
MCHC: 29.3 g/dL — ABNORMAL LOW (ref 30.0–36.0)
MCV: 103.4 fL — ABNORMAL HIGH (ref 80.0–100.0)
MCV: 96.1 fL (ref 80.0–100.0)
Platelets: 416 10*3/uL — ABNORMAL HIGH (ref 150–400)
Platelets: 489 10*3/uL — ABNORMAL HIGH (ref 150–400)
RBC: 2.08 MIL/uL — ABNORMAL LOW (ref 3.87–5.11)
RBC: 3.3 MIL/uL — ABNORMAL LOW (ref 3.87–5.11)
RDW: 17.5 % — ABNORMAL HIGH (ref 11.5–15.5)
RDW: 20.9 % — ABNORMAL HIGH (ref 11.5–15.5)
WBC: 11.4 10*3/uL — ABNORMAL HIGH (ref 4.0–10.5)
WBC: 20.4 10*3/uL — ABNORMAL HIGH (ref 4.0–10.5)
nRBC: 0.3 % — ABNORMAL HIGH (ref 0.0–0.2)
nRBC: 0.5 % — ABNORMAL HIGH (ref 0.0–0.2)

## 2023-03-21 LAB — BRAIN NATRIURETIC PEPTIDE: B Natriuretic Peptide: 328.3 pg/mL — ABNORMAL HIGH (ref 0.0–100.0)

## 2023-03-21 LAB — TSH: TSH: 12.441 u[IU]/mL — ABNORMAL HIGH (ref 0.350–4.500)

## 2023-03-21 LAB — BLOOD GAS, ARTERIAL
Acid-Base Excess: 10.7 mmol/L — ABNORMAL HIGH (ref 0.0–2.0)
Bicarbonate: 37.2 mmol/L — ABNORMAL HIGH (ref 20.0–28.0)
O2 Saturation: 98.7 %
Patient temperature: 37
pCO2 arterial: 56 mm[Hg] — ABNORMAL HIGH (ref 32–48)
pH, Arterial: 7.43 (ref 7.35–7.45)
pO2, Arterial: 96 mm[Hg] (ref 83–108)

## 2023-03-21 LAB — COMPREHENSIVE METABOLIC PANEL
ALT: 104 U/L — ABNORMAL HIGH (ref 0–44)
AST: 71 U/L — ABNORMAL HIGH (ref 15–41)
Albumin: 2 g/dL — ABNORMAL LOW (ref 3.5–5.0)
Alkaline Phosphatase: 121 U/L (ref 38–126)
Anion gap: 15 (ref 5–15)
BUN: 58 mg/dL — ABNORMAL HIGH (ref 6–20)
CO2: 28 mmol/L (ref 22–32)
Calcium: 10.4 mg/dL — ABNORMAL HIGH (ref 8.9–10.3)
Chloride: 108 mmol/L (ref 98–111)
Creatinine, Ser: 0.79 mg/dL (ref 0.44–1.00)
GFR, Estimated: >60 mL/min (ref >60)
Glucose, Bld: 257 mg/dL — ABNORMAL HIGH (ref 70–99)
Potassium: 3.3 mmol/L — ABNORMAL LOW (ref 3.5–5.1)
Sodium: 151 mmol/L — ABNORMAL HIGH (ref 135–145)
Total Bilirubin: 0.8 mg/dL (ref <1.2)
Total Protein: 6.7 g/dL (ref 6.5–8.1)

## 2023-03-21 LAB — URINE CULTURE: Culture: NO GROWTH

## 2023-03-21 LAB — BASIC METABOLIC PANEL
Anion gap: 12 (ref 5–15)
BUN: 64 mg/dL — ABNORMAL HIGH (ref 6–20)
CO2: 34 mmol/L — ABNORMAL HIGH (ref 22–32)
Calcium: 10.3 mg/dL (ref 8.9–10.3)
Chloride: 109 mmol/L (ref 98–111)
Creatinine, Ser: 0.75 mg/dL (ref 0.44–1.00)
GFR, Estimated: >60 mL/min (ref >60)
Glucose, Bld: 295 mg/dL — ABNORMAL HIGH (ref 70–99)
Potassium: 3.4 mmol/L — ABNORMAL LOW (ref 3.5–5.1)
Sodium: 155 mmol/L — ABNORMAL HIGH (ref 135–145)

## 2023-03-21 LAB — TROPONIN I (HIGH SENSITIVITY): Troponin I (High Sensitivity): 37 ng/L — ABNORMAL HIGH (ref <18)

## 2023-03-21 LAB — MAGNESIUM: Magnesium: 1.9 mg/dL (ref 1.7–2.4)

## 2023-03-21 LAB — PREPARE RBC (CROSSMATCH)

## 2023-03-21 LAB — PHOSPHORUS: Phosphorus: 2.5 mg/dL (ref 2.5–4.6)

## 2023-03-22 ENCOUNTER — Other Ambulatory Visit (HOSPITAL_COMMUNITY): Payer: Self-pay

## 2023-03-22 DIAGNOSIS — J9621 Acute and chronic respiratory failure with hypoxia: Secondary | ICD-10-CM

## 2023-03-22 DIAGNOSIS — R6520 Severe sepsis without septic shock: Secondary | ICD-10-CM | POA: Diagnosis not present

## 2023-03-22 DIAGNOSIS — G7281 Critical illness myopathy: Secondary | ICD-10-CM | POA: Diagnosis not present

## 2023-03-22 DIAGNOSIS — G9341 Metabolic encephalopathy: Secondary | ICD-10-CM

## 2023-03-22 DIAGNOSIS — Z930 Tracheostomy status: Secondary | ICD-10-CM | POA: Diagnosis not present

## 2023-03-22 LAB — CULTURE, RESPIRATORY W GRAM STAIN

## 2023-03-22 LAB — BASIC METABOLIC PANEL
Anion gap: 10 (ref 5–15)
BUN: 54 mg/dL — ABNORMAL HIGH (ref 6–20)
CO2: 30 mmol/L (ref 22–32)
Calcium: 10.1 mg/dL (ref 8.9–10.3)
Chloride: 110 mmol/L (ref 98–111)
Creatinine, Ser: 0.82 mg/dL (ref 0.44–1.00)
GFR, Estimated: >60 mL/min (ref >60)
Glucose, Bld: 349 mg/dL — ABNORMAL HIGH (ref 70–99)
Potassium: 3.3 mmol/L — ABNORMAL LOW (ref 3.5–5.1)
Sodium: 150 mmol/L — ABNORMAL HIGH (ref 135–145)

## 2023-03-22 LAB — CBC
HCT: 30.2 % — ABNORMAL LOW (ref 36.0–46.0)
Hemoglobin: 9 g/dL — ABNORMAL LOW (ref 12.0–15.0)
MCH: 28 pg (ref 26.0–34.0)
MCHC: 29.8 g/dL — ABNORMAL LOW (ref 30.0–36.0)
MCV: 94.1 fL (ref 80.0–100.0)
Platelets: 497 10*3/uL — ABNORMAL HIGH (ref 150–400)
RBC: 3.21 MIL/uL — ABNORMAL LOW (ref 3.87–5.11)
RDW: 20.3 % — ABNORMAL HIGH (ref 11.5–15.5)
WBC: 16 10*3/uL — ABNORMAL HIGH (ref 4.0–10.5)
nRBC: 0.6 % — ABNORMAL HIGH (ref 0.0–0.2)

## 2023-03-22 LAB — TYPE AND SCREEN
ABO/RH(D): O POS
Antibody Screen: NEGATIVE
Unit division: 0

## 2023-03-22 LAB — BPAM RBC
Blood Product Expiration Date: 202412082359
ISSUE DATE / TIME: 202411151234
Unit Type and Rh: 5100

## 2023-03-22 LAB — BRAIN NATRIURETIC PEPTIDE: B Natriuretic Peptide: 336.3 pg/mL — ABNORMAL HIGH (ref 0.0–100.0)

## 2023-03-22 LAB — LEVETIRACETAM LEVEL: Levetiracetam Lvl: 24.7 ug/mL (ref 10.0–40.0)

## 2023-03-23 ENCOUNTER — Other Ambulatory Visit (HOSPITAL_COMMUNITY): Payer: Self-pay

## 2023-03-23 DIAGNOSIS — R6520 Severe sepsis without septic shock: Secondary | ICD-10-CM

## 2023-03-23 DIAGNOSIS — G7281 Critical illness myopathy: Secondary | ICD-10-CM | POA: Diagnosis not present

## 2023-03-23 DIAGNOSIS — Z930 Tracheostomy status: Secondary | ICD-10-CM | POA: Diagnosis not present

## 2023-03-23 DIAGNOSIS — J9621 Acute and chronic respiratory failure with hypoxia: Secondary | ICD-10-CM

## 2023-03-23 DIAGNOSIS — G9341 Metabolic encephalopathy: Secondary | ICD-10-CM

## 2023-03-23 LAB — COMPREHENSIVE METABOLIC PANEL
ALT: 66 U/L — ABNORMAL HIGH (ref 0–44)
AST: 49 U/L — ABNORMAL HIGH (ref 15–41)
Albumin: 1.7 g/dL — ABNORMAL LOW (ref 3.5–5.0)
Alkaline Phosphatase: 101 U/L (ref 38–126)
Anion gap: 10 (ref 5–15)
BUN: 43 mg/dL — ABNORMAL HIGH (ref 6–20)
CO2: 29 mmol/L (ref 22–32)
Calcium: 9.8 mg/dL (ref 8.9–10.3)
Chloride: 111 mmol/L (ref 98–111)
Creatinine, Ser: 0.69 mg/dL (ref 0.44–1.00)
GFR, Estimated: >60 mL/min (ref >60)
Glucose, Bld: 372 mg/dL — ABNORMAL HIGH (ref 70–99)
Potassium: 3.1 mmol/L — ABNORMAL LOW (ref 3.5–5.1)
Sodium: 150 mmol/L — ABNORMAL HIGH (ref 135–145)
Total Bilirubin: 0.4 mg/dL (ref <1.2)
Total Protein: 6.2 g/dL — ABNORMAL LOW (ref 6.5–8.1)

## 2023-03-23 LAB — CBC WITH DIFFERENTIAL/PLATELET
Abs Immature Granulocytes: 0.13 10*3/uL — ABNORMAL HIGH (ref 0.00–0.07)
Basophils Absolute: 0.1 10*3/uL (ref 0.0–0.1)
Basophils Relative: 1 %
Eosinophils Absolute: 0.2 10*3/uL (ref 0.0–0.5)
Eosinophils Relative: 2 %
HCT: 29.7 % — ABNORMAL LOW (ref 36.0–46.0)
Hemoglobin: 8.7 g/dL — ABNORMAL LOW (ref 12.0–15.0)
Immature Granulocytes: 1 %
Lymphocytes Relative: 10 %
Lymphs Abs: 1.2 10*3/uL (ref 0.7–4.0)
MCH: 27.8 pg (ref 26.0–34.0)
MCHC: 29.3 g/dL — ABNORMAL LOW (ref 30.0–36.0)
MCV: 94.9 fL (ref 80.0–100.0)
Monocytes Absolute: 0.7 10*3/uL (ref 0.1–1.0)
Monocytes Relative: 5 %
Neutro Abs: 10.1 10*3/uL — ABNORMAL HIGH (ref 1.7–7.7)
Neutrophils Relative %: 81 %
Platelets: 407 10*3/uL — ABNORMAL HIGH (ref 150–400)
RBC: 3.13 MIL/uL — ABNORMAL LOW (ref 3.87–5.11)
RDW: 19.7 % — ABNORMAL HIGH (ref 11.5–15.5)
WBC: 12.2 10*3/uL — ABNORMAL HIGH (ref 4.0–10.5)
nRBC: 0.3 % — ABNORMAL HIGH (ref 0.0–0.2)

## 2023-03-23 LAB — MAGNESIUM: Magnesium: 1.9 mg/dL (ref 1.7–2.4)

## 2023-03-23 LAB — OCCULT BLOOD X 1 CARD TO LAB, STOOL: Fecal Occult Bld: NEGATIVE

## 2023-03-24 ENCOUNTER — Other Ambulatory Visit (HOSPITAL_COMMUNITY): Payer: Self-pay

## 2023-03-24 DIAGNOSIS — J9621 Acute and chronic respiratory failure with hypoxia: Secondary | ICD-10-CM

## 2023-03-24 DIAGNOSIS — Z930 Tracheostomy status: Secondary | ICD-10-CM

## 2023-03-24 DIAGNOSIS — G7281 Critical illness myopathy: Secondary | ICD-10-CM | POA: Diagnosis not present

## 2023-03-24 DIAGNOSIS — R6520 Severe sepsis without septic shock: Secondary | ICD-10-CM | POA: Diagnosis not present

## 2023-03-24 DIAGNOSIS — G9341 Metabolic encephalopathy: Secondary | ICD-10-CM

## 2023-03-24 LAB — BASIC METABOLIC PANEL
Anion gap: 9 (ref 5–15)
BUN: 43 mg/dL — ABNORMAL HIGH (ref 6–20)
CO2: 29 mmol/L (ref 22–32)
Calcium: 10.5 mg/dL — ABNORMAL HIGH (ref 8.9–10.3)
Chloride: 109 mmol/L (ref 98–111)
Creatinine, Ser: 0.88 mg/dL (ref 0.44–1.00)
GFR, Estimated: >60 mL/min (ref >60)
Glucose, Bld: 394 mg/dL — ABNORMAL HIGH (ref 70–99)
Potassium: 3.5 mmol/L (ref 3.5–5.1)
Sodium: 147 mmol/L — ABNORMAL HIGH (ref 135–145)

## 2023-03-24 LAB — CBC WITH DIFFERENTIAL/PLATELET
Abs Immature Granulocytes: 0.22 10*3/uL — ABNORMAL HIGH (ref 0.00–0.07)
Basophils Absolute: 0.1 10*3/uL (ref 0.0–0.1)
Basophils Relative: 1 %
Eosinophils Absolute: 0.4 10*3/uL (ref 0.0–0.5)
Eosinophils Relative: 3 %
HCT: 30.6 % — ABNORMAL LOW (ref 36.0–46.0)
Hemoglobin: 9.3 g/dL — ABNORMAL LOW (ref 12.0–15.0)
Immature Granulocytes: 2 %
Lymphocytes Relative: 9 %
Lymphs Abs: 1.2 10*3/uL (ref 0.7–4.0)
MCH: 29.2 pg (ref 26.0–34.0)
MCHC: 30.4 g/dL (ref 30.0–36.0)
MCV: 96.2 fL (ref 80.0–100.0)
Monocytes Absolute: 0.6 10*3/uL (ref 0.1–1.0)
Monocytes Relative: 5 %
Neutro Abs: 10.5 10*3/uL — ABNORMAL HIGH (ref 1.7–7.7)
Neutrophils Relative %: 80 %
Platelets: ADEQUATE 10*3/uL (ref 150–400)
RBC: 3.18 MIL/uL — ABNORMAL LOW (ref 3.87–5.11)
RDW: 19.8 % — ABNORMAL HIGH (ref 11.5–15.5)
WBC: 12.9 10*3/uL — ABNORMAL HIGH (ref 4.0–10.5)
nRBC: 0.5 % — ABNORMAL HIGH (ref 0.0–0.2)

## 2023-03-25 DIAGNOSIS — R6520 Severe sepsis without septic shock: Secondary | ICD-10-CM | POA: Diagnosis not present

## 2023-03-25 DIAGNOSIS — G7281 Critical illness myopathy: Secondary | ICD-10-CM | POA: Diagnosis not present

## 2023-03-25 DIAGNOSIS — Z930 Tracheostomy status: Secondary | ICD-10-CM | POA: Diagnosis not present

## 2023-03-25 DIAGNOSIS — J9621 Acute and chronic respiratory failure with hypoxia: Secondary | ICD-10-CM | POA: Diagnosis not present

## 2023-03-25 DIAGNOSIS — G9341 Metabolic encephalopathy: Secondary | ICD-10-CM

## 2023-03-25 LAB — VANCOMYCIN, TROUGH: Vancomycin Tr: 8 ug/mL — ABNORMAL LOW (ref 15–20)

## 2023-03-26 DIAGNOSIS — J9621 Acute and chronic respiratory failure with hypoxia: Secondary | ICD-10-CM | POA: Diagnosis not present

## 2023-03-26 DIAGNOSIS — G9341 Metabolic encephalopathy: Secondary | ICD-10-CM

## 2023-03-26 DIAGNOSIS — G7281 Critical illness myopathy: Secondary | ICD-10-CM | POA: Diagnosis not present

## 2023-03-26 DIAGNOSIS — R6520 Severe sepsis without septic shock: Secondary | ICD-10-CM | POA: Diagnosis not present

## 2023-03-26 DIAGNOSIS — Z930 Tracheostomy status: Secondary | ICD-10-CM | POA: Diagnosis not present

## 2023-03-26 LAB — CBC WITH DIFFERENTIAL/PLATELET
Abs Immature Granulocytes: 0.14 10*3/uL — ABNORMAL HIGH (ref 0.00–0.07)
Basophils Absolute: 0 10*3/uL (ref 0.0–0.1)
Basophils Relative: 0 %
Eosinophils Absolute: 0.4 10*3/uL (ref 0.0–0.5)
Eosinophils Relative: 4 %
HCT: 28.1 % — ABNORMAL LOW (ref 36.0–46.0)
Hemoglobin: 8.5 g/dL — ABNORMAL LOW (ref 12.0–15.0)
Immature Granulocytes: 1 %
Lymphocytes Relative: 12 %
Lymphs Abs: 1.2 10*3/uL (ref 0.7–4.0)
MCH: 28.6 pg (ref 26.0–34.0)
MCHC: 30.2 g/dL (ref 30.0–36.0)
MCV: 94.6 fL (ref 80.0–100.0)
Monocytes Absolute: 0.5 10*3/uL (ref 0.1–1.0)
Monocytes Relative: 4 %
Neutro Abs: 7.9 10*3/uL — ABNORMAL HIGH (ref 1.7–7.7)
Neutrophils Relative %: 79 %
Platelets: 274 10*3/uL (ref 150–400)
RBC: 2.97 MIL/uL — ABNORMAL LOW (ref 3.87–5.11)
RDW: 18.9 % — ABNORMAL HIGH (ref 11.5–15.5)
WBC: 10.2 10*3/uL (ref 4.0–10.5)
nRBC: 0.3 % — ABNORMAL HIGH (ref 0.0–0.2)

## 2023-03-26 LAB — BASIC METABOLIC PANEL
Anion gap: 9 (ref 5–15)
BUN: 48 mg/dL — ABNORMAL HIGH (ref 6–20)
CO2: 28 mmol/L (ref 22–32)
Calcium: 11.1 mg/dL — ABNORMAL HIGH (ref 8.9–10.3)
Chloride: 109 mmol/L (ref 98–111)
Creatinine, Ser: 0.62 mg/dL (ref 0.44–1.00)
GFR, Estimated: >60 mL/min (ref >60)
Glucose, Bld: 171 mg/dL — ABNORMAL HIGH (ref 70–99)
Potassium: 3.8 mmol/L (ref 3.5–5.1)
Sodium: 146 mmol/L — ABNORMAL HIGH (ref 135–145)

## 2023-03-27 DIAGNOSIS — R6520 Severe sepsis without septic shock: Secondary | ICD-10-CM | POA: Diagnosis not present

## 2023-03-27 DIAGNOSIS — G9341 Metabolic encephalopathy: Secondary | ICD-10-CM

## 2023-03-27 DIAGNOSIS — G7281 Critical illness myopathy: Secondary | ICD-10-CM | POA: Diagnosis not present

## 2023-03-27 DIAGNOSIS — J9621 Acute and chronic respiratory failure with hypoxia: Secondary | ICD-10-CM | POA: Diagnosis not present

## 2023-03-27 DIAGNOSIS — Z930 Tracheostomy status: Secondary | ICD-10-CM

## 2023-03-27 LAB — CULTURE, BLOOD (ROUTINE X 2)
Culture: NO GROWTH
Special Requests: ADEQUATE

## 2023-03-28 DIAGNOSIS — G7281 Critical illness myopathy: Secondary | ICD-10-CM

## 2023-03-28 DIAGNOSIS — Z930 Tracheostomy status: Secondary | ICD-10-CM | POA: Diagnosis not present

## 2023-03-28 DIAGNOSIS — J9621 Acute and chronic respiratory failure with hypoxia: Secondary | ICD-10-CM

## 2023-03-28 DIAGNOSIS — G9341 Metabolic encephalopathy: Secondary | ICD-10-CM

## 2023-03-28 DIAGNOSIS — R6520 Severe sepsis without septic shock: Secondary | ICD-10-CM

## 2023-03-29 DIAGNOSIS — G7281 Critical illness myopathy: Secondary | ICD-10-CM

## 2023-03-29 DIAGNOSIS — G9341 Metabolic encephalopathy: Secondary | ICD-10-CM

## 2023-03-29 DIAGNOSIS — R6520 Severe sepsis without septic shock: Secondary | ICD-10-CM | POA: Diagnosis not present

## 2023-03-29 DIAGNOSIS — Z930 Tracheostomy status: Secondary | ICD-10-CM

## 2023-03-29 DIAGNOSIS — J9621 Acute and chronic respiratory failure with hypoxia: Secondary | ICD-10-CM | POA: Diagnosis not present

## 2023-03-29 LAB — CBC WITH DIFFERENTIAL/PLATELET
Abs Immature Granulocytes: 0.55 10*3/uL — ABNORMAL HIGH (ref 0.00–0.07)
Basophils Absolute: 0.1 10*3/uL (ref 0.0–0.1)
Basophils Relative: 1 %
Eosinophils Absolute: 0.3 10*3/uL (ref 0.0–0.5)
Eosinophils Relative: 3 %
HCT: 26.3 % — ABNORMAL LOW (ref 36.0–46.0)
Hemoglobin: 7.6 g/dL — ABNORMAL LOW (ref 12.0–15.0)
Immature Granulocytes: 4 %
Lymphocytes Relative: 10 %
Lymphs Abs: 1.3 10*3/uL (ref 0.7–4.0)
MCH: 27.6 pg (ref 26.0–34.0)
MCHC: 28.9 g/dL — ABNORMAL LOW (ref 30.0–36.0)
MCV: 95.6 fL (ref 80.0–100.0)
Monocytes Absolute: 0.8 10*3/uL (ref 0.1–1.0)
Monocytes Relative: 6 %
Neutro Abs: 10.3 10*3/uL — ABNORMAL HIGH (ref 1.7–7.7)
Neutrophils Relative %: 76 %
Platelets: ADEQUATE 10*3/uL (ref 150–400)
RBC: 2.75 MIL/uL — ABNORMAL LOW (ref 3.87–5.11)
RDW: 18.7 % — ABNORMAL HIGH (ref 11.5–15.5)
WBC: 13.3 10*3/uL — ABNORMAL HIGH (ref 4.0–10.5)
nRBC: 0.2 % (ref 0.0–0.2)

## 2023-03-29 LAB — BASIC METABOLIC PANEL
Anion gap: 9 (ref 5–15)
BUN: 43 mg/dL — ABNORMAL HIGH (ref 6–20)
CO2: 27 mmol/L (ref 22–32)
Calcium: 10.6 mg/dL — ABNORMAL HIGH (ref 8.9–10.3)
Chloride: 104 mmol/L (ref 98–111)
Creatinine, Ser: 0.52 mg/dL (ref 0.44–1.00)
GFR, Estimated: >60 mL/min (ref >60)
Glucose, Bld: 271 mg/dL — ABNORMAL HIGH (ref 70–99)
Potassium: 4.7 mmol/L (ref 3.5–5.1)
Sodium: 140 mmol/L (ref 135–145)

## 2023-03-29 LAB — PHOSPHORUS: Phosphorus: 2.1 mg/dL — ABNORMAL LOW (ref 2.5–4.6)

## 2023-03-29 LAB — MAGNESIUM: Magnesium: 1.8 mg/dL (ref 1.7–2.4)

## 2023-03-30 DIAGNOSIS — J9621 Acute and chronic respiratory failure with hypoxia: Secondary | ICD-10-CM

## 2023-03-30 DIAGNOSIS — G7281 Critical illness myopathy: Secondary | ICD-10-CM

## 2023-03-30 DIAGNOSIS — Z930 Tracheostomy status: Secondary | ICD-10-CM | POA: Diagnosis not present

## 2023-03-30 DIAGNOSIS — G9341 Metabolic encephalopathy: Secondary | ICD-10-CM

## 2023-03-30 DIAGNOSIS — R6520 Severe sepsis without septic shock: Secondary | ICD-10-CM | POA: Diagnosis not present

## 2023-03-30 LAB — BASIC METABOLIC PANEL
Anion gap: 11 (ref 5–15)
BUN: 30 mg/dL — ABNORMAL HIGH (ref 6–20)
CO2: 25 mmol/L (ref 22–32)
Calcium: 10.6 mg/dL — ABNORMAL HIGH (ref 8.9–10.3)
Chloride: 103 mmol/L (ref 98–111)
Creatinine, Ser: 0.55 mg/dL (ref 0.44–1.00)
GFR, Estimated: >60 mL/min (ref >60)
Glucose, Bld: 258 mg/dL — ABNORMAL HIGH (ref 70–99)
Potassium: 4 mmol/L (ref 3.5–5.1)
Sodium: 139 mmol/L (ref 135–145)

## 2023-03-30 LAB — CBC WITH DIFFERENTIAL/PLATELET
Abs Immature Granulocytes: 0.2 10*3/uL — ABNORMAL HIGH (ref 0.00–0.07)
Basophils Absolute: 0 10*3/uL (ref 0.0–0.1)
Basophils Relative: 1 %
Eosinophils Absolute: 0.2 10*3/uL (ref 0.0–0.5)
Eosinophils Relative: 3 %
HCT: 29.1 % — ABNORMAL LOW (ref 36.0–46.0)
Hemoglobin: 8.4 g/dL — ABNORMAL LOW (ref 12.0–15.0)
Immature Granulocytes: 3 %
Lymphocytes Relative: 9 %
Lymphs Abs: 0.8 10*3/uL (ref 0.7–4.0)
MCH: 28.8 pg (ref 26.0–34.0)
MCHC: 28.9 g/dL — ABNORMAL LOW (ref 30.0–36.0)
MCV: 99.7 fL (ref 80.0–100.0)
Monocytes Absolute: 0.7 10*3/uL (ref 0.1–1.0)
Monocytes Relative: 8 %
Neutro Abs: 6.2 10*3/uL (ref 1.7–7.7)
Neutrophils Relative %: 76 %
Platelets: 269 10*3/uL (ref 150–400)
RBC: 2.92 MIL/uL — ABNORMAL LOW (ref 3.87–5.11)
RDW: 18.6 % — ABNORMAL HIGH (ref 11.5–15.5)
WBC: 8.1 10*3/uL (ref 4.0–10.5)
nRBC: 0.2 % (ref 0.0–0.2)

## 2023-03-31 DIAGNOSIS — Z930 Tracheostomy status: Secondary | ICD-10-CM

## 2023-03-31 DIAGNOSIS — G7281 Critical illness myopathy: Secondary | ICD-10-CM

## 2023-03-31 DIAGNOSIS — G9341 Metabolic encephalopathy: Secondary | ICD-10-CM

## 2023-03-31 DIAGNOSIS — J9621 Acute and chronic respiratory failure with hypoxia: Secondary | ICD-10-CM | POA: Diagnosis not present

## 2023-03-31 DIAGNOSIS — R6520 Severe sepsis without septic shock: Secondary | ICD-10-CM

## 2023-03-31 LAB — CBC
HCT: 28.6 % — ABNORMAL LOW (ref 36.0–46.0)
Hemoglobin: 8.2 g/dL — ABNORMAL LOW (ref 12.0–15.0)
MCH: 27.9 pg (ref 26.0–34.0)
MCHC: 28.7 g/dL — ABNORMAL LOW (ref 30.0–36.0)
MCV: 97.3 fL (ref 80.0–100.0)
Platelets: 271 10*3/uL (ref 150–400)
RBC: 2.94 MIL/uL — ABNORMAL LOW (ref 3.87–5.11)
RDW: 18.7 % — ABNORMAL HIGH (ref 11.5–15.5)
WBC: 8.9 10*3/uL (ref 4.0–10.5)
nRBC: 0.2 % (ref 0.0–0.2)

## 2023-03-31 LAB — BASIC METABOLIC PANEL
Anion gap: 8 (ref 5–15)
BUN: 26 mg/dL — ABNORMAL HIGH (ref 6–20)
CO2: 29 mmol/L (ref 22–32)
Calcium: 11 mg/dL — ABNORMAL HIGH (ref 8.9–10.3)
Chloride: 102 mmol/L (ref 98–111)
Creatinine, Ser: 0.73 mg/dL (ref 0.44–1.00)
GFR, Estimated: >60 mL/min (ref >60)
Glucose, Bld: 105 mg/dL — ABNORMAL HIGH (ref 70–99)
Potassium: 4.1 mmol/L (ref 3.5–5.1)
Sodium: 139 mmol/L (ref 135–145)

## 2023-04-01 ENCOUNTER — Other Ambulatory Visit (HOSPITAL_COMMUNITY): Payer: Self-pay

## 2023-04-01 DIAGNOSIS — G7281 Critical illness myopathy: Secondary | ICD-10-CM | POA: Diagnosis not present

## 2023-04-01 DIAGNOSIS — Z930 Tracheostomy status: Secondary | ICD-10-CM

## 2023-04-01 DIAGNOSIS — G9341 Metabolic encephalopathy: Secondary | ICD-10-CM

## 2023-04-01 DIAGNOSIS — J9621 Acute and chronic respiratory failure with hypoxia: Secondary | ICD-10-CM | POA: Diagnosis not present

## 2023-04-01 DIAGNOSIS — R6520 Severe sepsis without septic shock: Secondary | ICD-10-CM

## 2023-04-01 LAB — CBC WITH DIFFERENTIAL/PLATELET
Abs Immature Granulocytes: 0.21 10*3/uL — ABNORMAL HIGH (ref 0.00–0.07)
Basophils Absolute: 0.1 10*3/uL (ref 0.0–0.1)
Basophils Relative: 1 %
Eosinophils Absolute: 0.3 10*3/uL (ref 0.0–0.5)
Eosinophils Relative: 3 %
HCT: 29 % — ABNORMAL LOW (ref 36.0–46.0)
Hemoglobin: 8.7 g/dL — ABNORMAL LOW (ref 12.0–15.0)
Immature Granulocytes: 2 %
Lymphocytes Relative: 9 %
Lymphs Abs: 1 10*3/uL (ref 0.7–4.0)
MCH: 28.8 pg (ref 26.0–34.0)
MCHC: 30 g/dL (ref 30.0–36.0)
MCV: 96 fL (ref 80.0–100.0)
Monocytes Absolute: 0.6 10*3/uL (ref 0.1–1.0)
Monocytes Relative: 6 %
Neutro Abs: 8.5 10*3/uL — ABNORMAL HIGH (ref 1.7–7.7)
Neutrophils Relative %: 79 %
Platelets: 346 10*3/uL (ref 150–400)
RBC: 3.02 MIL/uL — ABNORMAL LOW (ref 3.87–5.11)
RDW: 18.4 % — ABNORMAL HIGH (ref 11.5–15.5)
WBC: 10.6 10*3/uL — ABNORMAL HIGH (ref 4.0–10.5)
nRBC: 0 % (ref 0.0–0.2)

## 2023-04-01 LAB — BASIC METABOLIC PANEL
Anion gap: 9 (ref 5–15)
BUN: 20 mg/dL (ref 6–20)
CO2: 26 mmol/L (ref 22–32)
Calcium: 10.6 mg/dL — ABNORMAL HIGH (ref 8.9–10.3)
Chloride: 104 mmol/L (ref 98–111)
Creatinine, Ser: 0.47 mg/dL (ref 0.44–1.00)
GFR, Estimated: >60 mL/min (ref >60)
Glucose, Bld: 205 mg/dL — ABNORMAL HIGH (ref 70–99)
Potassium: 4.1 mmol/L (ref 3.5–5.1)
Sodium: 139 mmol/L (ref 135–145)

## 2023-04-01 LAB — PHOSPHORUS: Phosphorus: 3.2 mg/dL (ref 2.5–4.6)

## 2023-04-01 LAB — MAGNESIUM: Magnesium: 1.8 mg/dL (ref 1.7–2.4)

## 2023-04-01 LAB — LACTIC ACID, PLASMA: Lactic Acid, Venous: 1.1 mmol/L (ref 0.5–1.9)

## 2023-04-02 DIAGNOSIS — R6520 Severe sepsis without septic shock: Secondary | ICD-10-CM

## 2023-04-02 DIAGNOSIS — J9621 Acute and chronic respiratory failure with hypoxia: Secondary | ICD-10-CM

## 2023-04-02 DIAGNOSIS — G9341 Metabolic encephalopathy: Secondary | ICD-10-CM

## 2023-04-02 DIAGNOSIS — Z930 Tracheostomy status: Secondary | ICD-10-CM

## 2023-04-02 DIAGNOSIS — G7281 Critical illness myopathy: Secondary | ICD-10-CM

## 2023-04-03 DIAGNOSIS — J9621 Acute and chronic respiratory failure with hypoxia: Secondary | ICD-10-CM

## 2023-04-03 DIAGNOSIS — R6520 Severe sepsis without septic shock: Secondary | ICD-10-CM

## 2023-04-03 DIAGNOSIS — G9341 Metabolic encephalopathy: Secondary | ICD-10-CM

## 2023-04-03 DIAGNOSIS — G7281 Critical illness myopathy: Secondary | ICD-10-CM

## 2023-04-03 DIAGNOSIS — Z930 Tracheostomy status: Secondary | ICD-10-CM

## 2023-04-03 LAB — BASIC METABOLIC PANEL
Anion gap: 5 (ref 5–15)
BUN: 28 mg/dL — ABNORMAL HIGH (ref 6–20)
CO2: 27 mmol/L (ref 22–32)
Calcium: 10.1 mg/dL (ref 8.9–10.3)
Chloride: 103 mmol/L (ref 98–111)
Creatinine, Ser: 0.34 mg/dL — ABNORMAL LOW (ref 0.44–1.00)
GFR, Estimated: >60 mL/min (ref >60)
Glucose, Bld: 136 mg/dL — ABNORMAL HIGH (ref 70–99)
Potassium: 3.8 mmol/L (ref 3.5–5.1)
Sodium: 135 mmol/L (ref 135–145)

## 2023-04-03 LAB — CBC WITH DIFFERENTIAL/PLATELET
Abs Immature Granulocytes: 0.13 10*3/uL — ABNORMAL HIGH (ref 0.00–0.07)
Basophils Absolute: 0 10*3/uL (ref 0.0–0.1)
Basophils Relative: 0 %
Eosinophils Absolute: 0.3 10*3/uL (ref 0.0–0.5)
Eosinophils Relative: 3 %
HCT: 27.2 % — ABNORMAL LOW (ref 36.0–46.0)
Hemoglobin: 7.8 g/dL — ABNORMAL LOW (ref 12.0–15.0)
Immature Granulocytes: 1 %
Lymphocytes Relative: 8 %
Lymphs Abs: 0.8 10*3/uL (ref 0.7–4.0)
MCH: 27.2 pg (ref 26.0–34.0)
MCHC: 28.7 g/dL — ABNORMAL LOW (ref 30.0–36.0)
MCV: 94.8 fL (ref 80.0–100.0)
Monocytes Absolute: 0.5 10*3/uL (ref 0.1–1.0)
Monocytes Relative: 5 %
Neutro Abs: 7.8 10*3/uL — ABNORMAL HIGH (ref 1.7–7.7)
Neutrophils Relative %: 83 %
Platelets: 316 10*3/uL (ref 150–400)
RBC: 2.87 MIL/uL — ABNORMAL LOW (ref 3.87–5.11)
RDW: 17.7 % — ABNORMAL HIGH (ref 11.5–15.5)
WBC: 9.6 10*3/uL (ref 4.0–10.5)
nRBC: 0 % (ref 0.0–0.2)

## 2023-04-03 LAB — MAGNESIUM: Magnesium: 1.9 mg/dL (ref 1.7–2.4)

## 2023-04-03 LAB — PHOSPHORUS: Phosphorus: 3.4 mg/dL (ref 2.5–4.6)

## 2023-04-04 DIAGNOSIS — R6520 Severe sepsis without septic shock: Secondary | ICD-10-CM

## 2023-04-04 DIAGNOSIS — G9341 Metabolic encephalopathy: Secondary | ICD-10-CM

## 2023-04-04 DIAGNOSIS — Z930 Tracheostomy status: Secondary | ICD-10-CM

## 2023-04-04 DIAGNOSIS — J9621 Acute and chronic respiratory failure with hypoxia: Secondary | ICD-10-CM

## 2023-04-04 DIAGNOSIS — G7281 Critical illness myopathy: Secondary | ICD-10-CM

## 2023-04-05 DIAGNOSIS — R6520 Severe sepsis without septic shock: Secondary | ICD-10-CM

## 2023-04-05 DIAGNOSIS — J9621 Acute and chronic respiratory failure with hypoxia: Secondary | ICD-10-CM

## 2023-04-05 DIAGNOSIS — G9341 Metabolic encephalopathy: Secondary | ICD-10-CM

## 2023-04-05 DIAGNOSIS — Z930 Tracheostomy status: Secondary | ICD-10-CM

## 2023-04-05 DIAGNOSIS — G7281 Critical illness myopathy: Secondary | ICD-10-CM

## 2023-04-06 DIAGNOSIS — G7281 Critical illness myopathy: Secondary | ICD-10-CM

## 2023-04-06 DIAGNOSIS — R6520 Severe sepsis without septic shock: Secondary | ICD-10-CM

## 2023-04-06 DIAGNOSIS — G9341 Metabolic encephalopathy: Secondary | ICD-10-CM

## 2023-04-06 DIAGNOSIS — Z930 Tracheostomy status: Secondary | ICD-10-CM

## 2023-04-06 DIAGNOSIS — J9621 Acute and chronic respiratory failure with hypoxia: Secondary | ICD-10-CM

## 2023-04-06 LAB — BLOOD GAS, ARTERIAL
Acid-Base Excess: 6.8 mmol/L — ABNORMAL HIGH (ref 0.0–2.0)
Bicarbonate: 31.3 mmol/L — ABNORMAL HIGH (ref 20.0–28.0)
O2 Saturation: 98.8 %
Patient temperature: 37
pCO2 arterial: 43 mm[Hg] (ref 32–48)
pH, Arterial: 7.47 — ABNORMAL HIGH (ref 7.35–7.45)
pO2, Arterial: 89 mm[Hg] (ref 83–108)

## 2023-04-07 DIAGNOSIS — G9341 Metabolic encephalopathy: Secondary | ICD-10-CM

## 2023-04-07 DIAGNOSIS — J9621 Acute and chronic respiratory failure with hypoxia: Secondary | ICD-10-CM

## 2023-04-07 DIAGNOSIS — R6520 Severe sepsis without septic shock: Secondary | ICD-10-CM

## 2023-04-07 DIAGNOSIS — Z930 Tracheostomy status: Secondary | ICD-10-CM

## 2023-04-07 DIAGNOSIS — G7281 Critical illness myopathy: Secondary | ICD-10-CM

## 2023-04-07 LAB — CBC WITH DIFFERENTIAL/PLATELET
Abs Immature Granulocytes: 0.39 10*3/uL — ABNORMAL HIGH (ref 0.00–0.07)
Basophils Absolute: 0.1 10*3/uL (ref 0.0–0.1)
Basophils Relative: 1 %
Eosinophils Absolute: 0.4 10*3/uL (ref 0.0–0.5)
Eosinophils Relative: 4 %
HCT: 27.4 % — ABNORMAL LOW (ref 36.0–46.0)
Hemoglobin: 8.3 g/dL — ABNORMAL LOW (ref 12.0–15.0)
Immature Granulocytes: 4 %
Lymphocytes Relative: 14 %
Lymphs Abs: 1.5 10*3/uL (ref 0.7–4.0)
MCH: 28.4 pg (ref 26.0–34.0)
MCHC: 30.3 g/dL (ref 30.0–36.0)
MCV: 93.8 fL (ref 80.0–100.0)
Monocytes Absolute: 0.8 10*3/uL (ref 0.1–1.0)
Monocytes Relative: 7 %
Neutro Abs: 7.6 10*3/uL (ref 1.7–7.7)
Neutrophils Relative %: 70 %
Platelets: 404 10*3/uL — ABNORMAL HIGH (ref 150–400)
RBC: 2.92 MIL/uL — ABNORMAL LOW (ref 3.87–5.11)
RDW: 17.9 % — ABNORMAL HIGH (ref 11.5–15.5)
WBC: 10.7 10*3/uL — ABNORMAL HIGH (ref 4.0–10.5)
nRBC: 0.8 % — ABNORMAL HIGH (ref 0.0–0.2)

## 2023-04-07 LAB — COMPREHENSIVE METABOLIC PANEL
ALT: 12 U/L (ref 0–44)
AST: 19 U/L (ref 15–41)
Albumin: 1.6 g/dL — ABNORMAL LOW (ref 3.5–5.0)
Alkaline Phosphatase: 116 U/L (ref 38–126)
Anion gap: 6 (ref 5–15)
BUN: 17 mg/dL (ref 6–20)
CO2: 27 mmol/L (ref 22–32)
Calcium: 10.2 mg/dL (ref 8.9–10.3)
Chloride: 103 mmol/L (ref 98–111)
Creatinine, Ser: 0.47 mg/dL (ref 0.44–1.00)
GFR, Estimated: >60 mL/min (ref >60)
Glucose, Bld: 167 mg/dL — ABNORMAL HIGH (ref 70–99)
Potassium: 4.8 mmol/L (ref 3.5–5.1)
Sodium: 136 mmol/L (ref 135–145)
Total Bilirubin: 0.3 mg/dL (ref <1.2)
Total Protein: 6 g/dL — ABNORMAL LOW (ref 6.5–8.1)

## 2023-04-08 DIAGNOSIS — R6520 Severe sepsis without septic shock: Secondary | ICD-10-CM

## 2023-04-08 DIAGNOSIS — G7281 Critical illness myopathy: Secondary | ICD-10-CM

## 2023-04-08 DIAGNOSIS — J9621 Acute and chronic respiratory failure with hypoxia: Secondary | ICD-10-CM

## 2023-04-08 DIAGNOSIS — G9341 Metabolic encephalopathy: Secondary | ICD-10-CM

## 2023-04-08 DIAGNOSIS — Z930 Tracheostomy status: Secondary | ICD-10-CM

## 2023-04-09 DIAGNOSIS — R6520 Severe sepsis without septic shock: Secondary | ICD-10-CM | POA: Diagnosis not present

## 2023-04-09 DIAGNOSIS — Z930 Tracheostomy status: Secondary | ICD-10-CM | POA: Diagnosis not present

## 2023-04-09 DIAGNOSIS — J9621 Acute and chronic respiratory failure with hypoxia: Secondary | ICD-10-CM | POA: Diagnosis not present

## 2023-04-09 DIAGNOSIS — G7281 Critical illness myopathy: Secondary | ICD-10-CM | POA: Diagnosis not present

## 2023-04-09 DIAGNOSIS — G9341 Metabolic encephalopathy: Secondary | ICD-10-CM

## 2023-04-10 DIAGNOSIS — G9341 Metabolic encephalopathy: Secondary | ICD-10-CM

## 2023-04-10 DIAGNOSIS — Z930 Tracheostomy status: Secondary | ICD-10-CM | POA: Diagnosis not present

## 2023-04-10 DIAGNOSIS — G7281 Critical illness myopathy: Secondary | ICD-10-CM | POA: Diagnosis not present

## 2023-04-10 DIAGNOSIS — J9621 Acute and chronic respiratory failure with hypoxia: Secondary | ICD-10-CM | POA: Diagnosis not present

## 2023-04-10 DIAGNOSIS — R6520 Severe sepsis without septic shock: Secondary | ICD-10-CM | POA: Diagnosis not present

## 2023-04-11 DIAGNOSIS — R6520 Severe sepsis without septic shock: Secondary | ICD-10-CM | POA: Diagnosis not present

## 2023-04-11 DIAGNOSIS — Z930 Tracheostomy status: Secondary | ICD-10-CM | POA: Diagnosis not present

## 2023-04-11 DIAGNOSIS — G7281 Critical illness myopathy: Secondary | ICD-10-CM | POA: Diagnosis not present

## 2023-04-11 DIAGNOSIS — J9621 Acute and chronic respiratory failure with hypoxia: Secondary | ICD-10-CM | POA: Diagnosis not present

## 2023-04-11 DIAGNOSIS — G9341 Metabolic encephalopathy: Secondary | ICD-10-CM

## 2023-04-13 DIAGNOSIS — Z930 Tracheostomy status: Secondary | ICD-10-CM | POA: Diagnosis not present

## 2023-04-13 DIAGNOSIS — J9621 Acute and chronic respiratory failure with hypoxia: Secondary | ICD-10-CM | POA: Diagnosis not present

## 2023-04-13 DIAGNOSIS — G9341 Metabolic encephalopathy: Secondary | ICD-10-CM

## 2023-04-13 DIAGNOSIS — G7281 Critical illness myopathy: Secondary | ICD-10-CM | POA: Diagnosis not present

## 2023-04-13 DIAGNOSIS — R6520 Severe sepsis without septic shock: Secondary | ICD-10-CM | POA: Diagnosis not present

## 2023-04-13 LAB — BASIC METABOLIC PANEL
Anion gap: 8 (ref 5–15)
BUN: 19 mg/dL (ref 6–20)
CO2: 32 mmol/L (ref 22–32)
Calcium: 10.9 mg/dL — ABNORMAL HIGH (ref 8.9–10.3)
Chloride: 93 mmol/L — ABNORMAL LOW (ref 98–111)
Creatinine, Ser: 0.4 mg/dL — ABNORMAL LOW (ref 0.44–1.00)
GFR, Estimated: >60 mL/min (ref >60)
Glucose, Bld: 245 mg/dL — ABNORMAL HIGH (ref 70–99)
Potassium: 3.8 mmol/L (ref 3.5–5.1)
Sodium: 133 mmol/L — ABNORMAL LOW (ref 135–145)

## 2023-04-13 LAB — CBC
HCT: 27.5 % — ABNORMAL LOW (ref 36.0–46.0)
Hemoglobin: 8 g/dL — ABNORMAL LOW (ref 12.0–15.0)
MCH: 27.1 pg (ref 26.0–34.0)
MCHC: 29.1 g/dL — ABNORMAL LOW (ref 30.0–36.0)
MCV: 93.2 fL (ref 80.0–100.0)
Platelets: 347 10*3/uL (ref 150–400)
RBC: 2.95 MIL/uL — ABNORMAL LOW (ref 3.87–5.11)
RDW: 16 % — ABNORMAL HIGH (ref 11.5–15.5)
WBC: 10.5 10*3/uL (ref 4.0–10.5)
nRBC: 0 % (ref 0.0–0.2)

## 2023-04-14 DIAGNOSIS — R6520 Severe sepsis without septic shock: Secondary | ICD-10-CM | POA: Diagnosis not present

## 2023-04-14 DIAGNOSIS — G9341 Metabolic encephalopathy: Secondary | ICD-10-CM

## 2023-04-14 DIAGNOSIS — J9621 Acute and chronic respiratory failure with hypoxia: Secondary | ICD-10-CM | POA: Diagnosis not present

## 2023-04-14 DIAGNOSIS — G7281 Critical illness myopathy: Secondary | ICD-10-CM | POA: Diagnosis not present

## 2023-04-14 DIAGNOSIS — Z930 Tracheostomy status: Secondary | ICD-10-CM | POA: Diagnosis not present

## 2023-04-30 DIAGNOSIS — Z930 Tracheostomy status: Secondary | ICD-10-CM | POA: Diagnosis not present

## 2023-04-30 DIAGNOSIS — G7281 Critical illness myopathy: Secondary | ICD-10-CM | POA: Diagnosis not present

## 2023-04-30 DIAGNOSIS — J189 Pneumonia, unspecified organism: Secondary | ICD-10-CM | POA: Diagnosis not present

## 2023-04-30 DIAGNOSIS — I2609 Other pulmonary embolism with acute cor pulmonale: Secondary | ICD-10-CM | POA: Diagnosis not present

## 2023-04-30 DIAGNOSIS — L89159 Pressure ulcer of sacral region, unspecified stage: Secondary | ICD-10-CM | POA: Diagnosis not present

## 2023-04-30 DIAGNOSIS — J9621 Acute and chronic respiratory failure with hypoxia: Secondary | ICD-10-CM | POA: Diagnosis not present

## 2023-05-01 DIAGNOSIS — J189 Pneumonia, unspecified organism: Secondary | ICD-10-CM

## 2023-05-01 DIAGNOSIS — Z930 Tracheostomy status: Secondary | ICD-10-CM

## 2023-05-01 DIAGNOSIS — G7281 Critical illness myopathy: Secondary | ICD-10-CM

## 2023-05-01 DIAGNOSIS — J9621 Acute and chronic respiratory failure with hypoxia: Secondary | ICD-10-CM

## 2023-05-02 DIAGNOSIS — G7281 Critical illness myopathy: Secondary | ICD-10-CM

## 2023-05-02 DIAGNOSIS — Z930 Tracheostomy status: Secondary | ICD-10-CM | POA: Diagnosis not present

## 2023-05-02 DIAGNOSIS — J9621 Acute and chronic respiratory failure with hypoxia: Secondary | ICD-10-CM

## 2023-05-02 DIAGNOSIS — J189 Pneumonia, unspecified organism: Secondary | ICD-10-CM | POA: Diagnosis not present

## 2023-05-03 DIAGNOSIS — J189 Pneumonia, unspecified organism: Secondary | ICD-10-CM

## 2023-05-03 DIAGNOSIS — G7281 Critical illness myopathy: Secondary | ICD-10-CM

## 2023-05-03 DIAGNOSIS — Z930 Tracheostomy status: Secondary | ICD-10-CM

## 2023-05-03 DIAGNOSIS — J9621 Acute and chronic respiratory failure with hypoxia: Secondary | ICD-10-CM

## 2023-05-04 DIAGNOSIS — J9621 Acute and chronic respiratory failure with hypoxia: Secondary | ICD-10-CM | POA: Diagnosis not present

## 2023-05-04 DIAGNOSIS — J189 Pneumonia, unspecified organism: Secondary | ICD-10-CM | POA: Diagnosis not present

## 2023-05-04 DIAGNOSIS — G7281 Critical illness myopathy: Secondary | ICD-10-CM | POA: Diagnosis not present

## 2023-05-04 DIAGNOSIS — Z930 Tracheostomy status: Secondary | ICD-10-CM | POA: Diagnosis not present

## 2023-05-12 DIAGNOSIS — Z930 Tracheostomy status: Secondary | ICD-10-CM | POA: Diagnosis not present

## 2023-05-12 DIAGNOSIS — G7281 Critical illness myopathy: Secondary | ICD-10-CM | POA: Diagnosis not present

## 2023-05-12 DIAGNOSIS — J9621 Acute and chronic respiratory failure with hypoxia: Secondary | ICD-10-CM | POA: Diagnosis not present

## 2023-05-12 DIAGNOSIS — J189 Pneumonia, unspecified organism: Secondary | ICD-10-CM | POA: Diagnosis not present

## 2023-05-14 DIAGNOSIS — G7281 Critical illness myopathy: Secondary | ICD-10-CM | POA: Diagnosis not present

## 2023-05-14 DIAGNOSIS — J9621 Acute and chronic respiratory failure with hypoxia: Secondary | ICD-10-CM | POA: Diagnosis not present

## 2023-05-14 DIAGNOSIS — Z930 Tracheostomy status: Secondary | ICD-10-CM | POA: Diagnosis not present

## 2023-05-14 DIAGNOSIS — J189 Pneumonia, unspecified organism: Secondary | ICD-10-CM | POA: Diagnosis not present

## 2023-05-15 DIAGNOSIS — J9621 Acute and chronic respiratory failure with hypoxia: Secondary | ICD-10-CM | POA: Diagnosis not present

## 2023-05-15 DIAGNOSIS — Z930 Tracheostomy status: Secondary | ICD-10-CM | POA: Diagnosis not present

## 2023-05-15 DIAGNOSIS — J189 Pneumonia, unspecified organism: Secondary | ICD-10-CM | POA: Diagnosis not present

## 2023-05-15 DIAGNOSIS — G7281 Critical illness myopathy: Secondary | ICD-10-CM | POA: Diagnosis not present

## 2023-05-16 DIAGNOSIS — Z930 Tracheostomy status: Secondary | ICD-10-CM | POA: Diagnosis not present

## 2023-05-16 DIAGNOSIS — G7281 Critical illness myopathy: Secondary | ICD-10-CM | POA: Diagnosis not present

## 2023-05-16 DIAGNOSIS — J189 Pneumonia, unspecified organism: Secondary | ICD-10-CM | POA: Diagnosis not present

## 2023-05-16 DIAGNOSIS — J9621 Acute and chronic respiratory failure with hypoxia: Secondary | ICD-10-CM | POA: Diagnosis not present

## 2023-05-17 DIAGNOSIS — Z930 Tracheostomy status: Secondary | ICD-10-CM | POA: Diagnosis not present

## 2023-05-17 DIAGNOSIS — J189 Pneumonia, unspecified organism: Secondary | ICD-10-CM | POA: Diagnosis not present

## 2023-05-17 DIAGNOSIS — G7281 Critical illness myopathy: Secondary | ICD-10-CM | POA: Diagnosis not present

## 2023-05-17 DIAGNOSIS — L89159 Pressure ulcer of sacral region, unspecified stage: Secondary | ICD-10-CM | POA: Diagnosis not present

## 2023-05-17 DIAGNOSIS — J9621 Acute and chronic respiratory failure with hypoxia: Secondary | ICD-10-CM | POA: Diagnosis not present

## 2023-05-17 DIAGNOSIS — I2609 Other pulmonary embolism with acute cor pulmonale: Secondary | ICD-10-CM | POA: Diagnosis not present

## 2023-05-18 DIAGNOSIS — L89159 Pressure ulcer of sacral region, unspecified stage: Secondary | ICD-10-CM | POA: Diagnosis not present

## 2023-05-18 DIAGNOSIS — Z930 Tracheostomy status: Secondary | ICD-10-CM | POA: Diagnosis not present

## 2023-05-18 DIAGNOSIS — J189 Pneumonia, unspecified organism: Secondary | ICD-10-CM | POA: Diagnosis not present

## 2023-05-18 DIAGNOSIS — J9621 Acute and chronic respiratory failure with hypoxia: Secondary | ICD-10-CM | POA: Diagnosis not present

## 2023-05-18 DIAGNOSIS — G7281 Critical illness myopathy: Secondary | ICD-10-CM | POA: Diagnosis not present

## 2023-05-18 DIAGNOSIS — I2609 Other pulmonary embolism with acute cor pulmonale: Secondary | ICD-10-CM | POA: Diagnosis not present

## 2023-05-26 DIAGNOSIS — G7281 Critical illness myopathy: Secondary | ICD-10-CM | POA: Diagnosis not present

## 2023-05-26 DIAGNOSIS — J189 Pneumonia, unspecified organism: Secondary | ICD-10-CM | POA: Diagnosis not present

## 2023-05-26 DIAGNOSIS — J9621 Acute and chronic respiratory failure with hypoxia: Secondary | ICD-10-CM | POA: Diagnosis not present

## 2023-05-26 DIAGNOSIS — Z930 Tracheostomy status: Secondary | ICD-10-CM | POA: Diagnosis not present

## 2023-07-09 DIAGNOSIS — R079 Chest pain, unspecified: Secondary | ICD-10-CM | POA: Diagnosis not present

## 2023-07-12 DIAGNOSIS — L89159 Pressure ulcer of sacral region, unspecified stage: Secondary | ICD-10-CM | POA: Diagnosis not present

## 2023-07-12 DIAGNOSIS — L299 Pruritus, unspecified: Secondary | ICD-10-CM | POA: Diagnosis not present

## 2023-07-12 DIAGNOSIS — I2699 Other pulmonary embolism without acute cor pulmonale: Secondary | ICD-10-CM | POA: Diagnosis not present

## 2023-07-13 DIAGNOSIS — L89159 Pressure ulcer of sacral region, unspecified stage: Secondary | ICD-10-CM | POA: Diagnosis not present

## 2023-07-13 DIAGNOSIS — I2699 Other pulmonary embolism without acute cor pulmonale: Secondary | ICD-10-CM | POA: Diagnosis not present

## 2023-07-13 DIAGNOSIS — L299 Pruritus, unspecified: Secondary | ICD-10-CM | POA: Diagnosis not present

## 2023-10-29 ENCOUNTER — Encounter (HOSPITAL_COMMUNITY): Payer: Self-pay

## 2023-11-01 DIAGNOSIS — L89159 Pressure ulcer of sacral region, unspecified stage: Secondary | ICD-10-CM | POA: Diagnosis not present

## 2023-11-01 DIAGNOSIS — R110 Nausea: Secondary | ICD-10-CM | POA: Diagnosis not present

## 2023-11-02 DIAGNOSIS — R110 Nausea: Secondary | ICD-10-CM | POA: Diagnosis not present

## 2023-11-02 DIAGNOSIS — L89159 Pressure ulcer of sacral region, unspecified stage: Secondary | ICD-10-CM | POA: Diagnosis not present

## 2023-11-13 DIAGNOSIS — L89159 Pressure ulcer of sacral region, unspecified stage: Secondary | ICD-10-CM | POA: Diagnosis not present

## 2023-11-13 DIAGNOSIS — R110 Nausea: Secondary | ICD-10-CM | POA: Diagnosis not present

## 2023-11-14 DIAGNOSIS — R110 Nausea: Secondary | ICD-10-CM | POA: Diagnosis not present

## 2023-11-14 DIAGNOSIS — L89159 Pressure ulcer of sacral region, unspecified stage: Secondary | ICD-10-CM | POA: Diagnosis not present

## 2023-11-15 DIAGNOSIS — L89159 Pressure ulcer of sacral region, unspecified stage: Secondary | ICD-10-CM | POA: Diagnosis not present

## 2023-11-15 DIAGNOSIS — R110 Nausea: Secondary | ICD-10-CM | POA: Diagnosis not present

## 2023-11-16 DIAGNOSIS — R110 Nausea: Secondary | ICD-10-CM | POA: Diagnosis not present

## 2023-11-16 DIAGNOSIS — L89159 Pressure ulcer of sacral region, unspecified stage: Secondary | ICD-10-CM | POA: Diagnosis not present

## 2023-12-01 ENCOUNTER — Inpatient Hospital Stay
Admission: EM | Admit: 2023-12-01 | Discharge: 2023-12-11 | DRG: 871 | Disposition: A | Discharge status: Skilled Nursing Facility | Source: Skilled Nursing Facility | Attending: Internal Medicine | Admitting: Internal Medicine

## 2023-12-01 ENCOUNTER — Emergency Department

## 2023-12-01 ENCOUNTER — Other Ambulatory Visit: Payer: Self-pay

## 2023-12-01 DIAGNOSIS — L89154 Pressure ulcer of sacral region, stage 4: Secondary | ICD-10-CM | POA: Diagnosis present

## 2023-12-01 DIAGNOSIS — Z6833 Body mass index (BMI) 33.0-33.9, adult: Secondary | ICD-10-CM

## 2023-12-01 DIAGNOSIS — I10 Essential (primary) hypertension: Secondary | ICD-10-CM | POA: Diagnosis present

## 2023-12-01 DIAGNOSIS — A419 Sepsis, unspecified organism: Secondary | ICD-10-CM | POA: Diagnosis present

## 2023-12-01 DIAGNOSIS — R5381 Other malaise: Secondary | ICD-10-CM | POA: Diagnosis present

## 2023-12-01 DIAGNOSIS — Z7401 Bed confinement status: Secondary | ICD-10-CM

## 2023-12-01 DIAGNOSIS — E8809 Other disorders of plasma-protein metabolism, not elsewhere classified: Secondary | ICD-10-CM | POA: Diagnosis present

## 2023-12-01 DIAGNOSIS — Z79899 Other long term (current) drug therapy: Secondary | ICD-10-CM

## 2023-12-01 DIAGNOSIS — M4628 Osteomyelitis of vertebra, sacral and sacrococcygeal region: Secondary | ICD-10-CM | POA: Diagnosis present

## 2023-12-01 DIAGNOSIS — E876 Hypokalemia: Secondary | ICD-10-CM | POA: Diagnosis present

## 2023-12-01 DIAGNOSIS — Z79890 Hormone replacement therapy (postmenopausal): Secondary | ICD-10-CM

## 2023-12-01 DIAGNOSIS — Z8249 Family history of ischemic heart disease and other diseases of the circulatory system: Secondary | ICD-10-CM

## 2023-12-01 DIAGNOSIS — Z978 Presence of other specified devices: Secondary | ICD-10-CM

## 2023-12-01 DIAGNOSIS — Z87891 Personal history of nicotine dependence: Secondary | ICD-10-CM

## 2023-12-01 DIAGNOSIS — E1042 Type 1 diabetes mellitus with diabetic polyneuropathy: Secondary | ICD-10-CM

## 2023-12-01 DIAGNOSIS — Y828 Other medical devices associated with adverse incidents: Secondary | ICD-10-CM | POA: Diagnosis present

## 2023-12-01 DIAGNOSIS — N3000 Acute cystitis without hematuria: Principal | ICD-10-CM | POA: Diagnosis present

## 2023-12-01 DIAGNOSIS — T83511A Infection and inflammatory reaction due to indwelling urethral catheter, initial encounter: Secondary | ICD-10-CM | POA: Diagnosis present

## 2023-12-01 DIAGNOSIS — E1169 Type 2 diabetes mellitus with other specified complication: Secondary | ICD-10-CM | POA: Diagnosis present

## 2023-12-01 DIAGNOSIS — R234 Changes in skin texture: Secondary | ICD-10-CM | POA: Diagnosis present

## 2023-12-01 DIAGNOSIS — I2510 Atherosclerotic heart disease of native coronary artery without angina pectoris: Secondary | ICD-10-CM | POA: Diagnosis present

## 2023-12-01 DIAGNOSIS — Z794 Long term (current) use of insulin: Secondary | ICD-10-CM

## 2023-12-01 DIAGNOSIS — N390 Urinary tract infection, site not specified: Secondary | ICD-10-CM | POA: Diagnosis not present

## 2023-12-01 DIAGNOSIS — F419 Anxiety disorder, unspecified: Secondary | ICD-10-CM | POA: Diagnosis present

## 2023-12-01 DIAGNOSIS — E1142 Type 2 diabetes mellitus with diabetic polyneuropathy: Secondary | ICD-10-CM | POA: Diagnosis present

## 2023-12-01 DIAGNOSIS — F32A Depression, unspecified: Secondary | ICD-10-CM | POA: Diagnosis present

## 2023-12-01 DIAGNOSIS — R627 Adult failure to thrive: Secondary | ICD-10-CM | POA: Diagnosis present

## 2023-12-01 DIAGNOSIS — Z951 Presence of aortocoronary bypass graft: Secondary | ICD-10-CM

## 2023-12-01 DIAGNOSIS — E785 Hyperlipidemia, unspecified: Secondary | ICD-10-CM | POA: Diagnosis present

## 2023-12-01 DIAGNOSIS — I9589 Other hypotension: Secondary | ICD-10-CM | POA: Diagnosis present

## 2023-12-01 DIAGNOSIS — J9811 Atelectasis: Secondary | ICD-10-CM | POA: Diagnosis present

## 2023-12-01 DIAGNOSIS — B9620 Unspecified Escherichia coli [E. coli] as the cause of diseases classified elsewhere: Secondary | ICD-10-CM | POA: Diagnosis present

## 2023-12-01 DIAGNOSIS — E66811 Obesity, class 1: Secondary | ICD-10-CM | POA: Diagnosis present

## 2023-12-01 DIAGNOSIS — I700 Atherosclerosis of aorta: Secondary | ICD-10-CM | POA: Diagnosis present

## 2023-12-01 DIAGNOSIS — D509 Iron deficiency anemia, unspecified: Secondary | ICD-10-CM | POA: Diagnosis present

## 2023-12-01 DIAGNOSIS — Z7984 Long term (current) use of oral hypoglycemic drugs: Secondary | ICD-10-CM

## 2023-12-01 DIAGNOSIS — Z833 Family history of diabetes mellitus: Secondary | ICD-10-CM

## 2023-12-01 DIAGNOSIS — N3289 Other specified disorders of bladder: Secondary | ICD-10-CM | POA: Diagnosis not present

## 2023-12-01 DIAGNOSIS — F411 Generalized anxiety disorder: Secondary | ICD-10-CM

## 2023-12-01 DIAGNOSIS — F439 Reaction to severe stress, unspecified: Secondary | ICD-10-CM

## 2023-12-01 DIAGNOSIS — J690 Pneumonitis due to inhalation of food and vomit: Secondary | ICD-10-CM | POA: Diagnosis present

## 2023-12-01 DIAGNOSIS — E039 Hypothyroidism, unspecified: Secondary | ICD-10-CM | POA: Diagnosis present

## 2023-12-01 DIAGNOSIS — N200 Calculus of kidney: Secondary | ICD-10-CM | POA: Diagnosis present

## 2023-12-01 DIAGNOSIS — F418 Other specified anxiety disorders: Secondary | ICD-10-CM | POA: Diagnosis present

## 2023-12-01 DIAGNOSIS — G43909 Migraine, unspecified, not intractable, without status migrainosus: Secondary | ICD-10-CM | POA: Diagnosis present

## 2023-12-01 DIAGNOSIS — Z7985 Long-term (current) use of injectable non-insulin antidiabetic drugs: Secondary | ICD-10-CM

## 2023-12-01 DIAGNOSIS — Z8349 Family history of other endocrine, nutritional and metabolic diseases: Secondary | ICD-10-CM

## 2023-12-01 HISTORY — DX: Morbid (severe) obesity due to excess calories: E66.01

## 2023-12-01 HISTORY — DX: Bed confinement status: Z74.01

## 2023-12-01 HISTORY — DX: Memory deficit following cerebral infarction: I69.311

## 2023-12-01 HISTORY — DX: Atherosclerotic heart disease of native coronary artery without angina pectoris: I25.10

## 2023-12-01 HISTORY — DX: Hyperlipidemia, unspecified: E78.5

## 2023-12-01 HISTORY — DX: Acute embolism and thrombosis of unspecified deep veins of unspecified lower extremity: I82.409

## 2023-12-01 HISTORY — DX: Osteomyelitis of vertebra, sacral and sacrococcygeal region: M46.28

## 2023-12-01 HISTORY — DX: Essential (primary) hypertension: I10

## 2023-12-01 HISTORY — DX: Cerebral infarction, unspecified: I63.9

## 2023-12-01 HISTORY — DX: Other pulmonary embolism without acute cor pulmonale: I26.99

## 2023-12-01 HISTORY — DX: Type 2 diabetes mellitus without complications: E11.9

## 2023-12-01 HISTORY — DX: Type 2 diabetes mellitus with diabetic polyneuropathy: E11.42

## 2023-12-01 HISTORY — DX: Gastrostomy status: Z93.1

## 2023-12-01 HISTORY — DX: Pressure ulcer of sacral region, unspecified stage: L89.159

## 2023-12-01 HISTORY — DX: Type 2 diabetes mellitus with proliferative diabetic retinopathy with macular edema, unspecified eye: E11.3519

## 2023-12-01 LAB — URINALYSIS, W/ REFLEX TO CULTURE (INFECTION SUSPECTED)
Bilirubin Urine: NEGATIVE
Glucose, UA: NEGATIVE mg/dL
Hgb urine dipstick: NEGATIVE
Ketones, ur: 80 mg/dL — AB
Nitrite: NEGATIVE
Protein, ur: 30 mg/dL — AB
Specific Gravity, Urine: 1.016 (ref 1.005–1.030)
Squamous Epithelial / HPF: 0 /HPF (ref 0–5)
WBC, UA: >50 WBC/hpf (ref 0–5)
pH: 6 (ref 5.0–8.0)

## 2023-12-01 LAB — CBC WITH DIFFERENTIAL/PLATELET
Abs Immature Granulocytes: 0.05 K/uL (ref 0.00–0.07)
Basophils Absolute: 0 K/uL (ref 0.0–0.1)
Basophils Relative: 0 %
Eosinophils Absolute: 0.1 K/uL (ref 0.0–0.5)
Eosinophils Relative: 1 %
HCT: 40.1 % (ref 36.0–46.0)
Hemoglobin: 12.3 g/dL (ref 12.0–15.0)
Immature Granulocytes: 1 %
Lymphocytes Relative: 11 %
Lymphs Abs: 1.1 K/uL (ref 0.7–4.0)
MCH: 28.3 pg (ref 26.0–34.0)
MCHC: 30.7 g/dL (ref 30.0–36.0)
MCV: 92.2 fL (ref 80.0–100.0)
Monocytes Absolute: 0.5 K/uL (ref 0.1–1.0)
Monocytes Relative: 5 %
Neutro Abs: 7.8 K/uL — ABNORMAL HIGH (ref 1.7–7.7)
Neutrophils Relative %: 82 %
Platelets: 432 K/uL — ABNORMAL HIGH (ref 150–400)
RBC: 4.35 MIL/uL (ref 3.87–5.11)
RDW: 13.6 % (ref 11.5–15.5)
WBC: 9.6 K/uL (ref 4.0–10.5)
nRBC: 0 % (ref 0.0–0.2)

## 2023-12-01 LAB — PHOSPHORUS: Phosphorus: 1.7 mg/dL — ABNORMAL LOW (ref 2.5–4.6)

## 2023-12-01 LAB — COMPREHENSIVE METABOLIC PANEL WITH GFR
ALT: 14 U/L (ref 0–44)
AST: 23 U/L (ref 15–41)
Albumin: 2.1 g/dL — ABNORMAL LOW (ref 3.5–5.0)
Alkaline Phosphatase: 118 U/L (ref 38–126)
Anion gap: 19 — ABNORMAL HIGH (ref 5–15)
BUN: 12 mg/dL (ref 6–20)
CO2: 23 mmol/L (ref 22–32)
Calcium: 11.1 mg/dL — ABNORMAL HIGH (ref 8.9–10.3)
Chloride: 99 mmol/L (ref 98–111)
Creatinine, Ser: 0.5 mg/dL (ref 0.44–1.00)
GFR, Estimated: >60 mL/min (ref >60)
Glucose, Bld: 132 mg/dL — ABNORMAL HIGH (ref 70–99)
Potassium: 3.4 mmol/L — ABNORMAL LOW (ref 3.5–5.1)
Sodium: 141 mmol/L (ref 135–145)
Total Bilirubin: 1.3 mg/dL — ABNORMAL HIGH (ref 0.0–1.2)
Total Protein: 6.1 g/dL — ABNORMAL LOW (ref 6.5–8.1)

## 2023-12-01 LAB — MAGNESIUM: Magnesium: 2.2 mg/dL (ref 1.7–2.4)

## 2023-12-01 LAB — SEDIMENTATION RATE: Sed Rate: 43 mm/h — ABNORMAL HIGH (ref 0–30)

## 2023-12-01 LAB — LACTIC ACID, PLASMA: Lactic Acid, Venous: 0.9 mmol/L (ref 0.5–1.9)

## 2023-12-01 MED ORDER — ACETAMINOPHEN 325 MG PO TABS
650.0000 mg | ORAL_TABLET | Freq: Four times a day (QID) | ORAL | Status: DC | PRN
  Administered 2023-12-01: 650 mg via ORAL
  Filled 2023-12-01: qty 2

## 2023-12-01 MED ORDER — INSULIN GLARGINE-YFGN 100 UNIT/ML ~~LOC~~ SOLN
8.0000 [IU] | Freq: Every day | SUBCUTANEOUS | Status: DC
  Filled 2023-12-01: qty 0.08

## 2023-12-01 MED ORDER — HYDRALAZINE HCL 20 MG/ML IJ SOLN: 5.0000 mg | INTRAMUSCULAR | Status: DC | PRN

## 2023-12-01 MED ORDER — POTASSIUM CHLORIDE CRYS ER 20 MEQ PO TBCR: 20.0000 meq | EXTENDED_RELEASE_TABLET | Freq: Once | ORAL | Status: DC

## 2023-12-01 MED ORDER — SERTRALINE HCL 50 MG PO TABS: 100.0000 mg | ORAL_TABLET | Freq: Every day | ORAL | Status: DC

## 2023-12-01 MED ORDER — IOHEXOL 300 MG/ML  SOLN
100.0000 mL | Freq: Once | INTRAMUSCULAR | Status: AC | PRN
  Administered 2023-12-01: 100 mL via INTRAVENOUS

## 2023-12-01 MED ORDER — HEPARIN SODIUM (PORCINE) 5000 UNIT/ML IJ SOLN
5000.0000 [IU] | Freq: Three times a day (TID) | INTRAMUSCULAR | Status: DC
  Administered 2023-12-01 – 2023-12-04 (×8): 5000 [IU] via SUBCUTANEOUS
  Filled 2023-12-01 (×8): qty 1

## 2023-12-01 MED ORDER — SODIUM CHLORIDE 0.9 % IV SOLN
2.0000 g | Freq: Once | INTRAVENOUS | Status: AC
  Administered 2023-12-01: 2 g via INTRAVENOUS
  Filled 2023-12-01: qty 20

## 2023-12-01 MED ORDER — LORAZEPAM 1 MG PO TABS
1.0000 mg | ORAL_TABLET | Freq: Two times a day (BID) | ORAL | Status: DC
  Filled 2023-12-01: qty 1

## 2023-12-01 MED ORDER — OXYCODONE-ACETAMINOPHEN 5-325 MG PO TABS
1.0000 | ORAL_TABLET | Freq: Once | ORAL | Status: AC
  Administered 2023-12-01: 1 via ORAL
  Filled 2023-12-01: qty 1

## 2023-12-01 MED ORDER — ONDANSETRON HCL 4 MG/2ML IJ SOLN
4.0000 mg | Freq: Three times a day (TID) | INTRAMUSCULAR | Status: DC | PRN
  Administered 2023-12-09: 4 mg via INTRAVENOUS
  Filled 2023-12-01 (×2): qty 2

## 2023-12-01 MED ORDER — POTASSIUM CHLORIDE 20 MEQ PO PACK
20.0000 meq | PACK | Freq: Once | ORAL | Status: AC
  Administered 2023-12-01: 20 meq
  Filled 2023-12-01: qty 1

## 2023-12-01 MED ORDER — LEVOTHYROXINE SODIUM 50 MCG PO TABS: 50.0000 ug | ORAL_TABLET | Freq: Every day | ORAL | Status: DC

## 2023-12-01 MED ORDER — SODIUM CHLORIDE 0.9 % IV BOLUS
1000.0000 mL | Freq: Once | INTRAVENOUS | Status: AC
  Administered 2023-12-01: 1000 mL via INTRAVENOUS

## 2023-12-01 MED ORDER — MORPHINE SULFATE (PF) 4 MG/ML IV SOLN
4.0000 mg | Freq: Once | INTRAVENOUS | Status: AC
  Administered 2023-12-01: 4 mg via INTRAVENOUS
  Filled 2023-12-01: qty 1

## 2023-12-01 MED ORDER — SODIUM CHLORIDE 0.9 % IV SOLN
1.0000 g | Freq: Three times a day (TID) | INTRAVENOUS | Status: DC
  Administered 2023-12-01 – 2023-12-04 (×8): 1 g via INTRAVENOUS
  Filled 2023-12-01 (×10): qty 20

## 2023-12-01 MED ORDER — GABAPENTIN 300 MG PO CAPS
900.0000 mg | ORAL_CAPSULE | Freq: Three times a day (TID) | ORAL | Status: DC
  Filled 2023-12-01: qty 3

## 2023-12-01 MED ORDER — VANCOMYCIN HCL 1750 MG/350ML IV SOLN
1750.0000 mg | Freq: Once | INTRAVENOUS | Status: AC
  Administered 2023-12-01: 1750 mg via INTRAVENOUS
  Filled 2023-12-01 (×2): qty 350

## 2023-12-01 MED ORDER — VANCOMYCIN HCL 750 MG/150ML IV SOLN
750.0000 mg | Freq: Two times a day (BID) | INTRAVENOUS | Status: DC
  Administered 2023-12-02 – 2023-12-04 (×5): 750 mg via INTRAVENOUS
  Filled 2023-12-01 (×5): qty 150

## 2023-12-01 NOTE — ED Triage Notes (Signed)
 First nurse note: Pt here via [facility] from [facility]. Pt here for wound on coccyx area, pt has a chronic foley and thinks she has a UTI. UA done at facility and shows nitrites in urine, pt has chronic foley.   HR:85 100% RA 125/68 97.8  CBG 120

## 2023-12-01 NOTE — ED Notes (Signed)
 Transport paged to transport pt to assigned in-patient unit room

## 2023-12-01 NOTE — ED Provider Notes (Signed)
 [facility] Provider Note    Event Date/Time   First MD Initiated Contact with Patient 12/01/23 1549     (approximate)   History   Wound Check   HPI  Ruth Mayo is a 63 year old female with history of complex recent medical history including CABG in 2024 complicated by wound dehiscence, development of large sacral wound resulting in bacteremia and osteomyelitis presenting to the emergency department for evaluation of weakness.  Accompanied by husband who provides collateral history.  They both report that over the past week while patient has been at [facility] she has been weaker than her recent baseline.  Patient also reports mild confusion.  They note that this has occurred in the past when she has some sort of infection developing.  She did have a urinalysis sent from her existing Foley catheter at her facility that was concerning for infection.  They are unsure when the catheter was last changed, but was first placed a few months ago.  I reviewed ER visit in the [facility] system from 10/05/2023.  At that time patient presented with somnolence, had workup reassuring aside from elevated TSH for which her levothyroxine  was adjusted and she was discharged.      Physical Exam   Triage Vital Signs: ED Triage Vitals  Encounter Vitals Group     BP 12/01/23 1434 (!) 103/57     Girls Systolic BP Percentile --      Girls Diastolic BP Percentile --      Boys Systolic BP Percentile --      Boys Diastolic BP Percentile --      Pulse Rate 12/01/23 1434 87     Resp 12/01/23 1434 17     Temp 12/01/23 1434 98.3 F (36.8 C)     Temp src --      SpO2 12/01/23 1434 100 %     Weight 12/01/23 1435 177 lb 0.5 oz (80.3 kg)     Height 12/01/23 1435 5' 1 (1.549 m)     Head Circumference --      Peak Flow --      Pain Score 12/01/23 1435 9     Pain Loc --      Pain Education --      Exclude from Growth Chart --     Most recent vital signs: Vitals:   12/01/23  1830 12/01/23 1900  BP:    Pulse: 94   Resp: 17   Temp:  97.9 F (36.6 C)  SpO2: 98%      General: Awake, interactive  CV:  Regular rate, good peripheral perfusion.  Resp:  Unlabored respirations.  Abd:  Nondistended, soft, mild generalized tenderness to palpation that patient reports is new, G-tube in place without surrounding erythema or drainage, large sacral wound noted, see image below, no obvious surrounding erythema, drainage Neuro:  No gross facial asymmetry, generalized weakness     ED Results / Procedures / Treatments   Labs (all labs ordered are listed, but only abnormal results are displayed) Labs Reviewed  CBC WITH DIFFERENTIAL/PLATELET - Abnormal; Notable for the following components:      Result Value   Platelets 432 (*)    Neutro Abs 7.8 (*)    All other components within normal limits  COMPREHENSIVE METABOLIC PANEL WITH GFR - Abnormal; Notable for the following components:   Potassium 3.4 (*)    Glucose, Bld 132 (*)    Calcium  11.1 (*)    Total Protein 6.1 (*)  Albumin 2.1 (*)    Total Bilirubin 1.3 (*)    Anion gap 19 (*)    All other components within normal limits  URINALYSIS, W/ REFLEX TO CULTURE (INFECTION SUSPECTED) - Abnormal; Notable for the following components:   Color, Urine YELLOW (*)    APPearance CLOUDY (*)    Ketones, ur 80 (*)    Protein, ur 30 (*)    Leukocytes,Ua LARGE (*)    Bacteria, UA RARE (*)    All other components within normal limits  URINE CULTURE  CULTURE, BLOOD (ROUTINE X 2)  CULTURE, BLOOD (ROUTINE X 2)  LACTIC ACID, PLASMA  MAGNESIUM  PHOSPHORUS  C-REACTIVE PROTEIN  SEDIMENTATION RATE  HIV ANTIBODY (ROUTINE TESTING W REFLEX)  PROTIME-INR  APTT  BASIC METABOLIC PANEL WITH GFR  CBC     EKG EKG independently reviewed and interpreted by myself demonstrates:  EKG demonstrates sinus tachycardia at a rate of 104, PR 146, cures 103, QTc 494, nonspecific ST changes  RADIOLOGY Imaging independently reviewed and  interpreted by myself demonstrates:  CT abdomen pelvis demonstrates bladder wall thickening concerning for cystitis, radiology also notes increased size of sacral decubitus ulcer with possible area of small abscess  Formal Radiology Read:  CT ABDOMEN PELVIS W CONTRAST Result Date: 12/01/2023 CLINICAL DATA:  Abdominal pain, acute, nonlocalized weakness, abd pain, hx sacral osteomyelitis EXAM: CT ABDOMEN AND PELVIS WITH CONTRAST TECHNIQUE: Multidetector CT imaging of the abdomen and pelvis was performed using the standard protocol following bolus administration of intravenous contrast. RADIATION DOSE REDUCTION: This exam was performed according to the departmental dose-optimization program which includes automated exposure control, adjustment of the mA and/or kV according to patient size and/or use of iterative reconstruction technique. CONTRAST:  OMNIPAQUE  IOHEXOL  300 MG/ML  SOLN COMPARISON:  October 17, 2023 FINDINGS: Lower chest: Dependent airspace opacities in both lung bases, more nodular on the left, worrisome for aspiration. No pleural effusion. Multi-vessel coronary atherosclerosis. Sternotomy wires. Hepatobiliary: No mass.No radiopaque stones or wall thickening of the gallbladder. No intrahepatic or extrahepatic biliary ductal dilation. The portal veins are patent. Pancreas: Diffuse fatty atrophy of the pancreatic parenchyma. No mass or ductal dilation. no peripancreatic inflammation or fluid collection. Spleen: Normal size. No mass. Adrenals/Urinary Tract: No adrenal masses. No renal mass. Multiple small nonobstructive bilateral calyceal calculi. No hydronephrosis. The urinary bladder is completely decompressed with a urinary catheter in place. Gas in the bladder lumen is likely related to catheterization. Circumferential wall thickening of the urinary bladder. Stomach/Bowel: The stomach contains ingested material without focal abnormality. Percutaneous gastrostomy tube well-positioned in the gastric  antrum. No small bowel wall thickening or inflammation. No small bowel obstruction.Normal appendix. Vascular/Lymphatic: No aortic aneurysm. Diffuse aortoiliac atherosclerosis. No intraabdominal or pelvic lymphadenopathy. Reproductive: Age-related atrophy of the uterus and ovaries. No concerning adnexal mass.No free pelvic fluid. Other: No pneumoperitoneum, ascites, or mesenteric inflammation. Musculoskeletal: No acute fracture. Absence of the lower sacrum has progressed in the interim. Redemonstrated large sacral decubitus ulcer, which is 5 cm deep, contacting the sacrococcygeal junction. Increased craniocaudal extent measuring 11.5 cm with undermining of the subcutaneous fat superiorly. Along the right lateral aspect within the medial right gluteal musculature, there is a small peripherally enhancing gas and fluid collection measuring 1.7 x 1.6 x 1.8 cm. IMPRESSION: 1. Circumferential wall thickening of the bladder may be due to underdistention or chronic inflammation from the indwelling urinary catheter. If there is concern for acute cystitis, laboratory correlation would be recommended. 2. Increasing size and depth of the sacral  decubitus ulcer, measuring 5 cm deep and extending 11.5 cm in the craniocaudal extent. Progressive bony destruction of the lower sacrum, consistent with osteomyelitis. Along the right lateral aspect in the medial right gluteal musculature, there is a small peripherally enhancing gas and fluid collection measuring 1.7 x 1.6 x 1.8 cm. This could reflect an intramuscular abscess or gas and fluid trapped within a lateral extension of the decubitus ulcer. 3. Multiple small nonobstructive bilateral renal calculi. No hydronephrosis. 4. Dependent airspace opacities in both lungs, likely atelectasis on the right. On the left, there is more pronounced nodularity, which is worrisome for aspiration. Aortic Atherosclerosis (ICD10-I70.0). Electronically Signed   By: Rogelia Myers M.D.   On:  12/01/2023 19:57    PROCEDURES:  Critical Care performed: No  Procedures   MEDICATIONS ORDERED IN ED: Medications  cefTRIAXone  (ROCEPHIN ) 2 g in sodium chloride  0.9 % 100 mL IVPB (2 g Intravenous New Bag/Given 12/01/23 2048)  ondansetron  (ZOFRAN ) injection 4 mg (has no administration in time range)  hydrALAZINE  (APRESOLINE ) injection 5 mg (has no administration in time range)  acetaminophen  (TYLENOL ) tablet 650 mg (has no administration in time range)  potassium chloride  SA (KLOR-CON  M) CR tablet 20 mEq (has no administration in time range)  heparin  injection 5,000 Units (has no administration in time range)  oxyCODONE -acetaminophen  (PERCOCET/ROXICET) 5-325 MG per tablet 1 tablet (1 tablet Oral Given 12/01/23 1803)  iohexol  (OMNIPAQUE ) 300 MG/ML solution 100 mL (100 mLs Intravenous Contrast Given 12/01/23 1844)  morphine  (PF) 4 MG/ML injection 4 mg (4 mg Intravenous Given 12/01/23 2044)  sodium chloride  0.9 % bolus 1,000 mL (1,000 mLs Intravenous New Bag/Given 12/01/23 2048)     IMPRESSION / MDM / ASSESSMENT AND PLAN / ED COURSE  I reviewed the triage vital signs and the nursing notes.  Differential diagnosis includes, but is not limited to, UTI, acute on chronic infection of sacral wound, anemia, electrolyte abnormality, debility  Patient's presentation is most consistent with acute presentation with potential threat to life or bodily function.  63 year old female presenting to the emergency department for evaluation of weakness.  Stable vitals on presentation.  Labs with overall reassuring CBC, CMP.  Normal lactate.  Given concerns for UTI, will exchange patient's Foley here and then send urinalysis from new catheter.  Also reports generalized abdominal pain, sacral wound not obviously infected, will obtain CT to further evaluate.  CT demonstrates thickened bladder wall.  Urine here is concerning for infection, also notes ketones, suspect likely dehydration. Normal glucose.  CT also  notes enlarged size of her sacral ulcer compared to June with possible area of small associated abscess.  Patient updated on results of workup.  Suspect that her weakness is likely related to a UTI.  She does have a chronic sacral wound, and clear if she has an acute component of infection.  Does not meet SIRS criteria.  Do think admission for further management is reasonable.  Will reach out to hospitalist team.  Case discussed with Dr. Hilma. He will evaluate for anticipated admission.      FINAL CLINICAL IMPRESSION(S) / ED DIAGNOSES   Final diagnoses:  Acute cystitis without hematuria  Sacral osteomyelitis (HCC)     Rx / DC Orders   ED Discharge Orders     None        Note:  This document was prepared using Dragon voice recognition software and may include unintentional dictation errors.   Levander Slate, MD 12/01/23 2107

## 2023-12-01 NOTE — ED Triage Notes (Signed)
 See first nose note

## 2023-12-01 NOTE — Consult Note (Addendum)
 Pharmacy Antibiotic Note  Ruth Mayo is a 63 y.o. female admitted on 12/01/2023 with weakness.  Pharmacy has been consulted for Vancomycin  and Meropenem  dosing.  Plan: Vancomycin  1750mg  IV x 1 as loading dose, followed by: Vancomycin  750 mg IV Q 12 hrs. Goal AUC 400-550. Expected AUC: 506.4 Expected Cmin: 16.1 SCr used: 0.8(actual 0.5), Vd used: 0.72   Height: 5' 1 (154.9 cm) Weight: 80.3 kg (177 lb 0.5 oz) IBW/kg (Calculated) : 47.8  Temp (24hrs), Avg:98.1 F (36.7 C), Min:97.9 F (36.6 C), Max:98.3 F (36.8 C)  Recent Labs  Lab 12/01/23 1445  WBC 9.6  CREATININE 0.50  LATICACIDVEN 0.9    Estimated Creatinine Clearance: 71.8 mL/min (by C-G formula based on SCr of 0.5 mg/dL).    No Known Allergies  Antimicrobials this admission: Vancomycin  7/28 >>  Meropenem  7/28 >>   Dose adjustments this admission: N/A  Microbiology results: 7/28 BCx: ordered 7/28 UCx: collected    Thank you for allowing pharmacy to be a part of this patient's care.  Nashia Remus A Gaines Cartmell 12/01/2023 9:17 PM

## 2023-12-01 NOTE — H&P (Incomplete)
 History and Physical    Ruth Mayo FMW:[id number] DOB: [birth year] DOA: 12/01/2023  Referring MD/NP/PA:   PCP: Allen Lauraine CROME, PA-C   Patient coming from:  The patient is coming from home.     Chief Complaint:   HPI: Ruth Mayo is a 63 y.o. female with medical history significant of      Data reviewed independently and ED Course: pt was found to have     ***       EKG: I have personally reviewed.  Not done in ED, will get one.   ***   Review of Systems:   General: no fevers, chills, no body weight gain, has poor appetite, has fatigue HEENT: no blurry vision, hearing changes or sore throat Respiratory: no dyspnea, coughing, wheezing CV: no chest pain, no palpitations GI: no nausea, vomiting, abdominal pain, diarrhea, constipation GU: no dysuria, burning on urination, increased urinary frequency, hematuria  Ext: no leg edema Neuro: no unilateral weakness, numbness, or tingling, no vision change or hearing loss Skin: no rash, no skin tear. MSK: No muscle spasm, no deformity, no limitation of range of movement in spin Heme: No easy bruising.  Travel history: No recent long distant travel.   Allergy: No Known Allergies  Past Medical History:  Diagnosis Date   Anxiety    Arthritis    Depression    Diabetes mellitus    History of iron deficiency anemia    Hypothyroidism    Migraines     Past Surgical History:  Procedure Laterality Date   CESAREAN SECTION     IR REMOVAL TUN CV CATH W/O FL  03/20/2023   TUBAL LIGATION      Social History:  reports that she quit smoking about 20 years ago. Her smoking use included cigarettes. She started smoking about 45 years ago. She has a 25 pack-year smoking history. She has never used smokeless tobacco. She reports current alcohol use. She reports that she does not use drugs.  Family History:  Family History  Problem Relation Age of Onset   Diabetes Mother    Diabetes Sister    Hypertension Sister    Thyroid  disease  Sister    Diabetes Maternal Grandmother      Prior to Admission medications   Medication Sig Start Date End Date Taking? Authorizing Provider  clotrimazole -betamethasone  (LOTRISONE ) cream Apply 1 application topically 2 (two) times daily. 01/02/18   Weber, Lauraine CROME, PA-C  Dulaglutide  (TRULICITY ) 0.75 MG/0.5ML SOPN Inject 0.75 mg into the skin once a week. 06/20/17   Weber, Lauraine CROME, PA-C  gabapentin  (NEURONTIN ) 300 MG capsule Take 3 capsules (900 mg total) by mouth 3 (three) times daily. 01/02/18   Weber, Lauraine CROME, PA-C  Insulin  Glargine (LANTUS  SOLOSTAR) 100 UNIT/ML Solostar Pen Inject 15 Units into the skin daily. 05/08/18   [provider]  levothyroxine  (SYNTHROID , LEVOTHROID) 50 MCG tablet Take 1 tablet (50 mcg total) by mouth daily. 03/13/18   Melonie Colonel, Mikel HERO, MD  lisinopril  (PRINIVIL ,ZESTRIL ) 5 MG tablet Take 1 tablet (5 mg total) by mouth daily. 01/02/18   Weber, Lauraine CROME, PA-C  LORazepam  (ATIVAN ) 1 MG tablet TAKE ONE TABLET BY MOUTH TWICE A DAY FOR ANXIETY 01/02/18   Weber, Sarah L, PA-C  metFORMIN  (GLUCOPHAGE ) 1000 MG tablet TAKE ONE TABLET BY MOUTH TWICE A DAY WITH A MEAL 01/02/18   Weber, Sarah L, PA-C  rosuvastatin  (CRESTOR ) 10 MG tablet Take 1 tablet (10 mg total) by mouth daily. 06/20/17   Allen, Lauraine CROME,  PA-C  sertraline  (ZOLOFT ) 100 MG tablet Take 100 mg by mouth daily.    [provider]    Physical Exam: Vitals:   12/01/23 1800 12/01/23 1815 12/01/23 1830 12/01/23 1900  BP: (!) 102/56     Pulse: 78 95 94   Resp: 14 16 17    Temp:    97.9 F (36.6 C)  TempSrc:    Oral  SpO2: 99% 99% 98%   Weight:      Height:       General: Not in acute distress HEENT:       Eyes: PERRL, EOMI, no jaundice       ENT: No discharge from the ears and nose, no pharynx injection, no tonsillar enlargement.        Neck: No JVD, no bruit, no mass felt. Heme: No neck lymph node enlargement. Cardiac: S1/S2, RRR, No murmurs, No gallops or rubs. Respiratory: No rales, wheezing,  rhonchi or rubs. GI: Soft, nondistended, nontender, no rebound pain, no organomegaly, BS present. GU: No hematuria Ext: No pitting leg edema bilaterally. 1+DP/PT pulse bilaterally. Musculoskeletal: No joint deformities, No joint redness or warmth, no limitation of ROM in spin. Skin: No rashes.  Neuro: Alert, oriented X3, cranial nerves II-XII grossly intact, moves all extremities normally. Muscle strength 5/5 in all extremities, sensation to light touch intact. Brachial reflex 2+ bilaterally. Knee reflex 1+ bilaterally. Negative Babinski's sign. Normal finger to nose test. Psych: Patient is not psychotic, no suicidal or hemocidal ideation.  Labs on Admission: I have personally reviewed following labs and imaging studies  CBC: Recent Labs  Lab 12/01/23 1445  WBC 9.6  NEUTROABS 7.8*  HGB 12.3  HCT 40.1  MCV 92.2  PLT 432*   Basic Metabolic Panel: Recent Labs  Lab 12/01/23 1445  NA 141  K 3.4*  CL 99  CO2 23  GLUCOSE 132*  BUN 12  CREATININE 0.50  CALCIUM  11.1*   GFR: Estimated Creatinine Clearance: 71.8 mL/min (by C-G formula based on SCr of 0.5 mg/dL). Liver Function Tests: Recent Labs  Lab 12/01/23 1445  AST 23  ALT 14  ALKPHOS 118  BILITOT 1.3*  PROT 6.1*  ALBUMIN 2.1*   No results for input(s): LIPASE, AMYLASE in the last 168 hours. No results for input(s): AMMONIA in the last 168 hours. Coagulation Profile: No results for input(s): INR, PROTIME in the last 168 hours. Cardiac Enzymes: No results for input(s): CKTOTAL, CKMB, CKMBINDEX, TROPONINI in the last 168 hours. BNP (last 3 results) No results for input(s): PROBNP in the last 8760 hours. HbA1C: No results for input(s): HGBA1C in the last 72 hours. CBG: No results for input(s): GLUCAP in the last 168 hours. Lipid Profile: No results for input(s): CHOL, HDL, LDLCALC, TRIG, CHOLHDL, LDLDIRECT in the last 72 hours. Thyroid  Function Tests: No results for input(s):  TSH, T4TOTAL, FREET4, T3FREE, THYROIDAB in the last 72 hours. Anemia Panel: No results for input(s): VITAMINB12, FOLATE, FERRITIN, TIBC, IRON, RETICCTPCT in the last 72 hours. Urine analysis:    Component Value Date/Time   COLORURINE YELLOW (A) 12/01/2023 1911   APPEARANCEUR CLOUDY (A) 12/01/2023 1911   LABSPEC 1.016 12/01/2023 1911   PHURINE 6.0 12/01/2023 1911   GLUCOSEU NEGATIVE 12/01/2023 1911   HGBUR NEGATIVE 12/01/2023 1911   BILIRUBINUR NEGATIVE 12/01/2023 1911   KETONESUR 80 (A) 12/01/2023 1911   PROTEINUR 30 (A) 12/01/2023 1911   NITRITE NEGATIVE 12/01/2023 1911   LEUKOCYTESUR LARGE (A) 12/01/2023 1911   Sepsis Labs: @LABRCNTIP (procalcitonin:4,lacticidven:4) )No results found  for this or any previous visit (from the past 240 hours).   Radiological Exams on Admission:   Assessment/Plan Active Problems:   * No active hospital problems. *   Assessment and Plan: No notes have been filed under this hospital service. Service: Hospitalist      Active Problems:   * No active hospital problems. *    DVT ppx: SQ Heparin          SQ Lovenox  Code Status: Full code   ***  Family Communication:     not done, no family member is at bed side.              Yes, patient's    at bed side.       by phone   ***  Disposition Plan:  Anticipate discharge back to previous environment  Consults called:    Admission status and Level of care: :    for obs as inpt        Dispo: The patient is from: {From:23814}              Anticipated d/c is to: {To:23815}              Anticipated d/c date is: {Days:23816}              Patient currently {Medically stable:23817}    Severity of Illness:  {Observation/Inpatient:21159}       Date of Service 12/01/2023    Caleb Exon [facility]   If 7PM-7AM, please contact night-coverage [email or web address] 12/01/2023, 8:50 PM

## 2023-12-02 DIAGNOSIS — L89154 Pressure ulcer of sacral region, stage 4: Secondary | ICD-10-CM | POA: Diagnosis not present

## 2023-12-02 DIAGNOSIS — M4628 Osteomyelitis of vertebra, sacral and sacrococcygeal region: Secondary | ICD-10-CM | POA: Diagnosis not present

## 2023-12-02 LAB — CBC
HCT: 33.3 % — ABNORMAL LOW (ref 36.0–46.0)
Hemoglobin: 10.4 g/dL — ABNORMAL LOW (ref 12.0–15.0)
MCH: 28.8 pg (ref 26.0–34.0)
MCHC: 31.2 g/dL (ref 30.0–36.0)
MCV: 92.2 fL (ref 80.0–100.0)
Platelets: 375 K/uL (ref 150–400)
RBC: 3.61 MIL/uL — ABNORMAL LOW (ref 3.87–5.11)
RDW: 13.6 % (ref 11.5–15.5)
WBC: 7.9 K/uL (ref 4.0–10.5)
nRBC: 0 % (ref 0.0–0.2)

## 2023-12-02 LAB — GLUCOSE, CAPILLARY
Glucose-Capillary: 107 mg/dL — ABNORMAL HIGH (ref 70–99)
Glucose-Capillary: 100 mg/dL — ABNORMAL HIGH (ref 70–99)
Glucose-Capillary: 77 mg/dL (ref 70–99)
Glucose-Capillary: 108 mg/dL — ABNORMAL HIGH (ref 70–99)

## 2023-12-02 LAB — C-REACTIVE PROTEIN: CRP: 8.5 mg/dL — ABNORMAL HIGH (ref <1.0)

## 2023-12-02 LAB — TSH: TSH: 3.529 u[IU]/mL (ref 0.350–4.500)

## 2023-12-02 LAB — PROTIME-INR
INR: 1.6 — ABNORMAL HIGH (ref 0.8–1.2)
Prothrombin Time: 20 s — ABNORMAL HIGH (ref 11.4–15.2)

## 2023-12-02 LAB — APTT: aPTT: 44 s — ABNORMAL HIGH (ref 24–36)

## 2023-12-02 LAB — BASIC METABOLIC PANEL WITH GFR
Anion gap: 14 (ref 5–15)
BUN: 10 mg/dL (ref 6–20)
CO2: 24 mmol/L (ref 22–32)
Calcium: 10.2 mg/dL (ref 8.9–10.3)
Chloride: 104 mmol/L (ref 98–111)
Creatinine, Ser: 0.32 mg/dL — ABNORMAL LOW (ref 0.44–1.00)
GFR, Estimated: >60 mL/min (ref >60)
Glucose, Bld: 70 mg/dL (ref 70–99)
Potassium: 3.3 mmol/L — ABNORMAL LOW (ref 3.5–5.1)
Sodium: 142 mmol/L (ref 135–145)

## 2023-12-02 LAB — HIV ANTIBODY (ROUTINE TESTING W REFLEX): HIV Screen 4th Generation wRfx: NONREACTIVE

## 2023-12-02 MED ORDER — ACETAMINOPHEN 325 MG PO TABS
650.0000 mg | ORAL_TABLET | Freq: Four times a day (QID) | ORAL | Status: DC | PRN
  Administered 2023-12-05 – 2023-12-09 (×8): 650 mg
  Filled 2023-12-02 (×8): qty 2

## 2023-12-02 MED ORDER — LEVOTHYROXINE SODIUM 50 MCG PO TABS
50.0000 ug | ORAL_TABLET | Freq: Every day | ORAL | Status: DC
  Administered 2023-12-02 – 2023-12-04 (×3): 50 ug
  Filled 2023-12-02 (×3): qty 1

## 2023-12-02 MED ORDER — OXYCODONE-ACETAMINOPHEN 5-325 MG PO TABS
1.0000 | ORAL_TABLET | ORAL | Status: DC | PRN
  Administered 2023-12-03 – 2023-12-08 (×15): 1
  Filled 2023-12-02 (×15): qty 1

## 2023-12-02 MED ORDER — INSULIN ASPART 100 UNIT/ML IJ SOLN
0.0000 [IU] | Freq: Three times a day (TID) | INTRAMUSCULAR | Status: DC
  Administered 2023-12-04 – 2023-12-08 (×2): 1 [IU] via SUBCUTANEOUS
  Administered 2023-12-09: 2 [IU] via SUBCUTANEOUS
  Administered 2023-12-10 – 2023-12-11 (×2): 1 [IU] via SUBCUTANEOUS
  Filled 2023-12-02 (×5): qty 1

## 2023-12-02 MED ORDER — SERTRALINE HCL 50 MG PO TABS
100.0000 mg | ORAL_TABLET | Freq: Every day | ORAL | Status: DC
  Administered 2023-12-02 – 2023-12-10 (×9): 100 mg
  Filled 2023-12-02 (×10): qty 2

## 2023-12-02 MED ORDER — OXYCODONE-ACETAMINOPHEN 5-325 MG PO TABS: 1.0000 | ORAL_TABLET | Freq: Four times a day (QID) | ORAL | Status: DC | PRN

## 2023-12-02 MED ORDER — INSULIN ASPART 100 UNIT/ML IJ SOLN: 0.0000 [IU] | Freq: Every day | INTRAMUSCULAR | Status: DC

## 2023-12-02 MED ORDER — POTASSIUM PHOSPHATES 15 MMOLE/5ML IV SOLN
30.0000 mmol | Freq: Once | INTRAVENOUS | Status: AC
  Administered 2023-12-02: 30 mmol via INTRAVENOUS
  Filled 2023-12-02: qty 10

## 2023-12-02 MED ORDER — LORAZEPAM 1 MG PO TABS
1.0000 mg | ORAL_TABLET | Freq: Two times a day (BID) | ORAL | Status: DC
  Administered 2023-12-02 – 2023-12-11 (×19): 1 mg
  Filled 2023-12-02 (×19): qty 1

## 2023-12-02 MED ORDER — CHLORHEXIDINE GLUCONATE CLOTH 2 % EX PADS
6.0000 | MEDICATED_PAD | Freq: Every day | CUTANEOUS | Status: DC
  Administered 2023-12-03 – 2023-12-11 (×9): 6 via TOPICAL

## 2023-12-02 MED ORDER — MORPHINE SULFATE (PF) 2 MG/ML IV SOLN
2.0000 mg | INTRAVENOUS | Status: DC | PRN
  Administered 2023-12-02 – 2023-12-04 (×3): 2 mg via INTRAVENOUS
  Filled 2023-12-02 (×3): qty 1

## 2023-12-02 MED ORDER — GABAPENTIN 300 MG PO CAPS
900.0000 mg | ORAL_CAPSULE | Freq: Three times a day (TID) | ORAL | Status: DC
  Administered 2023-12-02 – 2023-12-11 (×28): 900 mg
  Filled 2023-12-02 (×29): qty 3

## 2023-12-02 NOTE — Hospital Course (Addendum)
 Ruth Mayo is a 63 y.o. 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   07/28: to ED. Concern for worsening sacral ulcer w/ osteomyelitis (see A/P for CT report). Admitted for sepsis d/t this as well as UTI, CT also concerning for possible aspiration pneumonia/pneumonitis.  07/29: per general surgery does not need debridement. ID advised no to antibiotics but wound vac Continue daily dressing 7/31: Patient had low BP and night on-call was called. BP is soft but no symptoms. Urine our put is good. 8/1: Medically ready for discharge to skilled 8/2: Waiting for case manager to arrange discharge to nursing home

## 2023-12-02 NOTE — Consult Note (Signed)
 Patient ID: Ruth Mayo, female   DOB: 1960/09/24, 63 y.o.   MRN: [id number]  HPI Ruth Mayo is a 63 y.o. female seen in consultation at the request of Dr Marsa d/w her in detail. Medical history significant of CAD, s/p of CABG, bedbound, chronic indwelling Foley catheter, hypertension, hyperlipidemia, diabetes mellitus, hypothyroidism, depression with anxiety, obesity, anemia, chronic close anemia, s/p feeding tube placement (only use it for taking medications patient), sacral wound with osteomyelitis, who presents with weakness and failure to thrive. Decubitus present for months.   Pt states that she had CABG surgery in September 2024 [facility] which was complicated by wound dehiscence, development of large sacral wound resulting in bacteremia and osteomyelitis. Pt states that she has been feeling weak in the past several days.  SHe is able to Wigle toes and move legs w/o resistance but unable to walk. She does have some intermittent sacral pain, moderate, sharp, no specific alleviating or aggravating factors.   She denies chest pain, cough, SOB.  She has nausea, no vomiting, diarrhea or abdominal pain.  No fever or chills.  She has a large sacral wound with surrounding erythema.  Her indwelling Foley catheter was exchanged in ED.  CT scan pers reviewed w sacral ulcer w bony destruction, no evidence of necrotizing infectiion Data reviewed independently and ED Course: pt was found to have WBC 9.6, potassium 3.4, was 1.7, magnesium 2.2, calcium  11.1, positive UA (cloudy appearance, large amount of leukocyte, rare bacteria, WBC> 50), lactic acid 0.9.      HPI  Past Medical History:  Diagnosis Date   Anxiety    Arthritis    CAD (coronary artery disease)    Depression    Diabetes mellitus    History of iron deficiency anemia    HLD (hyperlipidemia)    HTN (hypertension)    Hypothyroidism    Migraines     Past Surgical History:  Procedure Laterality Date   CESAREAN  SECTION     CORONARY ARTERY BYPASS GRAFT     IR REMOVAL TUN CV CATH W/O FL  03/20/2023   TUBAL LIGATION      Family History  Problem Relation Age of Onset   Diabetes Mother    Diabetes Sister    Hypertension Sister    Thyroid  disease Sister    Diabetes Maternal Grandmother     Social History Social History   Tobacco Use   Smoking status: Former    Current packs/day: 0.00    Average packs/day: 1 pack/day for 25.0 years (25.0 ttl pk-yrs)    Types: Cigarettes    Start date: 06/06/1978    Quit date: 06/07/2003    Years since quitting: 20.5   Smokeless tobacco: Never  Vaping Use   Vaping status: Never Used  Substance Use Topics   Alcohol use: Yes    Comment: a couple glasses wine a week   Drug use: No    No Known Allergies  Current Facility-Administered Medications  Medication Dose Route Frequency Provider Last Rate Last Admin   acetaminophen  (TYLENOL ) tablet 650 mg  650 mg Per Tube Q6H PRN Niu, Xilin, MD       gabapentin  (NEURONTIN ) capsule 900 mg  900 mg Per Tube TID Niu, Xilin, MD   900 mg at 12/02/23 0927   heparin  injection 5,000 Units  5,000 Units Subcutaneous Q8H Niu, Xilin, MD   5,000 Units at 12/02/23 9490   hydrALAZINE  (APRESOLINE ) injection 5 mg  5 mg Intravenous Q2H PRN Niu, Xilin, MD  insulin  aspart (novoLOG ) injection 0-5 Units  0-5 Units Subcutaneous QHS Niu, Xilin, MD       insulin  aspart (novoLOG ) injection 0-9 Units  0-9 Units Subcutaneous TID WC Niu, Xilin, MD       levothyroxine  (SYNTHROID ) tablet 50 mcg  50 mcg Per Tube Q0600 Niu, Xilin, MD   50 mcg at 12/02/23 0509   LORazepam  (ATIVAN ) tablet 1 mg  1 mg Per Tube BID Niu, Xilin, MD   1 mg at 12/02/23 9071   meropenem  (MERREM ) 1 g in sodium chloride  0.9 % 100 mL IVPB  1 g Intravenous Q8H Nazari, Walid A, RPH 200 mL/hr at 12/02/23 0511 1 g at 12/02/23 0511   morphine  (PF) 2 MG/ML injection 2 mg  2 mg Intravenous Q2H PRN Alexander, Natalie, DO       ondansetron  (ZOFRAN ) injection 4 mg  4 mg Intravenous  Q8H PRN Niu, Xilin, MD       oxyCODONE -acetaminophen  (PERCOCET/ROXICET) 5-325 MG per tablet 1 tablet  1 tablet Per Tube Q4H PRN Alexander, Natalie, DO       sertraline  (ZOLOFT ) tablet 100 mg  100 mg Per Tube Daily Niu, Xilin, MD   100 mg at 12/02/23 9071   vancomycin  (VANCOREADY) IVPB 750 mg/150 mL  750 mg Intravenous Q12H Nazari, Walid A, RPH 150 mL/hr at 12/02/23 0927 750 mg at 12/02/23 9072     Review of Systems Full ROS  was asked and was negative except for the information on the HPI  Physical Exam Blood pressure 115/64, pulse 82, temperature 97.7 F (36.5 C), temperature source Oral, resp. rate 17, height 5' 1 (1.549 m), weight 80.3 kg, SpO2 100%. CONSTITUTIONAL: chronically ill.Chaperone present  EYES: Pupils are equal, round, Sclera are non-icteric. EARS, NOSE, MOUTH AND THROAT: The oropharynx is clear. The oral mucosa is pink and moist. Hearing is intact to voice. LYMPH NODES:  Lymph nodes in the neck are normal. RESPIRATORY:  Lungs are clear. There is normal respiratory effort, with equal breath sounds bilaterally, and without pathologic use of accessory muscles. CARDIOVASCULAR: Heart is regular without murmurs, gallops, or rubs. GI: The abdomen is  soft, nontender, and nondistended. There are no palpable masses. There is no hepatosplenomegaly. There are normal bowel sounds, Gastrostomy tube in place  GU: Rectal deferred.   MUSCULOSKELETAL: Normal muscle strength and tone. No cyanosis or edema.   SKIN: sacral decubitus ulcer stage IV, good beefy granulation tissues w/o abscess or un drained collection, no evidence of necrotizing infection. 10 x 9 cms NEUROLOGIC: Motor and sensation is grossly normal. Cranial nerves are grossly intact.Wiggles toes and moves upper extremities w/o focal siogns PSYCH:  Oriented to person, place and time. Affect is normal.  Data Reviewed I have personally reviewed the patient's imaging, laboratory findings and medical records.        Assessment/Plan 63 year old with multiple medical issues including recent CABG, diabetes, failure to thrive and bedbound with large sacral decubitus stage IV.  No need for debridement or surgical intervention at this time.  Continue improvement and optimization of nutrition, mobility and pressure-relief measures. May continue daily dressing changes We will be available I personally spent a total of 75 minutes in the care of the patient today including performing a medically appropriate exam/evaluation, counseling and educating, placing orders, referring and communicating with other health care professionals, documenting clinical information in the EHR, independently interpreting and reviewing images studies and coordinating care.    Laneta Luna, MD FACS General Surgeon 12/02/2023, 11:27 AM

## 2023-12-02 NOTE — Consult Note (Addendum)
 WOC Nurse Consult Note: Reason for Consult: Requested to assess a sacrum wound. Wound type: PI stage 4 on sacrum. Note: Sacral wound osteomyelitis  Pressure Injury POA: Yes Measurement: see flowsheet. Wound bed: 80% red, 20% yellow. Drainage (amount, consistency, odor) Moderate amount, serous. Periwound: intact. Dressing procedure/placement/frequency: Cleanse with Vashe [id number], not rinse, pat dry the periwound skin. Apply Aquacel [id number] and cover with foam dressing, change daily or PRN.  - Turn the patient per protocol. - If she is able to sit, use relieve pressure pillow at the chair.  WOC team will not plan to follow further. Please reconsult if further assistance is needed. Thank-you,  Lela Holm BSN, CNS, RN, [employer], WOCN  (Phone [phone])

## 2023-12-02 NOTE — Progress Notes (Signed)
 PROGRESS NOTE    Ruth Mayo   FMW:[id number] DOB: [birth year]  DOA: 12/01/2023 Date of Service: 12/02/23 which is hospital day 1  PCP: Allen Lauraine CROME, [facility] course / significant events:   HPI: Ruth Mayo is a 63 y.o. 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   07/28: to ED. Concern for worsening sacral ulcer w/ osteomyelitis (see A/P for CT report). Admitted for sepsis d/t this as well as UTI, CT also concerning for possible aspiration pneumonia/pneumonitis.  07/29: per general surgery does not need debridement. Consult to ID pending.   Per discharge summary 07/01/23 [facility] Sacral decubitus ulcer with sacral osteomyelitis  Hx of osteomyelitis shown on MRI pelvis 12/12 with posterior decubitus ulcer extending to bone and acute osteomyelitis of coccyx. No abscess visualized. Patient underwent wound debridement with general surgery on 06/23/2023. Per ID she was treated initially with piperacillin-tazobactam and vancomycin . Culture data grew Acinetobacter baumanii with intermediate sensitivity to piperacillin-tazobactam so switched to meropenem  to complete 6 weeks total (end = 08/07/2023). Had tunneled line successfully placed by VIR on 07/01/2023. On day of discharge, wound vacs on sacral and sternal wounds were removed but still producing substantial output.   Per [facility] ED note 10/05/23 Also evaluated for drowsiness, TSH >20 and increased synthroid , at that time discharge rx did not include abx. No imaging of sacrum at that time    Consultants:  General surgery   Procedures/Surgeries: none      ASSESSMENT & PLAN:   Sacral osteomyelitis  CT showed enlarged sacral wound with osteomyelitis Clinically not septic. Elevated Sed rate 43, elevated CRP 8.5 Increasing size and depth of the sacral decubitus ulcer, measuring 5 cm deep and extending 11.5 cm in the craniocaudal extent. Progressive bony destruction of the lower sacrum, consistent with osteomyelitis. Along the right lateral aspect in the medial right gluteal musculature, there is a small peripherally enhancing gas and fluid collection measuring 1.7 x 1.6 x 1.8 cm. This could reflect an intramuscular abscess or gas and fluid trapped within a lateral extension of the decubitus ulcer. Empiric antimicrobial treatment with vancomycin  and meropenem  (patient received 1 dose of Rocephin  in ED) Pain control Blood cultures x 2  ESR and CRP wound care consult General surgery consult - no need for debridement  Infectious disease consult - will need abx likely long term    Complicated UTI (urinary tract infection) Presence of indwelling Foley catheter Multiple small nonobstructive bilateral renal calculi. No Hydronephrosis  Foley catheter was changed in ED on meropenem  f/u urine culture Consider urology follow up    CAD (coronary artery disease) HTN HLD s/p CABG. No CP On statin, , ACE,    Hypothyroidism Recent TSH 10/05/23 was >20,  Hypothyroid could certainly contribute to altered mental status Pending TSH here Synthroid    Type 2 diabetes mellitus with peripheral neuropathy (HCC) Glargine insulin  80 units daily SSI   Hypercalcemia is chronic issue.  Calcium  11.1 on admission. Check intact, PTH related protein, 1,25-hydroxy vitamin D   Hypokalemia and Hypophosphatemia: Potassium 3.4, phosphorus 1.7, magnesium normal 2.2 Repleted potassium and phosphorus Monitor labs    Depression with anxiety Continue home Zoloft   Class 1 obesity based  on BMI: Body mass index is 33.45 kg/m.SABRA Significantly low or high BMI is associated with higher medical risk.  Underweight - under 18  overweight - 25 to 29 obese - 30 or more Class 1 obesity: BMI of 30.0 to 34 Class 2 obesity: BMI of 35.0 to 39 Class 3 obesity: BMI of 40.0 to 49 Super Morbid Obesity: BMI 50-59 Super-super Morbid Obesity: BMI 60+ Healthy nutrition and physical activity advised as adjunct to other disease management and risk reduction treatments    DVT prophylaxis: heparin  IV fluids: holding continuous IV fluids for now, encourage po intake Nutrition: cardiac/carb diet  Central lines / other devices: Foley was exchanged 07/28 in ED  Code Status: FULL CODE  ACP documentation reviewed: none on file in [facility]  TOC needs: TBD, pt at long term care likely will go back there Medical barriers to dispo: IV abx, await cultures, ID consult. Expected medical readiness for discharge several days.              Subjective / Brief ROS:  Patient awakens to voice and tracks me with her eyes, she won't answer questions, when I ask her to squeeze my hand to see if following commands she says your hands are cold, no    Family Communication: call to husband today 12/02/23 3:30 PM     Objective Findings:  Vitals:   12/01/23 1900 12/01/23 2204 12/02/23 0348 12/02/23 0730  BP:  (!) 118/50 103/64 115/64  Pulse:  83 85 82  Resp:  16 16 17   Temp: 97.9 F (36.6 C) 98.6 F (37 C) 98 F (36.7 C) 97.7 F (36.5 C)  TempSrc: Oral   Oral  SpO2:  100% 100% 100%  Weight:      Height:        Intake/Output Summary (Last 24 hours) at 12/02/2023 1525 Last data filed at 12/02/2023 1208 Gross per 24 hour  Intake 871.11 ml  Output --  Net 871.11 ml   Filed Weights   12/01/23 1435  Weight: 80.3 kg    Examination:  Physical Exam Constitutional:      General: She is not in acute distress. Cardiovascular:     Rate and Rhythm: Normal rate and regular rhythm.  Pulmonary:      Effort: Pulmonary effort is normal.     Breath sounds: Normal breath sounds.  Abdominal:     Palpations: Abdomen is soft.  Musculoskeletal:     Right lower leg: No edema.     Left lower leg: No edema.  Neurological:     Mental Status: She is alert.          Scheduled Medications:   gabapentin   900 mg Per Tube TID   heparin   5,000 Units Subcutaneous Q8H   insulin  aspart  0-5 Units Subcutaneous QHS   insulin  aspart  0-9 Units Subcutaneous TID WC   levothyroxine   50 mcg Per Tube Q0600   LORazepam   1 mg Per Tube BID   sertraline   100 mg Per Tube Daily    Continuous Infusions:  meropenem  (MERREM ) IV 1 g (12/02/23 1359)   vancomycin  750 mg (12/02/23 0927)    PRN Medications:  acetaminophen , hydrALAZINE , morphine  injection, ondansetron  (ZOFRAN ) IV, oxyCODONE -acetaminophen   Antimicrobials from admission:  Anti-infectives (From admission, onward)    Start     Dose/Rate Route Frequency Ordered Stop   12/02/23 1000  vancomycin  (VANCOREADY) IVPB 750 mg/150 mL        750 mg 150  mL/hr over 60 Minutes Intravenous Every 12 hours 12/01/23 2159     12/01/23 2200  meropenem  (MERREM ) 1 g in sodium chloride  0.9 % 100 mL IVPB        1 g 200 mL/hr over 30 Minutes Intravenous Every 8 hours 12/01/23 2115     12/01/23 2130  vancomycin  (VANCOREADY) IVPB 1750 mg/350 mL        1,750 mg 175 mL/hr over 120 Minutes Intravenous  Once 12/01/23 2117 12/02/23 0149   12/01/23 2045  cefTRIAXone  (ROCEPHIN ) 2 g in sodium chloride  0.9 % 100 mL IVPB        2 g 200 mL/hr over 30 Minutes Intravenous Once 12/01/23 2033 12/01/23 2227           Data Reviewed:  I have personally reviewed the following...  CBC: Recent Labs  Lab 12/01/23 1445 12/02/23 0231  WBC 9.6 7.9  NEUTROABS 7.8*  --   HGB 12.3 10.4*  HCT 40.1 33.3*  MCV 92.2 92.2  PLT 432* 375   Basic Metabolic Panel: Recent Labs  Lab 12/01/23 1445 12/02/23 0231  NA 141 142  K 3.4* 3.3*  CL 99 104  CO2 23 24  GLUCOSE 132*  70  BUN 12 10  CREATININE 0.50 0.32*  CALCIUM  11.1* 10.2  MG 2.2  --   PHOS 1.7*  --    GFR: Estimated Creatinine Clearance: 71.8 mL/min (A) (by C-G formula based on SCr of 0.32 mg/dL (L)). Liver Function Tests: Recent Labs  Lab 12/01/23 1445  AST 23  ALT 14  ALKPHOS 118  BILITOT 1.3*  PROT 6.1*  ALBUMIN 2.1*   No results for input(s): LIPASE, AMYLASE in the last 168 hours. No results for input(s): AMMONIA in the last 168 hours. Coagulation Profile: Recent Labs  Lab 12/02/23 0231  INR 1.6*   Cardiac Enzymes: No results for input(s): CKTOTAL, CKMB, CKMBINDEX, TROPONINI in the last 168 hours. BNP (last 3 results) No results for input(s): PROBNP in the last 8760 hours. HbA1C: No results for input(s): HGBA1C in the last 72 hours. CBG: Recent Labs  Lab 12/02/23 0727 12/02/23 1148  GLUCAP 107* 100*   Lipid Profile: No results for input(s): CHOL, HDL, LDLCALC, TRIG, CHOLHDL, LDLDIRECT in the last 72 hours. Thyroid  Function Tests: No results for input(s): TSH, T4TOTAL, FREET4, T3FREE, THYROIDAB in the last 72 hours. Anemia Panel: No results for input(s): VITAMINB12, FOLATE, FERRITIN, TIBC, IRON, RETICCTPCT in the last 72 hours. Most Recent Urinalysis On File:     Component Value Date/Time   COLORURINE YELLOW (A) 12/01/2023 1911   APPEARANCEUR CLOUDY (A) 12/01/2023 1911   LABSPEC 1.016 12/01/2023 1911   PHURINE 6.0 12/01/2023 1911   GLUCOSEU NEGATIVE 12/01/2023 1911   HGBUR NEGATIVE 12/01/2023 1911   BILIRUBINUR NEGATIVE 12/01/2023 1911   KETONESUR 80 (A) 12/01/2023 1911   PROTEINUR 30 (A) 12/01/2023 1911   NITRITE NEGATIVE 12/01/2023 1911   LEUKOCYTESUR LARGE (A) 12/01/2023 1911   Sepsis Labs: @LABRCNTIP (procalcitonin:4,lacticidven:4) Microbiology: Recent Results (from the past 240 hours)  Culture, blood (Routine X 2) w Reflex to ID Panel     Status: None (Preliminary result)   Collection Time: 12/02/23   2:31 AM   Specimen: BLOOD RIGHT ARM  Result Value Ref Range Status   Specimen Description BLOOD RIGHT ARM  Final   Special Requests   Final    BOTTLES DRAWN AEROBIC AND ANAEROBIC Blood Culture adequate volume   Culture   Final    NO GROWTH < 12 HOURS Performed  at [facility], [address], [place], [place] [address]    Report Status PENDING  Incomplete  Culture, blood (Routine X 2) w Reflex to ID Panel     Status: None (Preliminary result)   Collection Time: 12/02/23  2:39 AM   Specimen: BLOOD LEFT HAND  Result Value Ref Range Status   Specimen Description BLOOD LEFT HAND  Final   Special Requests   Final    BOTTLES DRAWN AEROBIC AND ANAEROBIC Blood Culture adequate volume   Culture   Final    NO GROWTH < 12 HOURS Performed at , [address], [place], [place] [address]    Report Status PENDING  Incomplete      Radiology Studies last 3 days: CT ABDOMEN PELVIS W CONTRAST Result Date: 12/01/2023 CLINICAL DATA:  Abdominal pain, acute, nonlocalized weakness, abd pain, hx sacral osteomyelitis EXAM: CT ABDOMEN AND PELVIS WITH CONTRAST TECHNIQUE: Multidetector CT imaging of the abdomen and pelvis was performed using the standard protocol following bolus administration of intravenous contrast. RADIATION DOSE REDUCTION: This exam was performed according to the departmental dose-optimization program which includes automated exposure control, adjustment of the mA and/or kV according to patient size and/or use of iterative reconstruction technique. CONTRAST:  OMNIPAQUE  IOHEXOL  300 MG/ML  SOLN COMPARISON:  October 17, 2023 FINDINGS: Lower chest: Dependent airspace opacities in both lung bases, more nodular on the left, worrisome for aspiration. No pleural effusion. Multi-vessel coronary atherosclerosis. Sternotomy wires. Hepatobiliary: No mass.No radiopaque stones or wall thickening of the gallbladder. No intrahepatic or extrahepatic biliary ductal dilation. The  portal veins are patent. Pancreas: Diffuse fatty atrophy of the pancreatic parenchyma. No mass or ductal dilation. no peripancreatic inflammation or fluid collection. Spleen: Normal size. No mass. Adrenals/Urinary Tract: No adrenal masses. No renal mass. Multiple small nonobstructive bilateral calyceal calculi. No hydronephrosis. The urinary bladder is completely decompressed with a urinary catheter in place. Gas in the bladder lumen is likely related to catheterization. Circumferential wall thickening of the urinary bladder. Stomach/Bowel: The stomach contains ingested material without focal abnormality. Percutaneous gastrostomy tube well-positioned in the gastric antrum. No small bowel wall thickening or inflammation. No small bowel obstruction.Normal appendix. Vascular/Lymphatic: No aortic aneurysm. Diffuse aortoiliac atherosclerosis. No intraabdominal or pelvic lymphadenopathy. Reproductive: Age-related atrophy of the uterus and ovaries. No concerning adnexal mass.No free pelvic fluid. Other: No pneumoperitoneum, ascites, or mesenteric inflammation. Musculoskeletal: No acute fracture. Absence of the lower sacrum has progressed in the interim. Redemonstrated large sacral decubitus ulcer, which is 5 cm deep, contacting the sacrococcygeal junction. Increased craniocaudal extent measuring 11.5 cm with undermining of the subcutaneous fat superiorly. Along the right lateral aspect within the medial right gluteal musculature, there is a small peripherally enhancing gas and fluid collection measuring 1.7 x 1.6 x 1.8 cm. IMPRESSION: 1. Circumferential wall thickening of the bladder may be due to underdistention or chronic inflammation from the indwelling urinary catheter. If there is concern for acute cystitis, laboratory correlation would be recommended. 2. Increasing size and depth of the sacral decubitus ulcer, measuring 5 cm deep and extending 11.5 cm in the craniocaudal extent. Progressive bony destruction of the  lower sacrum, consistent with osteomyelitis. Along the right lateral aspect in the medial right gluteal musculature, there is a small peripherally enhancing gas and fluid collection measuring 1.7 x 1.6 x 1.8 cm. This could reflect an intramuscular abscess or gas and fluid trapped within a lateral extension of the decubitus ulcer. 3. Multiple small nonobstructive bilateral renal calculi. No hydronephrosis.  4. Dependent airspace opacities in both lungs, likely atelectasis on the right. On the left, there is more pronounced nodularity, which is worrisome for aspiration. Aortic Atherosclerosis (ICD10-I70.0). Electronically Signed   By: Rogelia Myers M.D.   On: 12/01/2023 19:57       Time spent: 50 min     Laneta Blunt, DO [facility] 12/02/2023, 3:25 PM    Dictation software may have been used to generate the above note. Typos may occur and escape review in typed/dictated notes. Please contact Dr Blunt directly for clarity if needed.  Staff may message me via secure chat in Epic  but this may not receive an immediate response,  please page me for urgent matters!  If 7PM-7AM, please contact night coverage [email or web address]

## 2023-12-02 NOTE — Plan of Care (Signed)
  Problem: Education: Goal: Knowledge of General Education information will improve Description: Including pain rating scale, medication(s)/side effects and non-pharmacologic comfort measures Outcome: Progressing   Problem: Pain Managment: Goal: General experience of comfort will improve and/or be controlled Outcome: Progressing   Problem: Safety: Goal: Ability to remain free from injury will improve Outcome: Progressing   Problem: Skin Integrity: Goal: Risk for impaired skin integrity will decrease Outcome: Progressing   Problem: Skin Integrity: Goal: Risk for impaired skin integrity will decrease Outcome: Progressing   Problem: Tissue Perfusion: Goal: Adequacy of tissue perfusion will improve Outcome: Progressing

## 2023-12-02 NOTE — Plan of Care (Signed)

## 2023-12-03 DIAGNOSIS — M4628 Osteomyelitis of vertebra, sacral and sacrococcygeal region: Secondary | ICD-10-CM | POA: Diagnosis not present

## 2023-12-03 DIAGNOSIS — N390 Urinary tract infection, site not specified: Secondary | ICD-10-CM | POA: Diagnosis not present

## 2023-12-03 DIAGNOSIS — N3289 Other specified disorders of bladder: Secondary | ICD-10-CM

## 2023-12-03 DIAGNOSIS — B9620 Unspecified Escherichia coli [E. coli] as the cause of diseases classified elsewhere: Secondary | ICD-10-CM | POA: Diagnosis not present

## 2023-12-03 LAB — URINE CULTURE: Culture: >=100000 — AB

## 2023-12-03 LAB — COMPREHENSIVE METABOLIC PANEL WITH GFR
ALT: 16 U/L (ref 0–44)
AST: 31 U/L (ref 15–41)
Albumin: 1.6 g/dL — ABNORMAL LOW (ref 3.5–5.0)
Alkaline Phosphatase: 95 U/L (ref 38–126)
Anion gap: 4 — ABNORMAL LOW (ref 5–15)
BUN: 8 mg/dL (ref 6–20)
CO2: 26 mmol/L (ref 22–32)
Calcium: 9.2 mg/dL (ref 8.9–10.3)
Chloride: 107 mmol/L (ref 98–111)
Creatinine, Ser: <0.3 mg/dL — ABNORMAL LOW (ref 0.44–1.00)
Glucose, Bld: 97 mg/dL (ref 70–99)
Potassium: 3.8 mmol/L (ref 3.5–5.1)
Sodium: 137 mmol/L (ref 135–145)
Total Bilirubin: 0.7 mg/dL (ref 0.0–1.2)
Total Protein: 5.1 g/dL — ABNORMAL LOW (ref 6.5–8.1)

## 2023-12-03 LAB — CBC
HCT: 30.7 % — ABNORMAL LOW (ref 36.0–46.0)
Hemoglobin: 9.6 g/dL — ABNORMAL LOW (ref 12.0–15.0)
MCH: 28.6 pg (ref 26.0–34.0)
MCHC: 31.3 g/dL (ref 30.0–36.0)
MCV: 91.4 fL (ref 80.0–100.0)
Platelets: 321 K/uL (ref 150–400)
RBC: 3.36 MIL/uL — ABNORMAL LOW (ref 3.87–5.11)
RDW: 13.9 % (ref 11.5–15.5)
WBC: 6.3 K/uL (ref 4.0–10.5)
nRBC: 0 % (ref 0.0–0.2)

## 2023-12-03 LAB — CALCITRIOL (1,25 DI-OH VIT D): Vit D, 1,25-Dihydroxy: 31.9 pg/mL (ref 24.8–81.5)

## 2023-12-03 LAB — SEDIMENTATION RATE: Sed Rate: 49 mm/h — ABNORMAL HIGH (ref 0–30)

## 2023-12-03 LAB — PARATHYROID HORMONE, INTACT (NO CA): PTH: 54 pg/mL (ref 15–65)

## 2023-12-03 LAB — BASIC METABOLIC PANEL WITH GFR
Anion gap: 7 (ref 5–15)
BUN: 7 mg/dL (ref 6–20)
CO2: 26 mmol/L (ref 22–32)
Calcium: 9.5 mg/dL (ref 8.9–10.3)
Chloride: 104 mmol/L (ref 98–111)
Creatinine, Ser: <0.3 mg/dL — ABNORMAL LOW (ref 0.44–1.00)
Glucose, Bld: 65 mg/dL — ABNORMAL LOW (ref 70–99)
Potassium: 2.9 mmol/L — ABNORMAL LOW (ref 3.5–5.1)
Sodium: 137 mmol/L (ref 135–145)

## 2023-12-03 LAB — C-REACTIVE PROTEIN: CRP: 4 mg/dL — ABNORMAL HIGH (ref <1.0)

## 2023-12-03 LAB — GLUCOSE, CAPILLARY
Glucose-Capillary: 69 mg/dL — ABNORMAL LOW (ref 70–99)
Glucose-Capillary: 82 mg/dL (ref 70–99)
Glucose-Capillary: 106 mg/dL — ABNORMAL HIGH (ref 70–99)
Glucose-Capillary: 98 mg/dL (ref 70–99)
Glucose-Capillary: 117 mg/dL — ABNORMAL HIGH (ref 70–99)

## 2023-12-03 MED ORDER — POTASSIUM CHLORIDE 10 MEQ/100ML IV SOLN
10.0000 meq | INTRAVENOUS | Status: AC
  Administered 2023-12-03 (×4): 10 meq via INTRAVENOUS
  Filled 2023-12-03 (×4): qty 100

## 2023-12-03 MED ORDER — POLYETHYLENE GLYCOL 3350 17 G PO PACK
17.0000 g | PACK | Freq: Every day | ORAL | Status: DC
  Administered 2023-12-03 – 2023-12-11 (×3): 17 g via ORAL
  Filled 2023-12-03 (×8): qty 1

## 2023-12-03 NOTE — Progress Notes (Addendum)
 Progress Note   [name] DOB: 12-04-1960 DOA: 12/01/2023     2 DOS: the patient was seen and examined on 12/03/2023   Brief hospital course: Ruth Mayo is a 63 y.o. 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   07/28: to ED. Concern for worsening sacral ulcer w/ osteomyelitis (see A/P for CT report). Admitted for sepsis d/t this as well as UTI, CT also concerning for possible aspiration pneumonia/pneumonitis.  07/29: per general surgery does not need debridement. Consult to ID pending.  Continue daily dressing   Assessment and Plan:  Sacral osteomyelitis  CT showed enlarged sacral wound with osteomyelitis Clinically not septic. Elevated Sed rate 43, elevated CRP 8.5 Increasing size and depth of the sacral decubitus ulcer, measuring 5 cm deep and extending 11.5 cm in the craniocaudal extent. Progressive bony destruction of the lower sacrum, consistent with osteomyelitis. Along the right lateral aspect in the medial right gluteal musculature, there is a small peripherally enhancing gas and fluid collection measuring 1.7 x 1.6 x 1.8 cm. This could reflect an intramuscular abscess or gas and fluid trapped within a lateral extension of the decubitus ulcer. Empiric antimicrobial treatment with vancomycin  and meropenem  (patient received 1 dose of Rocephin  in ED) Pain control Blood cultures x 2  ESR and CRP wound care consult General surgery consult - no need for debridement  Infectious disease consult - will need  abx likely long term    Complicated UTI (urinary tract infection) Presence of indwelling Foley catheter, likely associated with Foley catheter Multiple small nonobstructive bilateral renal calculi. No Hydronephrosis  Foley catheter was changed in ED on meropenem  f/u urine culture Consider urology follow up    CAD (coronary artery disease) HTN HLD s/p CABG. No CP On statin, , ACE,    Hypothyroidism Recent TSH 10/05/23 was >20,  Hypothyroid could certainly contribute to altered mental status Pending TSH here Synthroid    Type 2 diabetes mellitus with peripheral neuropathy (HCC) Glargine insulin  80 units daily SSI   Hypercalcemia is chronic issue.  Calcium  11.1 on admission. Check intact, PTH related protein, 1,25-hydroxy vitamin D   Hypokalemia and Hypophosphatemia: Potassium 2.9, phosphorus 1.7, magnesium normal 2.2 Repleted potassium and phosphorus Monitor labs    Depression with anxiety Continue home Zoloft       Subjective: She denies any fever or chills  Physical Exam: Vitals:   12/02/23 1532 12/02/23 1929 12/03/23 0414 12/03/23 0739  BP: (!) 101/56 (!) 98/51 (!) 101/59 (!) 96/46  Pulse: 82 87 83 76  Resp: 15 18 17 17   Temp: 98.2 F (36.8 C) 97.9 F (36.6 C) 97.9 F (36.6 C) 98 F (36.7 C)  TempSrc: Oral  Oral   SpO2: 98% 100% 100% 98%  Weight:      Height:       Constitutional: Alert, awake, calm, comfortable HEENT: Neck supple Respiratory: clear to auscultation bilaterally, no wheezing, no crackles. Normal respiratory effort. No accessory muscle use.  Cardiovascular: Regular rate and rhythm, no murmurs / rubs / gallops. No extremity edema. 2+ pedal pulses. No carotid bruits.  Abdomen: no tenderness, no masses palpated. No hepatosplenomegaly. Bowel sounds positive.  Musculoskeletal: Sacral wound  present Skin: no rashes, lesions, ulcers. No induration Neurologic: CN 2-12 grossly intact. Sensation intact, DTR normal. Strength 5/5 x all 4 extremities.   Psychiatric: Normal judgment and insight. Alert and oriented x 3. Normal mood.   Data Reviewed:  Potassium 2.9  Family Communication: None  Disposition: Status is: Inpatient Remains inpatient appropriate because: Ongoing treatment and recovery from osteomyelitis of sacral wound  Planned Discharge Destination: Skilled nursing facility    Time spent: 35 minutes  Author: Anzley Dibbern, MD 12/03/2023 3:47 PM  For on call review [email or web address].

## 2023-12-03 NOTE — Progress Notes (Signed)
 1200 Dressing changed to sacrum per wound care orders. Pt tolerated.   1214 Cultures sent to lab at this time

## 2023-12-03 NOTE — Consult Note (Signed)
 NAME: Ruth Mayo  DOB: [birth year]  MRN: [id number]  Date/Time: 12/03/2023 11:25 AM  REQUESTING PROVIDER: Dr.Alexander Subjective:  REASON FOR CONSULT: sacral decubitus ?Chart reviewed in detail including all records in care everywhere Ruth Mayo is a 63 y.o.female  with a history of CAD s/p CABG 01/13/23, complicated by sternal wound dehiscence, PE , resp failure , sacral decubitus  obesity, trach/peg Oct 2024, chronic foley, DM, hypothyroidism multiple hospitalization since last year for sepsis, wound infection, B fragilis bacteremia 04/15/23  , 12/12 MRI sacrum acute OM treated [facility]olin [facility], then readmission Feb 2025 for sternal wound infection, sacral stage IV and osteo ( resistant acinetobacter- given 6 weeksof meropenem  ( 4/3)  and vanco for the sternl culture corynebacterium, she never followed up with ID, was  in [facility] and then went home  Was seen in [place] ED on 10/05/23 for somnolence Presented from [facility] on 12/01/23 with weakness and concern for sacral wound infection Pt denies fever, chills, N/V,  Vitals  12/01/23 14:34  BP 103/57 !  Temp 98.3 F (36.8 C)  Pulse Rate 87  Resp 17  SpO2 100 %     Latest Reference Range & Units 12/01/23 14:45  WBC 4.0 - 10.5 K/uL 9.6  Hemoglobin 12.0 - 15.0 g/dL 87.6  HCT 63.9 - 53.9 % 40.1  Platelets 150 - 400 K/uL 432 (H)  Creatinine 0.44 - 1.00 mg/dL 9.49   CT abd/pelvis done Blood culture sent  Started on vanco and meropenem  I am asked to see the patient for the above reason  Past Medical History:  Diagnosis Date   Anxiety    Arthritis    CAD (coronary artery disease)    Depression    Diabetes mellitus    History of iron deficiency anemia    HLD (hyperlipidemia)    HTN (hypertension)    Hypothyroidism    Migraines     Past Surgical History:  Procedure Laterality Date   CESAREAN SECTION     CORONARY ARTERY BYPASS GRAFT     IR REMOVAL TUN CV CATH W/O FL  03/20/2023   TUBAL LIGATION      Social  History   Socioeconomic History   Marital status: Divorced    Spouse name: Cathlyn   Number of children: 1   Years of education: Not on file   Highest education level: Not on file  Occupational History   Not on file  Tobacco Use   Smoking status: Former    Current packs/day: 0.00    Average packs/day: 1 pack/day for 25.0 years (25.0 ttl pk-yrs)    Types: Cigarettes    Start date: 06/06/1978    Quit date: 06/07/2003    Years since quitting: 20.5   Smokeless tobacco: Never  Vaping Use   Vaping status: Never Used  Substance and Sexual Activity   Alcohol use: Yes    Comment: a couple glasses wine a week   Drug use: No   Sexual activity: Not Currently    Birth control/protection: Surgical    Comment: BTL   Other Topics Concern   Not on file  Social History Narrative   Work - desk work - Location manager   Divorced - lives with son   Social Drivers of Health   Financial Resource Strain: Patient Unable To Answer (03/04/2023)   Received from [facility]   Overall Financial Resource Strain (CARDIA)    Difficulty of Paying Living Expenses: Patient unable to answer  Food Insecurity: No  Food Insecurity (12/01/2023)   Hunger Vital Sign    Worried About Running Out of Food in the Last Year: Never true    Ran Out of Food in the Last Year: Never true  Transportation Needs: No Transportation Needs (12/01/2023)   PRAPARE - Administrator, Civil Service (Medical): No    Lack of Transportation (Non-Medical): No  Physical Activity: Insufficiently Active (07/23/2022)   Received from [facility]   Exercise Vital Sign    On average, how many days per week do you engage in moderate to strenuous exercise (like a brisk walk)?: 1 day    On average, how many minutes do you engage in exercise at this level?: 30 min  Stress: No Stress Concern Present (03/02/2023)   Received from [facility]   [facility] of Occupational Health - Occupational Stress Questionnaire     Feeling of Stress : Not at all  Recent Concern: Stress - Stress Concern Present (01/14/2023)   Received from [facility] of Occupational Health - Occupational Stress Questionnaire    Feeling of Stress : To some extent  Social Connections: Patient Unable To Answer (03/04/2023)   Received from [facility]   Social Connection and Isolation Panel    In a typical week, how many times do you talk on the phone with family, friends, or neighbors?: Patient unable to answer    How often do you get together with friends or relatives?: Patient unable to answer    How often do you attend church or religious services?: Patient unable to answer    Do you belong to any clubs or organizations such as church groups, unions, fraternal or athletic groups, or school groups?: Patient unable to answer    How often do you attend meetings of the clubs or organizations you belong to?: Patient unable to answer    Are you married, widowed, divorced, separated, never married, or living with a partner?: Patient unable to answer  Intimate Partner Violence: Not At Risk (12/01/2023)   Humiliation, Afraid, Rape, and Kick questionnaire    Fear of Current or Ex-Partner: No    Emotionally Abused: No    Physically Abused: No    Sexually Abused: No    Family History  Problem Relation Age of Onset   Diabetes Mother    Diabetes Sister    Hypertension Sister    Thyroid  disease Sister    Diabetes Maternal Grandmother    No Known Allergies I? Current Facility-Administered Medications  Medication Dose Route Frequency Provider Last Rate Last Admin   acetaminophen  (TYLENOL ) tablet 650 mg  650 mg Per Tube Q6H PRN Niu, Xilin, MD       Chlorhexidine  Gluconate Cloth 2 % PADS 6 each  6 each Topical Daily Alexander, Natalie, DO   6 each at 12/03/23 9040   gabapentin  (NEURONTIN ) capsule 900 mg  900 mg Per Tube TID Niu, Xilin, MD   900 mg at 12/03/23 9040   heparin  injection 5,000 Units  5,000 Units Subcutaneous  Q8H Niu, Xilin, MD   5,000 Units at 12/03/23 0522   hydrALAZINE  (APRESOLINE ) injection 5 mg  5 mg Intravenous Q2H PRN Niu, Xilin, MD       insulin  aspart (novoLOG ) injection 0-5 Units  0-5 Units Subcutaneous QHS Niu, Xilin, MD       insulin  aspart (novoLOG ) injection 0-9 Units  0-9 Units Subcutaneous TID WC Niu, Xilin, MD       levothyroxine  (SYNTHROID ) tablet 50 mcg  50 mcg Per Tube Q0600 Niu, Xilin, MD   50 mcg at 12/03/23 0522   LORazepam  (ATIVAN ) tablet 1 mg  1 mg Per Tube BID Niu, Xilin, MD   1 mg at 12/03/23 0959   meropenem  (MERREM ) 1 g in sodium chloride  0.9 % 100 mL IVPB  1 g Intravenous Q8H Nazari, Walid A, RPH 200 mL/hr at 12/03/23 9461 Infusion Verify at 12/03/23 0538   morphine  (PF) 2 MG/ML injection 2 mg  2 mg Intravenous Q2H PRN Alexander, Natalie, DO   2 mg at 12/02/23 1930   ondansetron  (ZOFRAN ) injection 4 mg  4 mg Intravenous Q8H PRN Niu, Xilin, MD       oxyCODONE -acetaminophen  (PERCOCET/ROXICET) 5-325 MG per tablet 1 tablet  1 tablet Per Tube Q4H PRN Alexander, Natalie, DO   1 tablet at 12/03/23 1041   polyethylene glycol (MIRALAX  / GLYCOLAX ) packet 17 g  17 g Oral Daily Paudel, Nena, MD   17 g at 12/03/23 1041   potassium chloride  10 mEq in 100 mL IVPB  10 mEq Intravenous Q1 Hr x 4 Paudel, Keshab, MD 100 mL/hr at 12/03/23 1041 10 mEq at 12/03/23 1041   sertraline  (ZOLOFT ) tablet 100 mg  100 mg Per Tube Daily Niu, Xilin, MD   100 mg at 12/03/23 9040   vancomycin  (VANCOREADY) IVPB 750 mg/150 mL  750 mg Intravenous Q12H Nazari, Walid A, RPH   Stopped at 12/02/23 2152     Abtx:  Anti-infectives (From admission, onward)    Start     Dose/Rate Route Frequency Ordered Stop   12/02/23 1000  vancomycin  (VANCOREADY) IVPB 750 mg/150 mL        750 mg 150 mL/hr over 60 Minutes Intravenous Every 12 hours 12/01/23 2159     12/01/23 2200  meropenem  (MERREM ) 1 g in sodium chloride  0.9 % 100 mL IVPB        1 g 200 mL/hr over 30 Minutes Intravenous Every 8 hours 12/01/23 2115      12/01/23 2130  vancomycin  (VANCOREADY) IVPB 1750 mg/350 mL        1,750 mg 175 mL/hr over 120 Minutes Intravenous  Once 12/01/23 2117 12/02/23 0149   12/01/23 2045  cefTRIAXone  (ROCEPHIN ) 2 g in sodium chloride  0.9 % 100 mL IVPB        2 g 200 mL/hr over 30 Minutes Intravenous Once 12/01/23 2033 12/01/23 2227       REVIEW OF SYSTEMS: pt is a limited historian She follows commands but sleeps alot Const: negative fever, negative chills Eyes: negative diplopia or visual changes, negative eye pain ENT: negative coryza, negative sore throat Resp: negative cough, hemoptysis, dyspnea Cards: negative for chest pain, palpitations, lower extremity edema HL:qnozb GI: Negative for abdominal pain, diarrhea, bleeding, constipation Skin: negative for rash and pruritus Heme: negative for easy bruising and gum/nose bleeding MS:  muscle weakness Neurolo:wasting of arms /legs, does not walk Psych: anxiety, depression  Endocrine: n diabetes Allergy/Immunology- NA: Objective:  VITALS:  BP (!) 96/46   Pulse 76   Temp 98 F (36.7 C)   Resp 17   Ht 5' 1 (1.549 m)   Wt 80.3 kg   SpO2 98%   BMI 33.45 kg/m  LDA Foley  PHYSICAL EXAM:  General: somnolent , but wakes up , cooperative, no distress, oriented X 4- minimal confusion Head: Normocephalic, without obvious abnormality, atraumatic. Eyes: Conjunctivae clear, anicteric sclerae. Pupils are equal ENT Nares normal. No drainage or sinus tenderness. Lips, mucosa, and tongue normal. No  Thrush Neck: , symmetrical, no adenopathy, thyroid : non tender no carotid bruit and no JVD. Back:  Sacral decubitus ciruclar - healthy granulation tissue     Sternal scar- scab      Lungs:b/l air entry Heart: s1s2 Abdomen: Soft, peg Extremities: wating interosseous muscles hands Skin: No rashes or lesions. Or bruising Lymph: Cervical, supraclavicular normal. Neurologic: b/l foot drop- wasting Pertinent Labs Lab Results CBC    Component Value  Date/Time   WBC 6.3 12/03/2023 0419   RBC 3.36 (L) 12/03/2023 0419   HGB 9.6 (L) 12/03/2023 0419   HGB 11.4 01/02/2018 0918   HCT 30.7 (L) 12/03/2023 0419   HCT 35.6 01/02/2018 0918   PLT 321 12/03/2023 0419   PLT 208 01/02/2018 0918   MCV 91.4 12/03/2023 0419   MCV 88 01/02/2018 0918   MCH 28.6 12/03/2023 0419   MCHC 31.3 12/03/2023 0419   RDW 13.9 12/03/2023 0419   RDW 13.7 01/02/2018 0918   LYMPHSABS 1.1 12/01/2023 1445   LYMPHSABS 1.1 01/02/2018 0918   MONOABS 0.5 12/01/2023 1445   EOSABS 0.1 12/01/2023 1445   EOSABS 0.1 01/02/2018 0918   BASOSABS 0.0 12/01/2023 1445   BASOSABS 0.0 01/02/2018 0918       Latest Ref Rng & Units 12/03/2023    4:19 AM 12/02/2023    2:31 AM 12/01/2023    2:45 PM  CMP  Glucose 70 - 99 mg/dL 65  70  867   BUN 6 - 20 mg/dL 7  10  12    Creatinine 0.44 - 1.00 mg/dL <9.69  9.67  9.49   Sodium 135 - 145 mmol/L 137  142  141   Potassium 3.5 - 5.1 mmol/L 2.9  3.3  3.4   Chloride 98 - 111 mmol/L 104  104  99   CO2 22 - 32 mmol/L 26  24  23    Calcium  8.9 - 10.3 mg/dL 9.5  89.7  88.8   Total Protein 6.5 - 8.1 g/dL   6.1   Total Bilirubin 0.0 - 1.2 mg/dL   1.3   Alkaline Phos 38 - 126 U/L   118   AST 15 - 41 U/L   23   ALT 0 - 44 U/L   14       Microbiology: Recent Results (from the past 240 hours)  Urine Culture     Status: Abnormal   Collection Time: 12/01/23  7:11 PM   Specimen: Urine, Random  Result Value Ref Range Status   Specimen Description   Final    URINE, RANDOM Performed at [facility], [address], [place], [place] [address]    Special Requests   Final    NONE Reflexed from [id number] Performed at [facility], [address] [place], [place] [address]    Culture >=100,000 COLONIES/mL ESCHERICHIA COLI (A)  Final   Report Status 12/03/2023 FINAL  Final   Organism ID, Bacteria ESCHERICHIA COLI (A)  Final      Susceptibility   Escherichia coli - MIC*    AMPICILLIN 8 SENSITIVE Sensitive     CEFAZOLIN <=4  SENSITIVE Sensitive     CEFEPIME <=0.12 SENSITIVE Sensitive     CEFTRIAXONE  <=0.25 SENSITIVE Sensitive     CIPROFLOXACIN <=0.25 SENSITIVE Sensitive     GENTAMICIN <=1 SENSITIVE Sensitive     IMIPENEM <=0.25 SENSITIVE Sensitive     NITROFURANTOIN <=16 SENSITIVE Sensitive     TRIMETH/SULFA <=20 SENSITIVE Sensitive     AMPICILLIN/SULBACTAM 4 SENSITIVE Sensitive  PIP/TAZO <=4 SENSITIVE Sensitive ug/mL    * >=100,000 COLONIES/mL ESCHERICHIA COLI  Culture, blood (Routine X 2) w Reflex to ID Panel     Status: None (Preliminary result)   Collection Time: 12/02/23  2:31 AM   Specimen: BLOOD RIGHT ARM  Result Value Ref Range Status   Specimen Description BLOOD RIGHT ARM  Final   Special Requests   Final    BOTTLES DRAWN AEROBIC AND ANAEROBIC Blood Culture adequate volume   Culture   Final    NO GROWTH 1 DAY Performed at [facility], [address] [place], [place] [address]    Report Status PENDING  Incomplete  Culture, blood (Routine X 2) w Reflex to ID Panel     Status: None (Preliminary result)   Collection Time: 12/02/23  2:39 AM   Specimen: BLOOD LEFT HAND  Result Value Ref Range Status   Specimen Description BLOOD LEFT HAND  Final   Special Requests   Final    BOTTLES DRAWN AEROBIC AND ANAEROBIC Blood Culture adequate volume   Culture   Final    NO GROWTH 1 DAY Performed at [facility], [address], [place], [place] [address]    Report Status PENDING  Incomplete    Lines and Device Date on insertion # of days DC  Central line     Foley     ETT       IMAGING RESULTS: CT abd/pelvis reviewed  sacral decubitus ulcer of 5X 11.5 Progressive bony destruction of the lower sacrum, consistent with osteomyelitis. Small collection rt gluteal area I have personally reviewed the films ? Impression/Recommendation 63 y.o.female  with a history of CAD s/p CABG 01/13/23, complicated by sternal wound dehiscence, PE , resp failure , sacral decubitus  obesity,  trach/peg Oct 2024, chronic foley, DM, hypothyroidism   1) weakness   20 Stage IV sacral decubitus- no obvious infection She will benefit from wound vac  Off loading Air mattress Nutrition improvement Avoid stool contamination sHe does not need IV antibiotics or Po antibiotics   Pan sensitive ecolin in urine culture- chronic foley could be colonization, but because of thickened bladder wall which can be cysttitis okay t do 5 days of antibiotics  Healed sternal dehiscence wound- has a scab  Anemia  Hypoalbuminemia  CAD s/p CABG Sept 2024 with complications  Peg    Chronic foley Immobility  Wasting of extremities This consult involved complex antimicrobial management ? Discussed with care team

## 2023-12-04 DIAGNOSIS — N3289 Other specified disorders of bladder: Secondary | ICD-10-CM | POA: Diagnosis not present

## 2023-12-04 DIAGNOSIS — L89154 Pressure ulcer of sacral region, stage 4: Secondary | ICD-10-CM

## 2023-12-04 DIAGNOSIS — N3000 Acute cystitis without hematuria: Secondary | ICD-10-CM

## 2023-12-04 DIAGNOSIS — B9620 Unspecified Escherichia coli [E. coli] as the cause of diseases classified elsewhere: Secondary | ICD-10-CM | POA: Diagnosis not present

## 2023-12-04 DIAGNOSIS — M4628 Osteomyelitis of vertebra, sacral and sacrococcygeal region: Secondary | ICD-10-CM | POA: Diagnosis not present

## 2023-12-04 LAB — GLUCOSE, CAPILLARY
Glucose-Capillary: 83 mg/dL (ref 70–99)
Glucose-Capillary: 96 mg/dL (ref 70–99)
Glucose-Capillary: 124 mg/dL — ABNORMAL HIGH (ref 70–99)
Glucose-Capillary: 116 mg/dL — ABNORMAL HIGH (ref 70–99)

## 2023-12-04 LAB — CBC
HCT: 33.5 % — ABNORMAL LOW (ref 36.0–46.0)
Hemoglobin: 9.9 g/dL — ABNORMAL LOW (ref 12.0–15.0)
MCH: 28.6 pg (ref 26.0–34.0)
MCHC: 29.6 g/dL — ABNORMAL LOW (ref 30.0–36.0)
MCV: 96.8 fL (ref 80.0–100.0)
Platelets: 185 K/uL (ref 150–400)
RBC: 3.46 MIL/uL — ABNORMAL LOW (ref 3.87–5.11)
RDW: 14.1 % (ref 11.5–15.5)
WBC: 4.9 K/uL (ref 4.0–10.5)
nRBC: 0 % (ref 0.0–0.2)

## 2023-12-04 LAB — MAGNESIUM: Magnesium: 1.7 mg/dL (ref 1.7–2.4)

## 2023-12-04 LAB — PHOSPHORUS: Phosphorus: 1.2 mg/dL — ABNORMAL LOW (ref 2.5–4.6)

## 2023-12-04 MED ORDER — LEVOTHYROXINE SODIUM 112 MCG PO TABS
112.0000 ug | ORAL_TABLET | Freq: Every day | ORAL | Status: DC
  Administered 2023-12-05 – 2023-12-11 (×7): 112 ug
  Filled 2023-12-04 (×8): qty 1

## 2023-12-04 MED ORDER — SODIUM CHLORIDE 0.9 % IV BOLUS
1000.0000 mL | Freq: Once | INTRAVENOUS | Status: AC
  Administered 2023-12-04: 1000 mL via INTRAVENOUS

## 2023-12-04 MED ORDER — K PHOS MONO-SOD PHOS DI & MONO 155-852-130 MG PO TABS
250.0000 mg | ORAL_TABLET | Freq: Every day | ORAL | Status: DC
  Administered 2023-12-04 – 2023-12-05 (×2): 250 mg via ORAL
  Filled 2023-12-04 (×2): qty 1

## 2023-12-04 MED ORDER — MIDODRINE HCL 5 MG PO TABS
5.0000 mg | ORAL_TABLET | Freq: Two times a day (BID) | ORAL | Status: DC
  Administered 2023-12-04 – 2023-12-05 (×2): 5 mg via ORAL
  Filled 2023-12-04 (×2): qty 1

## 2023-12-04 MED ORDER — APIXABAN 5 MG PO TABS: 5.0000 mg | ORAL_TABLET | Freq: Two times a day (BID) | ORAL | Status: DC

## 2023-12-04 MED ORDER — APIXABAN 5 MG PO TABS
5.0000 mg | ORAL_TABLET | Freq: Two times a day (BID) | ORAL | Status: DC
  Administered 2023-12-04 – 2023-12-11 (×15): 5 mg
  Filled 2023-12-04 (×15): qty 1

## 2023-12-04 MED ORDER — HYDROCORTISONE 1 % EX CREA
TOPICAL_CREAM | Freq: Three times a day (TID) | CUTANEOUS | Status: DC | PRN
  Filled 2023-12-04: qty 28

## 2023-12-04 MED ORDER — AMOXICILLIN 500 MG PO CAPS
500.0000 mg | ORAL_CAPSULE | Freq: Three times a day (TID) | ORAL | Status: DC
  Administered 2023-12-04 – 2023-12-05 (×3): 500 mg via ORAL
  Filled 2023-12-04 (×4): qty 1

## 2023-12-04 NOTE — Progress Notes (Signed)
       CROSS COVER NOTE  NAME: Elysabeth Aust MRN: [id number] DOB : Aug 26, 1960    Concern as stated by nurse / staff   atients BP was 89/53, Map was 64. Looks like she was low like this earlier today. Not sure if the provider was made aware. She is alert. That was the 3rd attempt. First two bp's were in the 70's systolic. thank you      Pertinent findings on chart review: Progress note and MAR reviewed: Briefly, patient admitted with sacral osteomyelitis, not meeting sepsis criteria.  Having intermittently blood pressures BP has been fluctuating with systolic from 91-112, diastolic 47-65 No fluid boluses administered on review of MAR  Patient Assessment    12/04/2023    7:33 PM 12/04/2023    3:28 PM 12/04/2023   10:59 AM  Vitals with BMI  Systolic 89 86 112  Diastolic 53 52 65  Pulse 79 91 92     Assessment and  Interventions   Assessment:  Hypotension, possibly related to morphine  and oxycodone  administration -- not otherwise meeting sepsis criteria  Plan: Will treat with a fluid bolus Midodrine  5 mg given expectation of ongoing need for pain meds which could lower blood pressure Holding off on sepsis workup with an additional sepsis criteria

## 2023-12-04 NOTE — Progress Notes (Signed)
 Spoke with husband in regard to pt care. Husband has concerns if pt is d/c to rehab when medically clear that he does not want pt to return to [facility] and rehab. Made CM aware. Also per husband pt just is not motivated anymore about anything per protocol asked chaplain to see pt.

## 2023-12-04 NOTE — TOC Initial Note (Signed)
 Transition of Care [facility]) - Initial/Assessment Note    Patient Details  Name: Ruth Mayo MRN: [id number] Date of Birth: 03-Jul-1960  Transition of Care [facility]) CM/SW Contact:    Racheal LITTIE Schimke, RN Phone Number: 12/04/2023, 4:22 PM  Clinical Narrative: Patient is in LTC at [facility], however husband does not want her to return. Prefer that she be closer to home, [place]. [place], [place], [place] and [place] areas are the areas that will be closer so family can visit.                       Patient Goals and CMS Choice            Expected Discharge Plan and Services                                              Prior Living Arrangements/Services                       Activities of Daily Living   ADL Screening (condition at time of admission) Independently performs ADLs?: No Does the patient have a NEW difficulty with bathing/dressing/toileting/self-feeding that is expected to last >3 days?: No Does the patient have a NEW difficulty with getting in/out of bed, walking, or climbing stairs that is expected to last >3 days?: No Does the patient have a NEW difficulty with communication that is expected to last >3 days?: No Is the patient deaf or have difficulty hearing?: No Does the patient have difficulty seeing, even when wearing glasses/contacts?: No Does the patient have difficulty concentrating, remembering, or making decisions?: No  Permission Sought/Granted                  Emotional Assessment              Admission diagnosis:  Acute cystitis without hematuria [N30.00] Sacral osteomyelitis (HCC) [M46.28] Patient Active Problem List   Diagnosis Date Noted   Hypophosphatemia 12/02/2023   Sacral osteomyelitis (HCC) 12/01/2023   Type 2 diabetes mellitus with peripheral neuropathy (HCC) 12/01/2023   HTN (hypertension) 12/01/2023   HLD (hyperlipidemia) 12/01/2023   Depression with anxiety 12/01/2023   Presence of indwelling Foley  catheter 12/01/2023   CAD (coronary artery disease) 12/01/2023   Hypercalcemia 12/01/2023   Hypokalemia 12/01/2023   Complicated UTI (urinary tract infection) 12/01/2023   Hypothyroidism    Elevated LDL cholesterol level 06/20/2017   Acquired hypothyroidism 06/20/2017   Diabetic neuropathy (HCC) 06/13/2017   Stress at home 06/13/2017   Type 2 diabetes mellitus (HCC) 07/03/2011   Anxiety disorder 07/03/2011   Dyspepsia 07/03/2011   Obesity (BMI 30.0-34.9) 07/03/2011   PCP:  Allen Lauraine LITTIE, PA-C Pharmacy:   [facility] [id number] - [place], [place] - [address] [place] [address] Phone: [phone] Fax: [phone]     Social Drivers of Health (SDOH) Social History: SDOH Screenings   Food Insecurity: No Food Insecurity (12/01/2023)  Housing: Low Risk  (12/01/2023)  Transportation Needs: No Transportation Needs (12/01/2023)  Utilities: Not At Risk (12/01/2023)  Financial Resource Strain: Patient Unable To Answer (03/04/2023)   Received from [facility]  Physical Activity: Insufficiently Active (07/23/2022)   Received from [facility]  Social Connections: Patient Unable To Answer (03/04/2023)   Received from [facility]  Stress: No Stress Concern Present (03/02/2023)  Received from [facility]  Recent Concern: Stress - Stress Concern Present (01/14/2023)   Received from [facility]  Tobacco Use: Medium Risk (12/01/2023)   SDOH Interventions:     Readmission Risk Interventions     No data to display

## 2023-12-04 NOTE — Progress Notes (Signed)
 Date of Admission:  12/01/2023      ID: Ruth Mayo is a 63 y.o. female  Principal Problem:   Sacral osteomyelitis (HCC) Active Problems:   Obesity (BMI 30.0-34.9)   Hypothyroidism   Type 2 diabetes mellitus with peripheral neuropathy (HCC)   HTN (hypertension)   HLD (hyperlipidemia)   Depression with anxiety   Presence of indwelling Foley catheter   CAD (coronary artery disease)   Hypercalcemia   Hypokalemia   Complicated UTI (urinary tract infection)   Hypophosphatemia   Husband at bed side HE was able to give more medica history In June pt was in [facility] ad had sacral wound debridement and antibiotics for a few weeks. Macerate dskin around Subjective: More alert and talkative now  Medications:   apixaban   5 mg Per Tube BID   Chlorhexidine  Gluconate Cloth  6 each Topical Daily   gabapentin   900 mg Per Tube TID   insulin  aspart  0-5 Units Subcutaneous QHS   insulin  aspart  0-9 Units Subcutaneous TID WC   [START ON 12/05/2023] levothyroxine   112 mcg Per Tube Q0600   LORazepam   1 mg Per Tube BID   phosphorus  250 mg Oral Daily   polyethylene glycol  17 g Oral Daily   sertraline   100 mg Per Tube Daily    Objective: Vital signs in last 24 hours: Patient Vitals for the past 24 hrs:  BP Temp Temp src Pulse Resp SpO2  12/04/23 1059 112/65 -- -- 92 -- --  12/04/23 0744 (!) 91/47 97.9 F (36.6 C) Oral 80 16 100 %  12/04/23 0349 100/60 98 F (36.7 C) Oral 75 16 100 %  12/03/23 1954 (!) 103/59 98 F (36.7 C) Oral 77 16 98 %  12/03/23 1557 101/60 98.1 F (36.7 C) Oral 78 16 98 %     Lines and Device Date on insertion # of days DC  Chiropractor     Foley     ETT       PHYSICAL EXAM:  General: Alert, cooperative, no distress, appears stated age.  Sage IV sacral decub Clean  Granulation tissue   Srenal wound healed   Lungs: b/l air entry Heart: Regular rate and rhythm, no murmur, rub or gallop. Abdomen: Soft, non-tender,not  distended. Bowel sounds normal. No masses Extremities: atraumatic, no cyanosis. No edema. No clubbing Skin: No rashes or lesions. Or bruising Lymph: Cervical, supraclavicular normal. Neurologic: wasting of hands and feet Lab Results    Latest Ref Rng & Units 12/04/2023    3:02 AM 12/03/2023    4:19 AM 12/02/2023    2:31 AM  CBC  WBC 4.0 - 10.5 K/uL 4.9  6.3  7.9   Hemoglobin 12.0 - 15.0 g/dL 9.9  9.6  89.5   Hematocrit 36.0 - 46.0 % 33.5  30.7  33.3   Platelets 150 - 400 K/uL 185  321  375        Latest Ref Rng & Units 12/03/2023    4:08 PM 12/03/2023    4:19 AM 12/02/2023    2:31 AM  CMP  Glucose 70 - 99 mg/dL 97  65  70   BUN 6 - 20 mg/dL 8  7  10    Creatinine 0.44 - 1.00 mg/dL <9.69  <9.69  9.67   Sodium 135 - 145 mmol/L 137  137  142   Potassium 3.5 - 5.1 mmol/L 3.8  2.9  3.3   Chloride 98 - 111 mmol/L 107  104  104   CO2 22 - 32 mmol/L 26  26  24    Calcium  8.9 - 10.3 mg/dL 9.2  9.5  89.7   Total Protein 6.5 - 8.1 g/dL 5.1     Total Bilirubin 0.0 - 1.2 mg/dL 0.7     Alkaline Phos 38 - 126 U/L 95     AST 15 - 41 U/L 31     ALT 0 - 44 U/L 16         Microbiology:  Studies/Results:   Assessment/Plan:  63 y.o.female  with a history of CAD s/p CABG 01/13/23, complicated by sternal wound dehiscence, PE , resp failure , sacral decubitus  obesity, trach/peg Oct 2024, chronic foley, DM, hypothyroidism    1) weakness    20 Stage IV sacral decubitus- no obvious infection She will benefit from wound vac  Off loading Air mattress Nutrition improvement Avoid stool contamination sHe does not need IV antibiotics- DC Nor  Po antibiotics    Pan sensitive ecolin in urine culture- chronic foley could be colonization, but because of thickened bladder wall which can be cysttitis okay to do 5 days of antibiotics   Healed sternal dehiscence wound- has a scab   Anemia   Hypoalbuminemia   CAD s/p CABG Sept 2024 with complications   Peg     Chronic foley Immobility    Wasting of extremities This consult involved complex antimicrobial management ? Discussed the management with patent and her husband at bed side Discussed with hospitlaist ID will sign off- call if needed

## 2023-12-04 NOTE — Progress Notes (Signed)
 Progress Note   [name] DOB: March 31, 1961 DOA: 12/01/2023     3 DOS: the patient was seen and examined on 12/04/2023   Brief hospital course: Jeanne Diefendorf is a 63 y.o. 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   07/28: to ED. Concern for worsening sacral ulcer w/ osteomyelitis (see A/P for CT report). Admitted for sepsis d/t this as well as UTI, CT also concerning for possible aspiration pneumonia/pneumonitis.  07/29: per general surgery does not need debridement. ID advised to antibiotics but wound vac Continue daily dressing  Assessment and Plan:  Sacral osteomyelitis  CT showed enlarged sacral wound with osteomyelitis Clinically not septic. Elevated Sed rate 43, elevated CRP 8.5 Increasing size and depth of the sacral decubitus ulcer, measuring 5 cm deep and extending 11.5 cm in the craniocaudal extent. Progressive bony destruction of the lower sacrum, consistent with osteomyelitis. Along the right lateral aspect in the medial right gluteal musculature, there is a small peripherally enhancing gas and fluid collection measuring 1.7 x 1.6 x 1.8 cm. This could reflect an intramuscular abscess or gas and fluid trapped within a lateral extension of the decubitus ulcer. Empiric antimicrobial treatment with vancomycin  and meropenem  (patient received 1 dose of Rocephin  in ED) Pain control Blood cultures x 2  ESR and CRP wound care consult General surgery consult - no need for debridement  Infectious disease consult  - no need for IV or PO abx 7/30   Complicated UTI (urinary tract infection) Presence of indwelling Foley catheter, likely associated with Foley catheter Multiple small nonobstructive bilateral renal calculi.  No hydronephrosis  Foley catheter was changed in ED on meropenem  f/u urine culture Consider urology follow up    CAD (coronary artery disease) HTN HLD s/p CABG. No CP On statin, , ACE,    Hypothyroidism Recent TSH 10/05/23 was >20,  Hypothyroid could certainly contribute to altered mental status Pending TSH here Synthroid    Type 2 diabetes mellitus with peripheral neuropathy (HCC) Glargine insulin  80 units daily SSI   Hypercalcemia is chronic issue.  Calcium  11.1 on admission. Check intact, PTH related protein, 1,25-hydroxy vitamin D   Hypokalemia and Hypophosphatemia: Potassium 2.9, phosphorus 1.7, magnesium normal 2.2 Repleted potassium and phosphorus Monitor labs    Depression with anxiety Continue home Zoloft        Subjective: No Fever or chills  Physical Exam: Vitals:   12/03/23 1954 12/04/23 0349 12/04/23 0744 12/04/23 1059  BP: (!) 103/59 100/60 (!) 91/47 112/65  Pulse: 77 75 80 92  Resp: 16 16 16    Temp: 98 F (36.7 C) 98 F (36.7 C) 97.9 F (36.6 C)   TempSrc: Oral Oral Oral   SpO2: 98% 100% 100%   Weight:      Height:       Constitutional: Alert, awake, calm, comfortable HEENT: Neck supple Respiratory: clear to auscultation bilaterally, no wheezing, no crackles. Normal respiratory effort. No accessory muscle use.  Cardiovascular: Regular rate and rhythm, no murmurs / rubs / gallops. No extremity edema. 2+ pedal pulses. No carotid bruits.  Abdomen: no tenderness, no masses palpated. No hepatosplenomegaly. Bowel sounds positive.  Musculoskeletal: no clubbing / cyanosis. No  joint deformity upper and lower extremities. Good ROM, no contractures. Normal muscle tone.  Skin: no rashes, lesions, ulcers. No induration Neurologic: CN 2-12 grossly  intact. Sensation intact, DTR normal. Strength 5/5 x all 4 extremities.  Psychiatric: Normal judgment and insight. Alert and oriented x 3. Normal mood.   Data Reviewed:  Phos 1.2  Family Communication: None  Disposition: Status is: Inpatient Remains inpatient appropriate because: Ongoing recovery from sacral wound   Planned Discharge Destination: Skilled nursing facility    Time spent: 35 minutes  Author: Nena Rebel, MD 12/04/2023 1:19 PM  For on call review [email or web address].

## 2023-12-04 NOTE — Plan of Care (Signed)

## 2023-12-05 DIAGNOSIS — M4628 Osteomyelitis of vertebra, sacral and sacrococcygeal region: Secondary | ICD-10-CM | POA: Diagnosis not present

## 2023-12-05 LAB — GLUCOSE, CAPILLARY
Glucose-Capillary: 88 mg/dL (ref 70–99)
Glucose-Capillary: 116 mg/dL — ABNORMAL HIGH (ref 70–99)
Glucose-Capillary: 120 mg/dL — ABNORMAL HIGH (ref 70–99)
Glucose-Capillary: 133 mg/dL — ABNORMAL HIGH (ref 70–99)

## 2023-12-05 MED ORDER — AMOXICILLIN 500 MG PO CAPS
500.0000 mg | ORAL_CAPSULE | Freq: Three times a day (TID) | ORAL | Status: DC
  Filled 2023-12-05: qty 1

## 2023-12-05 MED ORDER — K PHOS MONO-SOD PHOS DI & MONO 155-852-130 MG PO TABS
250.0000 mg | ORAL_TABLET | Freq: Every day | ORAL | Status: DC
  Administered 2023-12-06 – 2023-12-07 (×2): 250 mg
  Filled 2023-12-05 (×2): qty 1

## 2023-12-05 MED ORDER — AMOXICILLIN-POT CLAVULANATE 875-125 MG PO TABS
1.0000 | ORAL_TABLET | Freq: Two times a day (BID) | ORAL | Status: DC
  Administered 2023-12-05 – 2023-12-08 (×7): 1 via ORAL
  Filled 2023-12-05 (×8): qty 1

## 2023-12-05 MED ORDER — MIDODRINE HCL 5 MG PO TABS
5.0000 mg | ORAL_TABLET | Freq: Two times a day (BID) | ORAL | Status: DC
  Administered 2023-12-05 – 2023-12-08 (×6): 5 mg
  Filled 2023-12-05 (×6): qty 1

## 2023-12-05 NOTE — Plan of Care (Signed)

## 2023-12-05 NOTE — Consult Note (Addendum)
 WOC Nurse Consult Note: Reason for Consult: Requested to assess a possible wound vac on sacrum. Wound type: PI stage 4. Pressure Injury POA: Yes. Measurement: opening with 6 x 8 cm x 3 cm. Undermining with 5 cm at 3 to 6 o'clock, 2 cm at 10 to 2 o'clock. Wound bed: 80% red, 20% yellow slough. Drainage (amount, consistency, odor) Serous, moderate amount. Periwound: erythema on the left, intact. Dressing procedure/placement/frequency: Recommended the VAC dressing, ordered the supplies and applied today.  Cleansed wound with normal saline Periwound skin protected with skin barrier wipe or window framed with drape Filled wound with 2 piece of black foam  Sealed NPWT dressing at HG/187mmHG  Patient received IV/PO pain medication per bedside nurse prior to dressing change Patient tolerated procedure well  WOC nurse will continue to provide NPWT dressing changed due to the complexity of the dressing change. Changes TUE/FRI.  WOC team will follow TUE. Please reconsult if further assistance is needed. Thank-you,  Lela Holm BSN, CNS, RN, [employer], WOCN  (Phone [phone])

## 2023-12-05 NOTE — Progress Notes (Signed)
 Progress Note   [name] DOB: [birth year] DOA: 12/01/2023     4 DOS: the patient was seen and examined on 12/05/2023   Brief hospital course: Ruth Mayo is a 63 y.o. 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   07/28: to ED. Concern for worsening sacral ulcer w/ osteomyelitis (see A/P for CT report). Admitted for sepsis d/t this as well as UTI, CT also concerning for possible aspiration pneumonia/pneumonitis.  07/29: per general surgery does not need debridement. ID advised no to antibiotics but wound vac Continue daily dressing 7/31: Patient had low BP and night on-call was called. BP is soft but no symptoms. Urine our put is good.  Assessment and Plan:  Sacral osteomyelitis  CT showed enlarged sacral wound with osteomyelitis Clinically not septic. Elevated Sed rate 43, elevated CRP 8.5 Increasing size and depth of the sacral decubitus ulcer, measuring 5 cm deep and extending 11.5 cm in the craniocaudal extent. Progressive bony destruction of the lower sacrum, consistent with osteomyelitis. Along the right lateral aspect in the medial right gluteal musculature, there is a small peripherally enhancing gas and fluid collection measuring 1.7 x 1.6 x 1.8 cm. This could reflect an intramuscular abscess or gas and fluid trapped within a lateral extension of the decubitus ulcer. Empiric antimicrobial treatment with vancomycin  and meropenem  (patient received 1 dose of Rocephin  in ED) Pain control Blood cultures x 2   ESR and CRP wound care consult General surgery consult - no need for debridement  Infectious disease consult - no need for IV or PO abx 7/30     Complicated UTI (urinary tract infection) Presence of indwelling Foley catheter, likely associated with Foley catheter Multiple small nonobstructive bilateral renal calculi.  No hydronephrosis  Foley catheter was changed in ED On Augmentin  Urine culture grew pan sensitive E.coli  Consider urology follow up as outpatient   CAD (coronary artery disease) HTN HLD s/p CABG. No CP On statin, , ACE,    Hypothyroidism Recent TSH 10/05/23 was >20,  Hypothyroid could certainly contribute to altered mental status Pending TSH here Synthroid    Type 2 diabetes mellitus with peripheral neuropathy (HCC) Glargine insulin  80 units daily SSI   Hypercalcemia is chronic issue.  Calcium  11.1 on admission. Check intact, PTH related protein, 1,25-hydroxy vitamin D   Hypokalemia and Hypophosphatemia: Potassium 2.9, phosphorus 1.7, magnesium normal 2.2 Repleted potassium and phosphorus Monitor labs    Depression with anxiety Continue home Zoloft    Hypotension: 7/31 S/p IVF bolus Patient asymptomatic, may be related to Morphine  or UTI D/C IV Morphine  On Augmentin and Midodrine  Continue to monitor BP         Subjective: No fever or dizziness  Physical Exam: Vitals:   12/05/23 0415 12/05/23 0749 12/05/23 1102 12/05/23 1349  BP: (!) 95/40 (!) 90/54 (!) 88/46 (!) 99/56  Pulse: 72 81  89  Resp: 15 16    Temp: (!) 97.5 F (36.4 C) 98.4 F (36.9 C)    TempSrc:  Oral    SpO2: 100% 100%    Weight:      Height:       Constitutional: Alert, awake, calm, comfortable  HEENT: Neck supple Respiratory: clear to auscultation bilaterally, no wheezing, no crackles. Normal respiratory effort. No accessory muscle use.  Cardiovascular: Regular rate and rhythm, no murmurs / rubs / gallops. No extremity edema. 2+ pedal pulses. No carotid bruits.   Abdomen: no tenderness, no masses palpated. No hepatosplenomegaly. Bowel sounds positive.  Musculoskeletal: no clubbing / cyanosis. No joint deformity upper and lower extremities. Good ROM, no contractures. Normal muscle tone.  Skin: no rashes, lesions, ulcers. No induration Neurologic: CN 2-12 grossly intact. Sensation intact, DTR normal. Strength 5/5 x all 4 extremities.  Psychiatric: Normal judgment and insight. Alert and oriented x 3. Normal mood.   Data Reviewed:  UC E coli  Family Communication: None  Disposition: Status is: Inpatient Remains inpatient appropriate because: Ongoing recovery from UTI, sepsis  Planned Discharge Destination: Skilled nursing facility    Time spent: 35 minutes  Author: Nena Rebel, MD 12/05/2023 2:52 PM  For on call review [email or web address].

## 2023-12-05 NOTE — TOC Progression Note (Signed)
 Transition of Care [facility]) - Progression Note    Patient Details  Name: Ruth Mayo MRN: [id number] Date of Birth: Sep 30, 1960  [facility] CM/SW Contact  Marinda Cooks, RN Phone Number: 12/05/2023, 2:52 PM  Clinical Narrative:     This CM confirmed with admission liaison Massie pt is a LTC resident at [facility] and can return when medially cleared. Pt remains medically unstable to dc today . TOC will cont to follow dc planning / care coordination and update as applicable.                    Expected Discharge Plan and Services                                               Social Drivers of Health (SDOH) Interventions SDOH Screenings   Food Insecurity: No Food Insecurity (12/01/2023)  Housing: Low Risk  (12/01/2023)  Transportation Needs: No Transportation Needs (12/01/2023)  Utilities: Not At Risk (12/01/2023)  Financial Resource Strain: Patient Unable To Answer (03/04/2023)   Received from [facility]  Physical Activity: Insufficiently Active (07/23/2022)   Received from [facility]  Social Connections: Patient Unable To Answer (03/04/2023)   Received from [facility]  Stress: No Stress Concern Present (03/02/2023)   Received from [facility]  Recent Concern: Stress - Stress Concern Present (01/14/2023)   Received from [facility]  Tobacco Use: Medium Risk (12/01/2023)    Readmission Risk Interventions     No data to display

## 2023-12-05 NOTE — Plan of Care (Signed)

## 2023-12-05 NOTE — Progress Notes (Signed)
   12/05/23 0800  Spiritual Encounters  Type of Visit Initial  Care provided to: Patient  Referral source Family  Reason for visit Routine spiritual support  OnCall Visit Yes   Chaplain responded to consult. Chaplain established a relationship with patient and informed patient of available resources. Chaplain provided a compassionate presence and reflective listening as patient spoke about being in the hospital. Patient shared about her family and circle of support. Chaplain is available for follow up as requested.

## 2023-12-06 ENCOUNTER — Encounter: Payer: Self-pay | Admitting: Internal Medicine

## 2023-12-06 DIAGNOSIS — M4628 Osteomyelitis of vertebra, sacral and sacrococcygeal region: Secondary | ICD-10-CM | POA: Diagnosis not present

## 2023-12-06 LAB — CBC
HCT: 30.5 % — ABNORMAL LOW (ref 36.0–46.0)
Hemoglobin: 9.3 g/dL — ABNORMAL LOW (ref 12.0–15.0)
MCH: 28.4 pg (ref 26.0–34.0)
MCHC: 30.5 g/dL (ref 30.0–36.0)
MCV: 93 fL (ref 80.0–100.0)
Platelets: 297 K/uL (ref 150–400)
RBC: 3.28 MIL/uL — ABNORMAL LOW (ref 3.87–5.11)
RDW: 13.8 % (ref 11.5–15.5)
WBC: 5.5 K/uL (ref 4.0–10.5)
nRBC: 0 % (ref 0.0–0.2)

## 2023-12-06 LAB — BASIC METABOLIC PANEL WITH GFR
Anion gap: 6 (ref 5–15)
BUN: 7 mg/dL (ref 6–20)
CO2: 23 mmol/L (ref 22–32)
Calcium: 9.4 mg/dL (ref 8.9–10.3)
Chloride: 107 mmol/L (ref 98–111)
Creatinine, Ser: <0.3 mg/dL — ABNORMAL LOW (ref 0.44–1.00)
Glucose, Bld: 90 mg/dL (ref 70–99)
Potassium: 3.1 mmol/L — ABNORMAL LOW (ref 3.5–5.1)
Sodium: 136 mmol/L (ref 135–145)

## 2023-12-06 LAB — GLUCOSE, CAPILLARY
Glucose-Capillary: 80 mg/dL (ref 70–99)
Glucose-Capillary: 93 mg/dL (ref 70–99)
Glucose-Capillary: 86 mg/dL (ref 70–99)
Glucose-Capillary: 83 mg/dL (ref 70–99)

## 2023-12-06 LAB — MAGNESIUM: Magnesium: 1.8 mg/dL (ref 1.7–2.4)

## 2023-12-06 LAB — PHOSPHORUS: Phosphorus: 1.7 mg/dL — ABNORMAL LOW (ref 2.5–4.6)

## 2023-12-06 MED ORDER — POTASSIUM CHLORIDE 10 MEQ/100ML IV SOLN
10.0000 meq | INTRAVENOUS | Status: AC
  Administered 2023-12-06 (×3): 10 meq via INTRAVENOUS
  Filled 2023-12-06 (×3): qty 100

## 2023-12-06 MED ORDER — POTASSIUM CHLORIDE 10 MEQ/100ML IV SOLN
10.0000 meq | Freq: Once | INTRAVENOUS | Status: AC
  Administered 2023-12-06: 10 meq via INTRAVENOUS
  Filled 2023-12-06: qty 100

## 2023-12-06 MED ORDER — POTASSIUM CHLORIDE 10 MEQ/100ML IV SOLN
10.0000 meq | INTRAVENOUS | Status: AC
  Administered 2023-12-06 (×2): 10 meq via INTRAVENOUS
  Filled 2023-12-06 (×2): qty 100

## 2023-12-06 MED ORDER — TRAMADOL HCL 50 MG PO TABS
50.0000 mg | ORAL_TABLET | Freq: Four times a day (QID) | ORAL | Status: DC | PRN
  Administered 2023-12-06 – 2023-12-11 (×15): 50 mg via ORAL
  Filled 2023-12-06 (×16): qty 1

## 2023-12-06 NOTE — Plan of Care (Signed)
  Problem: Education: Goal: Knowledge of General Education information will improve Description: Including pain rating scale, medication(s)/side effects and non-pharmacologic comfort measures Outcome: Progressing   Problem: Clinical Measurements: Goal: Ability to maintain clinical measurements within normal limits will improve Outcome: Progressing Goal: Will remain free from infection Outcome: Progressing Goal: Diagnostic test results will improve Outcome: Progressing Goal: Respiratory complications will improve Outcome: Progressing Goal: Cardiovascular complication will be avoided Outcome: Progressing   Problem: Activity: Goal: Risk for activity intolerance will decrease Outcome: Progressing   Problem: Nutrition: Goal: Adequate nutrition will be maintained Outcome: Progressing   Problem: Coping: Goal: Level of anxiety will decrease Outcome: Progressing   Problem: Elimination: Goal: Will not experience complications related to bowel motility Outcome: Progressing Goal: Will not experience complications related to urinary retention Outcome: Progressing   Problem: Pain Managment: Goal: General experience of comfort will improve and/or be controlled Outcome: Progressing   Problem: Safety: Goal: Ability to remain free from injury will improve Outcome: Progressing   Problem: Skin Integrity: Goal: Risk for impaired skin integrity will decrease Outcome: Progressing   Problem: Coping: Goal: Ability to adjust to condition or change in health will improve Outcome: Progressing   Problem: Fluid Volume: Goal: Ability to maintain a balanced intake and output will improve Outcome: Progressing   Problem: Health Behavior/Discharge Planning: Goal: Ability to identify and utilize available resources and services will improve Outcome: Progressing Goal: Ability to manage health-related needs will improve Outcome: Progressing   Problem: Metabolic: Goal: Ability to maintain  appropriate glucose levels will improve Outcome: Progressing   Problem: Nutritional: Goal: Maintenance of adequate nutrition will improve Outcome: Progressing Goal: Progress toward achieving an optimal weight will improve Outcome: Progressing   Problem: Skin Integrity: Goal: Risk for impaired skin integrity will decrease Outcome: Progressing   Problem: Tissue Perfusion: Goal: Adequacy of tissue perfusion will improve Outcome: Progressing

## 2023-12-06 NOTE — Progress Notes (Signed)
 Progress Note   [name] DOB: 1960/05/10 DOA: 12/01/2023     5 DOS: the patient was seen and examined on 12/06/2023   Brief hospital course: Ruth Mayo is a 63 y.o. 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   07/28: to ED. Concern for worsening sacral ulcer w/ osteomyelitis (see A/P for CT report). Admitted for sepsis d/t this as well as UTI, CT also concerning for possible aspiration pneumonia/pneumonitis.  07/29: per general surgery does not need debridement. ID advised no to antibiotics but wound vac Continue daily dressing 7/31: Patient had low BP and night on-call was called. BP is soft but no symptoms. Urine our put is good. 8/1: Medically ready for discharge to skilled 8/2: Waiting for case manager to arrange discharge to nursing home  Assessment and Plan:  Sacral osteomyelitis  CT showed enlarged sacral wound with osteomyelitis Clinically not septic. Elevated Sed rate 43, elevated CRP 8.5 Increasing size and depth of the sacral decubitus ulcer, measuring 5 cm deep and extending 11.5 cm in the craniocaudal extent. Progressive bony destruction of the lower sacrum, consistent with osteomyelitis. Along the right lateral aspect in the medial right gluteal musculature, there is a small peripherally enhancing gas and fluid collection measuring 1.7 x 1.6 x 1.8 cm. This could reflect an intramuscular abscess or gas and fluid trapped within a lateral extension of the decubitus ulcer. Empiric antimicrobial  treatment with vancomycin  and meropenem  (patient received 1 dose of Rocephin  in ED) Pain control Blood cultures x 2  ESR and CRP wound care consult General surgery consult - no need for debridement  Infectious disease consult - no need for IV or PO abx 7/30, only advised for wound care, VAC     Complicated UTI (urinary tract infection) Presence of indwelling Foley catheter, likely associated with Foley catheter Multiple small nonobstructive bilateral renal calculi.  No hydronephrosis  Foley catheter was changed in ED On Augmentin  for total of 7 days Urine culture grew pan sensitive E.coli  Consider urology follow up as outpatient   CAD (coronary artery disease) HTN HLD s/p CABG. No CP On statin, , ACE,    Hypothyroidism Recent TSH 10/05/23 was >20,  Hypothyroid could certainly contribute to altered mental status Normal TSH at 3.529 Continue with Synthroid    Type 2 diabetes mellitus with peripheral neuropathy (HCC) Glargine insulin  80 units daily SSI   Hypercalcemia This is chronic issue.  Calcium  11.1 on admission. 9.4 8/2 Checked intact, PTH related protein, 1,25-hydroxy vitamin D, all WNL   Hypokalemia and Hypophosphatemia:  Potassium 3.1, phosphorus 1.7, magnesium  normal 2.2 Repleted potassium and phosphorus Monitor labs    Depression with anxiety Continue home Zoloft     Hypotension: 7/31 S/p IVF bolus Patient asymptomatic, may be related to Morphine  or UTI D/C IV Morphine  On Augmentin  and Midodrine  Continue to monitor BP             Subjective: No new complaints  Physical Exam: Vitals:   12/05/23 2016 12/06/23 0353 12/06/23 0747 12/06/23 0751  BP: (!) 109/57 (!) 92/46 (!) 86/40 (!) 90/53  Pulse: 76 73 83 85  Resp: 18 18 17    Temp: 97.7  F (36.5 C) (!) 97.1 F (36.2 C) 98.2 F (36.8 C)   TempSrc: Oral  Oral   SpO2: 98% 100% 98%   Weight:      Height:       Constitutional: Alert, awake, calm, comfortable HEENT: Neck supple Respiratory:  clear to auscultation bilaterally, no wheezing, no crackles. Normal respiratory effort. No accessory muscle use.  Cardiovascular: Regular rate and rhythm, no murmurs / rubs / gallops. No extremity edema. 2+ pedal pulses. No carotid bruits.  Abdomen: no tenderness, no masses palpated. No hepatosplenomegaly. Bowel sounds positive.  Musculoskeletal: no clubbing / cyanosis. No joint deformity upper and lower extremities. Good ROM, no contractures. Normal muscle tone.  Skin: no rashes, lesions, ulcers. No induration Neurologic: CN 2-12 grossly intact. Sensation intact, DTR normal. Strength 5/5 x all 4 extremities.  Psychiatric: Normal judgment and insight. Alert and oriented x 3. Normal mood.   Data Reviewed:  K 3.1  Family Communication: None available  Disposition: Status is: Inpatient Remains inpatient appropriate because: Patient could go  to skilled nursing facility, case manager is aware  Planned Discharge Destination: Skilled nursing facility    Time spent: 35 minutes  Author: Nena Rebel, MD 12/06/2023 9:57 AM  For on call review [email or web address].

## 2023-12-06 NOTE — Plan of Care (Signed)
  Problem: Education: Goal: Knowledge of General Education information will improve Description: Including pain rating scale, medication(s)/side effects and non-pharmacologic comfort measures Outcome: Progressing   Problem: Clinical Measurements: Goal: Ability to maintain clinical measurements within normal limits will improve Outcome: Progressing Goal: Diagnostic test results will improve Outcome: Progressing Goal: Respiratory complications will improve Outcome: Progressing Goal: Cardiovascular complication will be avoided Outcome: Progressing   Problem: Nutrition: Goal: Adequate nutrition will be maintained Outcome: Progressing   Problem: Coping: Goal: Level of anxiety will decrease Outcome: Progressing   Problem: Elimination: Goal: Will not experience complications related to bowel motility Outcome: Progressing Goal: Will not experience complications related to urinary retention Outcome: Progressing   Problem: Pain Managment: Goal: General experience of comfort will improve and/or be controlled Outcome: Progressing   Problem: Safety: Goal: Ability to remain free from injury will improve Outcome: Progressing   Problem: Education: Goal: Ability to describe self-care measures that may prevent or decrease complications (Diabetes Survival Skills Education) will improve Outcome: Progressing Goal: Individualized Educational Video(s) Outcome: Progressing   Problem: Coping: Goal: Ability to adjust to condition or change in health will improve Outcome: Progressing   Problem: Fluid Volume: Goal: Ability to maintain a balanced intake and output will improve Outcome: Progressing   Problem: Health Behavior/Discharge Planning: Goal: Ability to identify and utilize available resources and services will improve Outcome: Progressing Goal: Ability to manage health-related needs will improve Outcome: Progressing   Problem: Metabolic: Goal: Ability to maintain appropriate  glucose levels will improve Outcome: Progressing   Problem: Nutritional: Goal: Maintenance of adequate nutrition will improve Outcome: Progressing Goal: Progress toward achieving an optimal weight will improve Outcome: Progressing   Problem: Tissue Perfusion: Goal: Adequacy of tissue perfusion will improve Outcome: Progressing   Problem: Health Behavior/Discharge Planning: Goal: Ability to manage health-related needs will improve Outcome: Not Progressing   Problem: Clinical Measurements: Goal: Will remain free from infection Outcome: Not Progressing   Problem: Activity: Goal: Risk for activity intolerance will decrease Outcome: Not Progressing   Problem: Skin Integrity: Goal: Risk for impaired skin integrity will decrease Outcome: Not Progressing   Problem: Skin Integrity: Goal: Risk for impaired skin integrity will decrease Outcome: Not Progressing

## 2023-12-06 NOTE — Plan of Care (Signed)

## 2023-12-07 DIAGNOSIS — M4628 Osteomyelitis of vertebra, sacral and sacrococcygeal region: Secondary | ICD-10-CM | POA: Diagnosis not present

## 2023-12-07 DIAGNOSIS — N390 Urinary tract infection, site not specified: Secondary | ICD-10-CM | POA: Diagnosis not present

## 2023-12-07 DIAGNOSIS — L89154 Pressure ulcer of sacral region, stage 4: Secondary | ICD-10-CM | POA: Diagnosis not present

## 2023-12-07 LAB — MAGNESIUM: Magnesium: 1.7 mg/dL (ref 1.7–2.4)

## 2023-12-07 LAB — BASIC METABOLIC PANEL WITH GFR
Anion gap: 4 — ABNORMAL LOW (ref 5–15)
Anion gap: 5 (ref 5–15)
BUN: 6 mg/dL (ref 6–20)
BUN: 7 mg/dL (ref 6–20)
CO2: 22 mmol/L (ref 22–32)
CO2: 23 mmol/L (ref 22–32)
Calcium: 9.2 mg/dL (ref 8.9–10.3)
Calcium: 9.1 mg/dL (ref 8.9–10.3)
Chloride: 110 mmol/L (ref 98–111)
Chloride: 107 mmol/L (ref 98–111)
Creatinine, Ser: 0.32 mg/dL — ABNORMAL LOW (ref 0.44–1.00)
Creatinine, Ser: <0.3 mg/dL — ABNORMAL LOW (ref 0.44–1.00)
GFR, Estimated: >60 mL/min (ref >60)
Glucose, Bld: 71 mg/dL (ref 70–99)
Glucose, Bld: 123 mg/dL — ABNORMAL HIGH (ref 70–99)
Potassium: 4 mmol/L (ref 3.5–5.1)
Potassium: 3.9 mmol/L (ref 3.5–5.1)
Sodium: 136 mmol/L (ref 135–145)
Sodium: 135 mmol/L (ref 135–145)

## 2023-12-07 LAB — AEROBIC CULTURE W GRAM STAIN (SUPERFICIAL SPECIMEN)

## 2023-12-07 LAB — CULTURE, BLOOD (ROUTINE X 2)
Culture: NO GROWTH
Culture: NO GROWTH
Special Requests: ADEQUATE
Special Requests: ADEQUATE

## 2023-12-07 LAB — GLUCOSE, CAPILLARY
Glucose-Capillary: 79 mg/dL (ref 70–99)
Glucose-Capillary: 111 mg/dL — ABNORMAL HIGH (ref 70–99)
Glucose-Capillary: 122 mg/dL — ABNORMAL HIGH (ref 70–99)
Glucose-Capillary: 106 mg/dL — ABNORMAL HIGH (ref 70–99)
Glucose-Capillary: 94 mg/dL (ref 70–99)

## 2023-12-07 NOTE — Plan of Care (Signed)

## 2023-12-07 NOTE — Progress Notes (Signed)
 Progress Note   [name] DOB: 1961/01/09 DOA: 12/01/2023     6 DOS: the patient was seen and examined on 12/07/2023   Brief hospital course: Ruth Mayo is a 63 y.o. 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   07/28: to ED. Concern for worsening sacral ulcer w/ osteomyelitis (see A/P for CT report). Admitted for sepsis d/t this as well as UTI, CT also concerning for possible aspiration pneumonia/pneumonitis.  07/29: per general surgery does not need debridement. ID advised no to antibiotics but wound vac Continue daily dressing 7/31: Patient had low BP and night on-call was called. BP is soft but no symptoms. Urine our put is good. 8/1: Medically ready for discharge to skilled 8/2: Waiting for case manager to arrange discharge to nursing home   Principal Problem:   Sacral osteomyelitis (HCC) Active Problems:   Complicated UTI (urinary tract infection)   CAD (coronary artery disease)   HTN (hypertension)   HLD (hyperlipidemia)   Hypothyroidism   Type 2 diabetes mellitus with peripheral neuropathy (HCC)   Hypercalcemia   Hypokalemia   Hypophosphatemia   Presence of indwelling Foley catheter   Depression with anxiety   Obesity (BMI 30.0-34.9)   Sacral decubitus ulcer, stage IV (HCC)   Assessment and Plan: Sacral osteomyelitis with stage IV pressure ulcer. CT showed enlarged sacral wound with osteomyelitis Clinically not septic. Seen by ID, patient condition not consistent with acute osteomyelitis, no  need for antibiotics. Wound VAC is placed, condition stable.     Complicated UTI (urinary tract infection) secondary to E. coli which was caused by indwelling Foley catheter. Multiple small nonobstructive bilateral renal calculi.  No hydronephrosis  Urine culture grew E. coli pansensitive, has completed 5 days of antibiotics.   CAD (coronary artery disease) HTN HLD s/p CABG. No CP On statin, , ACE,    Hypothyroidism Continue Synthroid .   Type 2 diabetes mellitus with peripheral neuropathy (HCC) Glucose not elevated, currently only on sliding scale insulin .   Hypercalcemia This is chronic issue.  Calcium  11.1 on admission. 9.4 8/2 Checked intact, PTH related protein, 1,25-hydroxy vitamin D, all WNL Calcium  since has normalized.   Hypokalemia and Hypophosphatemia:  Potassium 3.1, phosphorus 1.7, magnesium  normal 2.2 Repleted potassium and phosphorus Check levels tomorrow.   Depression with anxiety Continue home Zoloft     Hypotension: 7/31 S/p IVF bolus Patient asymptomatic, may be related to Morphine  or UTI Blood pressure has been better, still on midodrine  5 mg twice a day.  Continue to follow       Subjective:  Patient doing well today, no complaints.  Physical Exam: Vitals:   12/07/23 0400 12/07/23 0408 12/07/23 0513 12/07/23 0755  BP:  (!) 97/50  (!) 103/57  Pulse:  83  84  Resp: 16 18 15 16   Temp:  98 F (36.7 C)  97.8 F (36.6 C)  TempSrc:      SpO2:  98%  99%  Weight:      Height:       General exam: Appears calm and comfortable  Respiratory system: Clear to auscultation. Respiratory effort normal. Cardiovascular system: S1 & S2 heard, RRR. No JVD, murmurs,  rubs, gallops or clicks. No pedal edema. Gastrointestinal system: Abdomen is nondistended, soft and nontender. No organomegaly or masses felt. Normal bowel sounds heard. Central nervous system: Alert and oriented x3. No focal neurological deficits. Extremities: Symmetric 5 x 5 power. Skin: No  rashes, lesions or ulcers Psychiatry:  Mood & affect appropriate.    Data Reviewed:  Lab results reviewed.  Family Communication: Husband at the bedside.  Disposition: Status is: Inpatient Remains inpatient appropriate because: Severity of disease,  Patient has concerns going back to to her nursing home, TOC notified.     Time spent: 35 minutes  Author: Murvin Mana, MD 12/07/2023 1:47 PM  For on call review [email or web address].

## 2023-12-07 NOTE — Evaluation (Signed)
 Physical Therapy Evaluation Patient Details Name: Neida Ellegood MRN: [id number] DOB: [birth year] Today's Date: 12/07/2023  History of Present Illness  Kemari Mares is a 63 y.o. female with medical history significant of CAD, s/p of CABG, bedbound, chronic indwelling Foley catheter, hypertension, hyperlipidemia, diabetes mellitus, hypothyroidism, depression with anxiety, obesity, anemia, chronic close anemia, s/p feeding tube placement (only use it for taking medications patient), sacral wound with osteomyelitis, who presents with weakness.   Clinical Impression  Patient received in bed, she has been basically bed bound for some time now. Reports she has been able to sit edge of bed with assistance briefly. Patient has wound on sacrum and this also limits tolerance, ability to mobilize. Patient appears to be at her baseline level of function. She will benefit from bed exercises and rolling as tolerated. Will recommend mobility specialists continue to follow her for that. Will sign off at this time.          If plan is discharge home, recommend the following: Two people to help with bathing/dressing/bathroom   Can travel by private vehicle   No    Equipment Recommendations None recommended by PT  Recommendations for Other Services       Functional Status Assessment Patient has not had a recent decline in their functional status     Precautions / Restrictions Precautions Precautions: None Recall of Precautions/Restrictions: Intact Restrictions Weight Bearing Restrictions Per Provider Order: No      Mobility  Bed Mobility Overal bed mobility: Needs Assistance Bed Mobility: Rolling Rolling: +2 for physical assistance, Max assist, Used rails         General bed mobility comments: patient limited by pain, unable to attempt sitting edge of bed after rolling to get cleaned up    Transfers                   General transfer comment: has not been able to stand recently     Ambulation/Gait               General Gait Details: non ambulatory  Stairs            Wheelchair Mobility     Tilt Bed    Modified Rankin (Stroke Patients Only)       Balance Overall balance assessment: Needs assistance                                           Pertinent Vitals/Pain Pain Assessment Pain Assessment: Faces Faces Pain Scale: Hurts whole lot Pain Location: bottom Pain Descriptors / Indicators: Discomfort, Grimacing Pain Intervention(s): Monitored during session, Repositioned    Home Living Family/patient expects to be discharged to:: Skilled nursing facility                        Prior Function               Mobility Comments: patient has been living in facility for a while. Has not stood recently. Only has been able to sit up on edge of bed with assistance for 1 minute per her report. ADLs Comments: requires assistance from staff for all care     Extremity/Trunk Assessment   Upper Extremity Assessment Upper Extremity Assessment: Defer to OT evaluation    Lower Extremity Assessment Lower Extremity Assessment: RLE deficits/detail;LLE deficits/detail RLE Deficits / Details: limited to no movement  of R LE RLE Coordination: decreased gross motor LLE Deficits / Details: can point/flex foot LLE Coordination: decreased gross motor    Cervical / Trunk Assessment Cervical / Trunk Assessment: Normal  Communication   Communication Communication: No apparent difficulties    Cognition Arousal: Alert Behavior During Therapy: WFL for tasks assessed/performed   PT - Cognitive impairments: No apparent impairments                         Following commands: Intact       Cueing Cueing Techniques: Verbal cues     General Comments      Exercises     Assessment/Plan    PT Assessment All further PT needs can be met in the next venue of care  PT Problem List Decreased strength;Decreased  activity tolerance;Decreased mobility;Pain;Decreased skin integrity;Decreased range of motion       PT Treatment Interventions      PT Goals (Current goals can be found in the Care Plan section)  Acute Rehab PT Goals Patient Stated Goal: to improve pain PT Goal Formulation: With patient Time For Goal Achievement: 12/21/23 Potential to Achieve Goals: Fair    Frequency       Co-evaluation PT/OT/SLP Co-Evaluation/Treatment: Yes Reason for Co-Treatment: For patient/therapist safety;To address functional/ADL transfers PT goals addressed during session: Mobility/safety with mobility         AM-PAC PT 6 Clicks Mobility  Outcome Measure Help needed turning from your back to your side while in a flat bed without using bedrails?: Total Help needed moving from lying on your back to sitting on the side of a flat bed without using bedrails?: Total Help needed moving to and from a bed to a chair (including a wheelchair)?: Total Help needed standing up from a chair using your arms (e.g., wheelchair or bedside chair)?: Total Help needed to walk in hospital room?: Total Help needed climbing 3-5 steps with a railing? : Total 6 Click Score: 6    End of Session   Activity Tolerance: Patient limited by pain Patient left: in bed;with call bell/phone within reach Nurse Communication: Mobility status PT Visit Diagnosis: Other abnormalities of gait and mobility (R26.89);Pain;Muscle weakness (generalized) (M62.81) Pain - part of body:  (sacrum)    Time: 1210-1226 PT Time Calculation (min) (ACUTE ONLY): 16 min   Charges:   PT Evaluation $PT Eval Low Complexity: 1 Low   PT General Charges $$ ACUTE PT VISIT: 1 Visit         Kavari Parrillo, PT, GCS 12/07/23,12:39 PM

## 2023-12-07 NOTE — Evaluation (Signed)
 Occupational Therapy Evaluation Patient Details Name: Ruth Mayo MRN: [id number] DOB: 1961/01/22 Today's Date: 12/07/2023   History of Present Illness   Ruth Mayo is a 63 y.o. female with medical history significant of CAD, s/p of CABG, bedbound, chronic indwelling Foley catheter, hypertension, hyperlipidemia, diabetes mellitus, hypothyroidism, depression with anxiety, obesity, anemia, chronic close anemia, s/p feeding tube placement (only use it for taking medications patient), sacral wound with osteomyelitis, who presents with weakness.     Clinical Impressions Upon entering the room, pt supine in bed and seen for skilled co- evaluation for pt and therapist safety. Pt reports being at a LTC facility and moving to a new one [employer]). Pt needs assistance for all care from bed level at baseline and has foley. Pt rolls L <> R with max A of 2 this session. Pt found to have been incontinent of BM and needing total A for hygiene while second person assisted with rolling. Pt is currently at functional baseline and has no skilled acute OT needs at this time. OT to complete order.       Functional Status Assessment   Patient has not had a recent decline in their functional status     Equipment Recommendations   None recommended by OT      Precautions/Restrictions   Precautions Precautions: None Recall of Precautions/Restrictions: Intact     Mobility Bed Mobility Overal bed mobility: Needs Assistance Bed Mobility: Rolling Rolling: +2 for physical assistance, Max assist, Used rails         General bed mobility comments: patient limited by pain, unable to attempt sitting edge of bed after rolling to get cleaned up    Transfers                   General transfer comment: has not been able to stand recently          ADL either performed or assessed with clinical judgement   ADL Overall ADL's : At baseline                                        General ADL Comments: total care and has foley     Vision Patient Visual Report: No change from baseline              Pertinent Vitals/Pain Pain Assessment Pain Assessment: Faces Faces Pain Scale: Hurts whole lot Pain Location: bottom Pain Descriptors / Indicators: Discomfort, Grimacing Pain Intervention(s): Monitored during session, Repositioned     Extremity/Trunk Assessment Upper Extremity Assessment Upper Extremity Assessment: Generalized weakness;LUE deficits/detail;RUE deficits/detail RUE Deficits / Details: decreased shoulder elevation to 90 degrees at baseline LUE Deficits / Details: decreased shoulder elevation ~ 60 degrees (baseline for many years per pt.   Lower Extremity Assessment Lower Extremity Assessment: RLE deficits/detail;LLE deficits/detail RLE Deficits / Details: limited to no movement of R LE RLE Coordination: decreased gross motor LLE Deficits / Details: can point/flex foot LLE Coordination: decreased gross motor   Cervical / Trunk Assessment Cervical / Trunk Assessment: Normal   Communication Communication Communication: No apparent difficulties   Cognition Arousal: Alert Behavior During Therapy: WFL for tasks assessed/performed Cognition: No apparent impairments                               Following commands: Intact       Cueing  General Comments   Cueing Techniques: Verbal cues              Home Living Family/patient expects to be discharged to:: Skilled nursing facility                                        Prior Functioning/Environment               Mobility Comments: patient has been living in facility for a while. Has not stood recently. Only has been able to sit up on edge of bed with assistance for 1 minute per her report. ADLs Comments: requires assistance from staff for all care at bed level                 Co-evaluation PT/OT/SLP Co-Evaluation/Treatment:  Yes Reason for Co-Treatment: For patient/therapist safety;To address functional/ADL transfers PT goals addressed during session: Mobility/safety with mobility OT goals addressed during session: ADL's and self-care      AM-PAC OT 6 Clicks Daily Activity     Outcome Measure Help from another person eating meals?: A Little Help from another person taking care of personal grooming?: A Little Help from another person toileting, which includes using toliet, bedpan, or urinal?: Total Help from another person bathing (including washing, rinsing, drying)?: Total Help from another person to put on and taking off regular upper body clothing?: Total Help from another person to put on and taking off regular lower body clothing?: Total 6 Click Score: 10   End of Session Nurse Communication: Mobility status  Activity Tolerance: Patient limited by fatigue;Patient limited by pain Patient left: in bed                   Time: 1210-1227 OT Time Calculation (min): 17 min Charges:  OT General Charges $OT Visit: 1 Visit OT Evaluation $OT Eval Moderate Complexity: 1 [address], [place], OTR/L , CBIS ascom [phone]  12/07/23, 12:45 PM

## 2023-12-08 DIAGNOSIS — M4628 Osteomyelitis of vertebra, sacral and sacrococcygeal region: Secondary | ICD-10-CM | POA: Diagnosis not present

## 2023-12-08 DIAGNOSIS — L89154 Pressure ulcer of sacral region, stage 4: Secondary | ICD-10-CM | POA: Diagnosis not present

## 2023-12-08 DIAGNOSIS — N390 Urinary tract infection, site not specified: Secondary | ICD-10-CM | POA: Diagnosis not present

## 2023-12-08 LAB — MAGNESIUM: Magnesium: 1.7 mg/dL (ref 1.7–2.4)

## 2023-12-08 LAB — PHOSPHORUS: Phosphorus: 2.2 mg/dL — ABNORMAL LOW (ref 2.5–4.6)

## 2023-12-08 LAB — GLUCOSE, CAPILLARY
Glucose-Capillary: 92 mg/dL (ref 70–99)
Glucose-Capillary: 133 mg/dL — ABNORMAL HIGH (ref 70–99)
Glucose-Capillary: 111 mg/dL — ABNORMAL HIGH (ref 70–99)
Glucose-Capillary: 123 mg/dL — ABNORMAL HIGH (ref 70–99)

## 2023-12-08 MED ORDER — K PHOS MONO-SOD PHOS DI & MONO 155-852-130 MG PO TABS
500.0000 mg | ORAL_TABLET | Freq: Once | ORAL | Status: AC
  Administered 2023-12-08: 500 mg via ORAL
  Filled 2023-12-08: qty 2

## 2023-12-08 NOTE — TOC Progression Note (Signed)
 Transition of Care [facility]) - Progression Note    Patient Details  Name: Florette Thai MRN: [id number] Date of Birth: [birth year]  [facility] CM/SW Contact  Alvaro Louder, [place] Phone Number: 12/08/2023, 3:20 PM  Clinical Narrative:   LCSWA spoke with spouse over the phone. Spouse indicated that he does not want her to go back to [facility]. LCSWA explained FL2 information being sent out in [place]. Spouse indicated that he wants her information sent out to other SNF's around [place], [place], and [facility]. LCSWA educated on Long term Care placement. LCSWA awaiting responses from facilities and will update Spouse on options.   LCSWA indicated to spouse that when she is medically ready she will go back to [facility] and will have to start the process of finding another LTC there.   TOC to follow for discharge.                       Expected Discharge Plan and Services                                               Social Drivers of Health (SDOH) Interventions SDOH Screenings   Food Insecurity: No Food Insecurity (12/01/2023)  Housing: Low Risk  (12/01/2023)  Transportation Needs: No Transportation Needs (12/01/2023)  Utilities: Not At Risk (12/01/2023)  Financial Resource Strain: Patient Unable To Answer (03/04/2023)   Received from [facility]  Physical Activity: Insufficiently Active (07/23/2022)   Received from [facility]  Social Connections: Patient Unable To Answer (03/04/2023)   Received from [facility]  Stress: No Stress Concern Present (03/02/2023)   Received from [facility]  Recent Concern: Stress - Stress Concern Present (01/14/2023)   Received from [facility]  Tobacco Use: Medium Risk (12/01/2023)    Readmission Risk Interventions     No data to display

## 2023-12-08 NOTE — Progress Notes (Signed)
 Progress Note   [name] DOB: 1960/11/30 DOA: 12/01/2023     7 DOS: the patient was seen and examined on 12/08/2023   Brief hospital course: Ruth Mayo is a 63 y.o. 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   07/28: to ED. Concern for worsening sacral ulcer w/ osteomyelitis (see A/P for CT report). Admitted for sepsis d/t this as well as UTI, CT also concerning for possible aspiration pneumonia/pneumonitis.  07/29: per general surgery does not need debridement. ID advised no to antibiotics but wound vac Continue daily dressing 7/31: Patient had low BP and night on-call was called. BP is soft but no symptoms. Urine our put is good. 8/1: Medically ready for discharge to skilled 8/2: Waiting for case manager to arrange discharge to nursing home   Principal Problem:   Sacral osteomyelitis (HCC) Active Problems:   Complicated UTI (urinary tract infection)   CAD (coronary artery disease)   HTN (hypertension)   HLD (hyperlipidemia)   Hypothyroidism   Type 2 diabetes mellitus with peripheral neuropathy (HCC)   Hypercalcemia   Hypokalemia   Hypophosphatemia   Presence of indwelling Foley catheter   Depression with anxiety   Obesity (BMI 30.0-34.9)   Sacral decubitus ulcer, stage IV (HCC)   Assessment and Plan: Sacral osteomyelitis with stage IV pressure ulcer. CT showed enlarged sacral wound with osteomyelitis Clinically not septic. Seen by ID, patient condition not consistent with acute osteomyelitis, no  need for antibiotics. Wound VAC is placed, condition stable.     Complicated UTI (urinary tract infection) secondary to E. coli which was caused by indwelling Foley catheter. Multiple small nonobstructive bilateral renal calculi.  No hydronephrosis  Urine culture grew E. coli pansensitive, has completed 5 days of antibiotics.   CAD (coronary artery disease) HTN HLD s/p CABG. No CP On statin, , ACE,    Hypothyroidism Continue Synthroid .   Type 2 diabetes mellitus with peripheral neuropathy (HCC) Glucose not elevated, currently only on sliding scale insulin .   Hypercalcemia This is chronic issue.  Calcium  11.1 on admission. 9.4 8/2 Checked intact, PTH related protein, 1,25-hydroxy vitamin D, all WNL Calcium  since has normalized.   Hypokalemia and Hypophosphatemia:  Potassium 3.1, phosphorus 1.7, magnesium  normal 2.2 Repleted potassium and phosphorus Phosphate still low, give higher dose of potassium phosphate .  Recheck tomorrow.   Depression with anxiety Continue home Zoloft     Hypotension: 7/31 S/p IVF bolus Patient asymptomatic, may be related to Morphine  or UTI Discontinue midodrine .       Subjective:  Patient has no complaint today.  Physical Exam: Vitals:   12/07/23 1927 12/07/23 2358 12/08/23 0251 12/08/23 0733  BP: (!) 91/53 99/61 110/62 105/62  Pulse: 78 83 86 87  Resp: 16  16 16   Temp: 98.7 F (37.1 C)  97.8 F (36.6 C) 97.8 F (36.6 C)  TempSrc: Oral  Oral Oral  SpO2: 99%  99% 97%  Weight:      Height:       General exam: Appears calm and comfortable  Respiratory system: Clear to auscultation. Respiratory effort normal. Cardiovascular system: S1 & S2 heard, RRR. No JVD, murmurs, rubs, gallops or clicks. No  pedal edema. Gastrointestinal system: Abdomen is nondistended, soft and nontender. No organomegaly or masses felt. Normal bowel sounds heard. Central nervous system: Alert and oriented. No focal neurological deficits. Extremities: Symmetric  5 x 5 power. Skin: No rashes, lesions or ulcers Psychiatry: Judgement and insight appear normal. Mood & affect appropriate.    Data Reviewed:  Lab results reviewed.  Family Communication: None  Disposition: Status is: Inpatient Remains inpatient appropriate because: Unsafe discharge, patient does not want to return to the previous facility, discussed with TOC, she is working on placement.     Time spent: 35 minutes  Author: Murvin Mana, MD 12/08/2023 12:13 PM  For on call review [email or web address].

## 2023-12-08 NOTE — Plan of Care (Signed)
  Problem: Education: Goal: Knowledge of General Education information will improve Description: Including pain rating scale, medication(s)/side effects and non-pharmacologic comfort measures Outcome: Progressing   Problem: Clinical Measurements: Goal: Ability to maintain clinical measurements within normal limits will improve Outcome: Progressing Goal: Will remain free from infection Outcome: Progressing Goal: Diagnostic test results will improve Outcome: Progressing Goal: Respiratory complications will improve Outcome: Progressing Goal: Cardiovascular complication will be avoided Outcome: Progressing   Problem: Activity: Goal: Risk for activity intolerance will decrease Outcome: Progressing   Problem: Nutrition: Goal: Adequate nutrition will be maintained Outcome: Progressing   Problem: Coping: Goal: Level of anxiety will decrease Outcome: Progressing   Problem: Elimination: Goal: Will not experience complications related to bowel motility Outcome: Progressing Goal: Will not experience complications related to urinary retention Outcome: Progressing   Problem: Pain Managment: Goal: General experience of comfort will improve and/or be controlled Outcome: Progressing   Problem: Safety: Goal: Ability to remain free from injury will improve Outcome: Progressing   Problem: Skin Integrity: Goal: Risk for impaired skin integrity will decrease Outcome: Progressing   Problem: Coping: Goal: Ability to adjust to condition or change in health will improve Outcome: Progressing   Problem: Fluid Volume: Goal: Ability to maintain a balanced intake and output will improve Outcome: Progressing   Problem: Health Behavior/Discharge Planning: Goal: Ability to identify and utilize available resources and services will improve Outcome: Progressing Goal: Ability to manage health-related needs will improve Outcome: Progressing   Problem: Metabolic: Goal: Ability to maintain  appropriate glucose levels will improve Outcome: Progressing   Problem: Nutritional: Goal: Maintenance of adequate nutrition will improve Outcome: Progressing Goal: Progress toward achieving an optimal weight will improve Outcome: Progressing   Problem: Skin Integrity: Goal: Risk for impaired skin integrity will decrease Outcome: Progressing   Problem: Tissue Perfusion: Goal: Adequacy of tissue perfusion will improve Outcome: Progressing

## 2023-12-08 NOTE — Progress Notes (Signed)
 Patient refuses to be turn every 2 hours and prefers laying on her left side. Writer and night nurse on prior shift educated patient on the importance of turning to either side to prevent pressure injuries. Patient states she understands but doesn't wish to turn. MD made aware.

## 2023-12-09 DIAGNOSIS — E876 Hypokalemia: Secondary | ICD-10-CM | POA: Diagnosis not present

## 2023-12-09 DIAGNOSIS — N390 Urinary tract infection, site not specified: Secondary | ICD-10-CM | POA: Diagnosis not present

## 2023-12-09 DIAGNOSIS — M4628 Osteomyelitis of vertebra, sacral and sacrococcygeal region: Secondary | ICD-10-CM | POA: Diagnosis not present

## 2023-12-09 LAB — BASIC METABOLIC PANEL WITH GFR
Anion gap: 4 — ABNORMAL LOW (ref 5–15)
BUN: 8 mg/dL (ref 6–20)
CO2: 23 mmol/L (ref 22–32)
Calcium: 8.7 mg/dL — ABNORMAL LOW (ref 8.9–10.3)
Chloride: 110 mmol/L (ref 98–111)
Creatinine, Ser: 0.32 mg/dL — ABNORMAL LOW (ref 0.44–1.00)
GFR, Estimated: >60 mL/min (ref >60)
Glucose, Bld: 125 mg/dL — ABNORMAL HIGH (ref 70–99)
Potassium: 3.2 mmol/L — ABNORMAL LOW (ref 3.5–5.1)
Sodium: 137 mmol/L (ref 135–145)

## 2023-12-09 LAB — MAGNESIUM: Magnesium: 1.6 mg/dL — ABNORMAL LOW (ref 1.7–2.4)

## 2023-12-09 LAB — GLUCOSE, CAPILLARY
Glucose-Capillary: 116 mg/dL — ABNORMAL HIGH (ref 70–99)
Glucose-Capillary: 119 mg/dL — ABNORMAL HIGH (ref 70–99)
Glucose-Capillary: 152 mg/dL — ABNORMAL HIGH (ref 70–99)
Glucose-Capillary: 118 mg/dL — ABNORMAL HIGH (ref 70–99)

## 2023-12-09 LAB — PHOSPHORUS: Phosphorus: 2.4 mg/dL — ABNORMAL LOW (ref 2.5–4.6)

## 2023-12-09 MED ORDER — MAGNESIUM SULFATE 2 GM/50ML IV SOLN
2.0000 g | Freq: Once | INTRAVENOUS | Status: AC
  Administered 2023-12-09: 2 g via INTRAVENOUS
  Filled 2023-12-09: qty 50

## 2023-12-09 MED ORDER — SODIUM CHLORIDE 0.9 % IV BOLUS
500.0000 mL | Freq: Once | INTRAVENOUS | Status: AC
  Administered 2023-12-09: 500 mL via INTRAVENOUS

## 2023-12-09 MED ORDER — MIDODRINE HCL 5 MG PO TABS
5.0000 mg | ORAL_TABLET | Freq: Three times a day (TID) | ORAL | Status: DC
  Administered 2023-12-09 (×2): 5 mg via ORAL
  Filled 2023-12-09 (×2): qty 1

## 2023-12-09 MED ORDER — K PHOS MONO-SOD PHOS DI & MONO 155-852-130 MG PO TABS
500.0000 mg | ORAL_TABLET | Freq: Once | ORAL | Status: AC
  Administered 2023-12-09: 500 mg via ORAL
  Filled 2023-12-09: qty 2

## 2023-12-09 MED ORDER — MIDODRINE HCL 5 MG PO TABS
10.0000 mg | ORAL_TABLET | Freq: Three times a day (TID) | ORAL | Status: DC
  Administered 2023-12-10 – 2023-12-11 (×5): 10 mg via ORAL
  Filled 2023-12-09 (×5): qty 2

## 2023-12-09 MED ORDER — POTASSIUM CHLORIDE CRYS ER 20 MEQ PO TBCR
40.0000 meq | EXTENDED_RELEASE_TABLET | ORAL | Status: AC
  Administered 2023-12-09 (×3): 40 meq via ORAL
  Filled 2023-12-09 (×3): qty 2

## 2023-12-09 MED ORDER — SODIUM CHLORIDE 0.9 % IV BOLUS
1000.0000 mL | Freq: Once | INTRAVENOUS | Status: AC
  Administered 2023-12-09: 1000 mL via INTRAVENOUS

## 2023-12-09 MED ORDER — MIDODRINE HCL 5 MG PO TABS
5.0000 mg | ORAL_TABLET | Freq: Once | ORAL | Status: AC
  Administered 2023-12-09: 5 mg via ORAL
  Filled 2023-12-09: qty 1

## 2023-12-09 NOTE — Consult Note (Signed)
 WOC Nurse wound follow up Wound type: Stage 4 Pressure Injury Measurement: 6cm x 7cm x 2.5cm  Wound azi:rozjw with palpable bone  Drainage (amount, consistency, odor) serosanguinous in VAC canister Periwound:ICD Irritant contact dermatitis; related to b/b incontinence  Dressing procedure/placement/frequency: Removed old NPWT dressing 2pc black foam  Skin protected to the right hip with VAC drape for foam bridge (1) pc Filled wound with  _1___ piece of black foam  Sealed NPWT dressing at HG Patient tolerated procedure well  Next change will be due Friday 8/8, tenatively no WOC nursing on the [facility] campus Friday will need bedside nursing staff to change if possible  Joandry Slagter [facility], CNS, [employer] [phone]

## 2023-12-09 NOTE — Progress Notes (Signed)
 Pt hypotensive. By previous meds and vitals. Pt bp drops after oxycodone . Percocet  was given at 1720 following bp 92/54. Tylenol  given. Bp checked later manual 86/48. MD notified. Bolus given.

## 2023-12-09 NOTE — Progress Notes (Addendum)
       CROSS COVER NOTE  NAME: [name] MRN: [id number] DOB : Sep 17, 1960    Concern as stated by nurse / staff   Ruth Mayo is a 63 y.o. 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 Patient Blood pressure is 82/47. Midodrine  was given at 1651. please advise.      Pertinent findings on chart review: Last progress note confirms the above.  In addition, patient is currently on 5 mg midodrine  3 times daily   Patient Assessment    12/09/2023    8:43 PM 12/09/2023    8:26 PM 12/09/2023    3:00 PM  Vitals with BMI  Systolic 92 82 110  Diastolic 52 47 52  Pulse 83 82 86     Assessment and  Interventions   Assessment:  Chronic hypotension  Plan: Will increase midodrine  from 5 mg to 10 mg and given extra 5 mg now We will give a fluid bolus for additional BP support X

## 2023-12-09 NOTE — Plan of Care (Signed)
 PT is alert and oriented x 4. Compliant of pain. BP remains soft continuously limiting pain manangement. Bolus given BP remains soft. Tylenol  given and 1 x dose of tramadol  due to oxy has been dropping bp. Pt continues to refuse to turn and wound vac in place for sacral wound. Pt educated and still refuses.  Problem: Pain Managment: Goal: General experience of comfort will improve and/or be controlled Outcome: Not Progressing   Problem: Skin Integrity: Goal: Risk for impaired skin integrity will decrease Outcome: Not Progressing   Problem: Skin Integrity: Goal: Risk for impaired skin integrity will decrease Outcome: Not Progressing   Problem: Education: Goal: Knowledge of General Education information will improve Description: Including pain rating scale, medication(s)/side effects and non-pharmacologic comfort measures Outcome: Progressing   Problem: Clinical Measurements: Goal: Ability to maintain clinical measurements within normal limits will improve Outcome: Progressing Goal: Will remain free from infection Outcome: Progressing Goal: Diagnostic test results will improve Outcome: Progressing Goal: Respiratory complications will improve Outcome: Progressing Goal: Cardiovascular complication will be avoided Outcome: Progressing   Problem: Activity: Goal: Risk for activity intolerance will decrease Outcome: Progressing   Problem: Nutrition: Goal: Adequate nutrition will be maintained Outcome: Progressing   Problem: Coping: Goal: Level of anxiety will decrease Outcome: Progressing   Problem: Elimination: Goal: Will not experience complications related to bowel motility Outcome: Progressing Goal: Will not experience complications related to urinary retention Outcome: Progressing   Problem: Safety: Goal: Ability to remain free from injury will improve Outcome: Progressing   Problem: Coping: Goal: Ability to adjust to condition or change in health will  improve Outcome: Progressing   Problem: Fluid Volume: Goal: Ability to maintain a balanced intake and output will improve Outcome: Progressing   Problem: Health Behavior/Discharge Planning: Goal: Ability to identify and utilize available resources and services will improve Outcome: Progressing Goal: Ability to manage health-related needs will improve Outcome: Progressing   Problem: Metabolic: Goal: Ability to maintain appropriate glucose levels will improve Outcome: Progressing   Problem: Nutritional: Goal: Maintenance of adequate nutrition will improve Outcome: Progressing Goal: Progress toward achieving an optimal weight will improve Outcome: Progressing   Problem: Tissue Perfusion: Goal: Adequacy of tissue perfusion will improve Outcome: Progressing

## 2023-12-09 NOTE — Progress Notes (Signed)
 Patient requested pain medicine. Writer checked BP 88/51, MD notified. Ordered bolus normal saline, orders followed. Will recheck BP and notify MD when complete. Patient otherwise asymptomatic.

## 2023-12-09 NOTE — Progress Notes (Signed)
 Progress Note   [name] DOB: 1960-07-25 DOA: 12/01/2023     8 DOS: the patient was seen and examined on 12/09/2023   Brief hospital course: Ruth Mayo is a 63 y.o. 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   07/28: to ED. Concern for worsening sacral ulcer w/ osteomyelitis (see A/P for CT report). Admitted for sepsis d/t this as well as UTI, CT also concerning for possible aspiration pneumonia/pneumonitis.  07/29: per general surgery does not need debridement. ID advised no to antibiotics but wound vac Continue daily dressing 7/31: Patient had low BP and night on-call was called. BP is soft but no symptoms. Urine our put is good. 8/1: Medically ready for discharge to skilled 8/2: Waiting for case manager to arrange discharge to nursing home   Principal Problem:   Sacral osteomyelitis (HCC) Active Problems:   Complicated UTI (urinary tract infection)   CAD (coronary artery disease)   HTN (hypertension)   HLD (hyperlipidemia)   Hypothyroidism   Type 2 diabetes mellitus with peripheral neuropathy (HCC)   Hypercalcemia   Hypokalemia   Hypophosphatemia   Presence of indwelling Foley catheter   Depression with anxiety   Obesity (BMI 30.0-34.9)   Sacral decubitus ulcer, stage IV (HCC)   Assessment and Plan:  Sacral osteomyelitis with stage IV pressure ulcer. CT showed enlarged sacral wound with osteomyelitis Clinically not septic. Seen by ID, patient condition not consistent with acute osteomyelitis,  no need for antibiotics. Wound VAC is placed, condition stable.     Complicated UTI (urinary tract infection) secondary to E. coli which was caused by indwelling Foley catheter. Multiple small nonobstructive bilateral renal calculi.  No hydronephrosis  Urine culture grew E. coli pansensitive, has completed 5 days of antibiotics.   CAD (coronary artery disease) HTN HLD s/p CABG. No CP On statin, , ACE,    Hypothyroidism Continue Synthroid .   Type 2 diabetes mellitus with peripheral neuropathy (HCC) Glucose not elevated, currently only on sliding scale insulin .   Hypercalcemia This is chronic issue.  Calcium  11.1 on admission. 9.4 8/2 Checked intact, PTH related protein, 1,25-hydroxy vitamin D, all WNL Calcium  since has normalized.   Hypokalemia and Hypophosphatemia:  Hypomagnesemia. Replete potassium, magnesium  and phosphate again today.  Recheck levels tomorrow.   Depression with anxiety Continue home Zoloft     Hypotension: 7/31 Blood pressure is low again after discontinuation of midodrine .  Will continue midodrine  for now, probably permanently.  Check cortisol level to rule out adrenal insufficiency.  Severe debility. Patient has been bedbound for years, discussed with PT, patient is not rehab with above.  But will try to get patient up to the chair.     Subjective:  Patient feels well today, does not have any complaint  Physical Exam: Vitals:   12/09/23 0655 12/09/23 0726 12/09/23 1329 12/09/23 1417  BP: (!) 90/51 (!) 98/54 (!) 88/51 (!) 99/50  Pulse:  81 90 87  Resp:  17    Temp:  98.6 F (37 C)    TempSrc:  Oral    SpO2:  99%    Weight:      Height:  General exam: Appears calm and comfortable  Respiratory system: Clear to auscultation. Respiratory effort normal. Cardiovascular system: S1 & S2 heard, RRR. No JVD, murmurs, rubs, gallops or clicks. No pedal edema. Gastrointestinal system: Abdomen is nondistended, soft and nontender. No organomegaly  or masses felt. Normal bowel sounds heard. Central nervous system: Alert and oriented. No focal neurological deficits. Extremities: Symmetric 5 x 5 power. Skin: No rashes, lesions or ulcers Psychiatry: Judgement and insight appear normal. Mood & affect appropriate.  '  Data Reviewed:  Lab results reviewed.  Family Communication: None  Disposition: Status is: Inpatient Remains inpatient appropriate because: Safe discharge.  Patient does not wish to go back to original facility, [facility] is working with the patient to find alternative facility.     Time spent: 35 minutes  Author: Murvin Mana, MD 12/09/2023 2:20 PM  For on call review [email or web address].

## 2023-12-10 DIAGNOSIS — N390 Urinary tract infection, site not specified: Secondary | ICD-10-CM | POA: Diagnosis not present

## 2023-12-10 DIAGNOSIS — E1142 Type 2 diabetes mellitus with diabetic polyneuropathy: Secondary | ICD-10-CM | POA: Diagnosis not present

## 2023-12-10 LAB — CBC
HCT: 33.3 % — ABNORMAL LOW (ref 36.0–46.0)
Hemoglobin: 10.3 g/dL — ABNORMAL LOW (ref 12.0–15.0)
MCH: 28.6 pg (ref 26.0–34.0)
MCHC: 30.9 g/dL (ref 30.0–36.0)
MCV: 92.5 fL (ref 80.0–100.0)
Platelets: 318 K/uL (ref 150–400)
RBC: 3.6 MIL/uL — ABNORMAL LOW (ref 3.87–5.11)
RDW: 14.3 % (ref 11.5–15.5)
WBC: 7.4 K/uL (ref 4.0–10.5)
nRBC: 0 % (ref 0.0–0.2)

## 2023-12-10 LAB — BASIC METABOLIC PANEL WITH GFR
Anion gap: 5 (ref 5–15)
BUN: <5 mg/dL — ABNORMAL LOW (ref 6–20)
CO2: 25 mmol/L (ref 22–32)
Calcium: 8.9 mg/dL (ref 8.9–10.3)
Chloride: 108 mmol/L (ref 98–111)
Creatinine, Ser: <0.3 mg/dL — ABNORMAL LOW (ref 0.44–1.00)
Glucose, Bld: 88 mg/dL (ref 70–99)
Potassium: 3.7 mmol/L (ref 3.5–5.1)
Sodium: 138 mmol/L (ref 135–145)

## 2023-12-10 LAB — GLUCOSE, CAPILLARY
Glucose-Capillary: 89 mg/dL (ref 70–99)
Glucose-Capillary: 132 mg/dL — ABNORMAL HIGH (ref 70–99)
Glucose-Capillary: 119 mg/dL — ABNORMAL HIGH (ref 70–99)
Glucose-Capillary: 104 mg/dL — ABNORMAL HIGH (ref 70–99)

## 2023-12-10 LAB — CORTISOL-AM, BLOOD: Cortisol - AM: 11.1 ug/dL (ref 6.7–22.6)

## 2023-12-10 LAB — MAGNESIUM: Magnesium: 2.1 mg/dL (ref 1.7–2.4)

## 2023-12-10 LAB — PHOSPHORUS: Phosphorus: 1.7 mg/dL — ABNORMAL LOW (ref 2.5–4.6)

## 2023-12-10 MED ORDER — POTASSIUM PHOSPHATES 15 MMOLE/5ML IV SOLN
45.0000 mmol | Freq: Once | INTRAVENOUS | Status: AC
  Administered 2023-12-10: 45 mmol via INTRAVENOUS
  Filled 2023-12-10: qty 15

## 2023-12-10 MED ORDER — K PHOS MONO-SOD PHOS DI & MONO 155-852-130 MG PO TABS: 500.0000 mg | ORAL_TABLET | Freq: Three times a day (TID) | ORAL | Status: DC

## 2023-12-10 NOTE — Plan of Care (Signed)
   Problem: Education: Goal: Knowledge of General Education information will improve Description Including pain rating scale, medication(s)/side effects and non-pharmacologic comfort measures Outcome: Progressing

## 2023-12-10 NOTE — Progress Notes (Signed)
 Progress Note   Patient: Ruth Mayo DOA: 12/01/2023     9 DOS: the patient was seen and examined on 12/10/2023   Brief hospital course: Berklee Battey is a 63 y.o. 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   07/28: to ED. Concern for worsening sacral ulcer w/ osteomyelitis (see A/P for CT report). Admitted for sepsis d/t this as well as UTI, CT also concerning for possible aspiration pneumonia/pneumonitis.  07/29: per general surgery does not need debridement. ID advised no to antibiotics but wound vac Continue daily dressing 7/31: Patient had low BP and night on-call was called. BP is soft but no symptoms. Urine our put is good. 8/1: Medically ready for discharge to skilled 8/2: Waiting for case manager to arrange discharge to nursing home   Principal Problem:   Sacral osteomyelitis (HCC) Active Problems:   Complicated UTI (urinary tract infection)   CAD (coronary artery disease)   HTN (hypertension)   HLD (hyperlipidemia)   Hypothyroidism   Type 2 diabetes mellitus with peripheral neuropathy (HCC)   Hypercalcemia   Hypokalemia   Hypophosphatemia   Presence of indwelling Foley catheter   Depression with anxiety   Obesity (BMI 30.0-34.9)   Sacral decubitus ulcer, stage IV (HCC)   Assessment and Plan: Sacral osteomyelitis with stage IV pressure ulcer. CT showed enlarged sacral wound with osteomyelitis Clinically not septic. Seen by ID, patient condition not consistent with acute osteomyelitis, no  need for antibiotics. Wound VAC is placed, condition stable.     Complicated UTI (urinary tract infection) secondary to E. coli which was caused by indwelling Foley catheter. Multiple small nonobstructive bilateral renal calculi.  No hydronephrosis  Urine culture grew E. coli pansensitive, has completed 5 days of antibiotics.   CAD (coronary artery disease) HTN HLD s/p CABG. No CP On statin, , ACE,    Hypothyroidism Continue Synthroid .   Type 2 diabetes mellitus with peripheral neuropathy (HCC) Glucose not elevated, currently only on sliding scale insulin .   Hypercalcemia This is chronic issue.  Calcium  11.1 on admission. 9.4 8/2 Checked intact, PTH related protein, 1,25-hydroxy vitamin D, all WNL Calcium  since has normalized.   Hypokalemia and Hypophosphatemia:  Hypomagnesemia. 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  She was given oral potassium phosphate , phosphorus dropped down to 1.7, will give 45 mmol of potassium phosphate  IV today.  Recheck levels tomorrow.   Depression with anxiety Continue home Zoloft     Hypotension: 7/31 Blood pressure is low again after discontinuation of midodrine .  Will continue midodrine  for now, probably permanently.  Cortisol level 11.1, no adrenal insufficiency. Patient continued to require bolus, midodrine  increased to 10 mg 3 times a day.   Severe debility. Patient has been bedbound for years, discussed with PT, patient is not rehab with above.  But will try to get patient up to the chair.         Subjective:  Patient feels well, discussed no shortness of breath.  No abdominal pain.  Physical Exam: Vitals:   12/09/23 2043 12/09/23 2314 12/10/23 0320 12/10/23 0745  BP: (!) 92/52 (!) 97/49 (!) 105/56 (!) 98/55  Pulse: 83 82 86 89  Resp:  20 17  Temp:   98.2 F (36.8 C) 98.3 F (36.8 C)  TempSrc:    Oral  SpO2:   100% 96%  Weight:      Height:       General exam: Appears calm and comfortable  Respiratory  system: Clear to auscultation. Respiratory effort normal. Cardiovascular system: S1 & S2 heard, RRR. No JVD, murmurs, rubs, gallops or clicks. No pedal edema. Gastrointestinal system: Abdomen is nondistended, soft and nontender. No organomegaly or masses felt. Normal bowel sounds heard. Central nervous system: Alert and oriented x3. No focal neurological deficits. Extremities: Symmetric 5 x 5 power. Skin: No rashes, lesions or ulcers Psychiatry: Judgement and insight appear normal. Mood & affect appropriate.    Data Reviewed:  Lab results reviewed.  Family Communication: None  Disposition: Status is: Inpatient Remains inpatient appropriate because: Burden of disease. Discussed with TOC, patient will be transferred back to the prior facility tomorrow, will make sure wound VAC can be taken care of.     Time spent: 35 minutes  Author: Murvin Mana, MD 12/10/2023 11:40 AM  For on call review [email or web address].

## 2023-12-11 DIAGNOSIS — N390 Urinary tract infection, site not specified: Secondary | ICD-10-CM | POA: Diagnosis not present

## 2023-12-11 DIAGNOSIS — M4628 Osteomyelitis of vertebra, sacral and sacrococcygeal region: Secondary | ICD-10-CM | POA: Diagnosis not present

## 2023-12-11 LAB — GLUCOSE, CAPILLARY
Glucose-Capillary: 113 mg/dL — ABNORMAL HIGH (ref 70–99)
Glucose-Capillary: 125 mg/dL — ABNORMAL HIGH (ref 70–99)

## 2023-12-11 LAB — BASIC METABOLIC PANEL WITH GFR
Anion gap: 5 (ref 5–15)
BUN: <5 mg/dL — ABNORMAL LOW (ref 6–20)
CO2: 23 mmol/L (ref 22–32)
Calcium: 8.9 mg/dL (ref 8.9–10.3)
Chloride: 109 mmol/L (ref 98–111)
Creatinine, Ser: <0.3 mg/dL — ABNORMAL LOW (ref 0.44–1.00)
Glucose, Bld: 100 mg/dL — ABNORMAL HIGH (ref 70–99)
Potassium: 4.3 mmol/L (ref 3.5–5.1)
Sodium: 137 mmol/L (ref 135–145)

## 2023-12-11 LAB — MAGNESIUM: Magnesium: 2 mg/dL (ref 1.7–2.4)

## 2023-12-11 LAB — PHOSPHORUS: Phosphorus: 2.3 mg/dL — ABNORMAL LOW (ref 2.5–4.6)

## 2023-12-11 MED ORDER — ACETAMINOPHEN 325 MG PO TABS: 650.0000 mg | ORAL_TABLET | Freq: Four times a day (QID) | ORAL | Status: DC | PRN

## 2023-12-11 MED ORDER — LEVOTHYROXINE SODIUM 112 MCG PO TABS: 112.0000 ug | ORAL_TABLET | Freq: Every day | ORAL | Status: DC

## 2023-12-11 MED ORDER — LORAZEPAM 1 MG PO TABS: 1.0000 mg | ORAL_TABLET | Freq: Three times a day (TID) | ORAL | 0 refills | Status: DC | PRN

## 2023-12-11 MED ORDER — SERTRALINE HCL 50 MG PO TABS
100.0000 mg | ORAL_TABLET | Freq: Every day | ORAL | Status: DC
  Administered 2023-12-11: 100 mg via ORAL

## 2023-12-11 MED ORDER — POLYETHYLENE GLYCOL 3350 17 G PO PACK
17.0000 g | PACK | Freq: Every day | ORAL | Status: DC
Start: 2023-12-11 — End: 2023-12-29

## 2023-12-11 MED ORDER — SODIUM PHOSPHATES 45 MMOLE/15ML IV SOLN
30.0000 mmol | Freq: Once | INTRAVENOUS | Status: AC
  Administered 2023-12-11: 30 mmol via INTRAVENOUS
  Filled 2023-12-11: qty 10

## 2023-12-11 MED ORDER — OXYCODONE-ACETAMINOPHEN 5-325 MG PO TABS: 1.0000 | ORAL_TABLET | ORAL | Status: DC | PRN

## 2023-12-11 MED ORDER — TRAMADOL HCL 50 MG PO TABS: 50.0000 mg | ORAL_TABLET | Freq: Two times a day (BID) | ORAL | 0 refills | Status: DC | PRN

## 2023-12-11 MED ORDER — APIXABAN 5 MG PO TABS
5.0000 mg | ORAL_TABLET | Freq: Two times a day (BID) | ORAL | Status: DC
  Administered 2023-12-11: 5 mg via ORAL

## 2023-12-11 MED ORDER — K PHOS MONO-SOD PHOS DI & MONO 155-852-130 MG PO TABS: 250.0000 mg | ORAL_TABLET | Freq: Every day | ORAL | 0 refills | Status: AC

## 2023-12-11 MED ORDER — MIDODRINE HCL 5 MG PO TABS: 10.0000 mg | ORAL_TABLET | Freq: Three times a day (TID) | ORAL | Status: DC

## 2023-12-11 NOTE — Discharge Summary (Signed)
 Physician Discharge Summary   Patient: Ruth Mayo MRN: [id number] DOB: 01/16/1961  Admit date:     12/01/2023  Discharge date: 12/11/23  Discharge Physician: Murvin Mana   PCP: Allen Lauraine CROME, PA-C   Recommendations at discharge:   Follow up with PCP in 1 week. Follow-up with wound care and continue wound VAC. Check a BMP and phosphate level in 1 week Transfer patient to a sitting position in the chair daily.  Discharge Diagnoses: Principal Problem:   Sacral osteomyelitis (HCC) Active Problems:   Complicated UTI (urinary tract infection)   CAD (coronary artery disease)   HTN (hypertension)   HLD (hyperlipidemia)   Hypothyroidism   Type 2 diabetes mellitus with peripheral neuropathy (HCC)   Hypercalcemia   Hypokalemia   Hypophosphatemia   Presence of indwelling Foley catheter   Depression with anxiety   Obesity (BMI 30.0-34.9)   Sacral decubitus ulcer, stage IV (HCC)  Resolved Problems:   Type 1 diabetes mellitus with peripheral neuropathy [facility])  Hospital Course: Ruth Mayo is a 63 y.o. 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   07/28: to ED. Concern for worsening sacral ulcer w/ osteomyelitis (see A/P for CT report). Admitted for sepsis d/t this as well as UTI, CT also concerning for possible aspiration pneumonia/pneumonitis.  07/29: per general surgery does not need debridement. ID advised no to antibiotics but wound vac Continue daily dressing 7/31: Patient had low BP and night on-call was called. BP is soft but  no symptoms. Urine our put is good. 8/1: Medically ready for discharge to skilled 8/2: Waiting for case manager to arrange discharge to nursing home Patient was In the hospital for additional days, due to the fact that the patient want to change facility, patient also had a significant hypotension, requiring modification of medications and a fluid bolus. Condition finally improved, medically stable for discharge.  Discussed with TOC, patient has to go back to her original facility, TOC has already set up wound VAC; they also will make sure that facility can manage her wound VAC.  Assessment and Plan: Sacral osteomyelitis with stage IV pressure ulcer. CT showed enlarged sacral wound with osteomyelitis Clinically not septic. Seen by ID, patient condition not consistent with acute osteomyelitis, no need for antibiotics. Wound VAC is placed, condition stable.     Complicated UTI (urinary tract infection) secondary to E. coli which was caused by indwelling Foley catheter. Multiple small nonobstructive bilateral renal calculi.  No hydronephrosis  Urine culture grew E. coli pansensitive, has completed 5 days of antibiotics.   CAD (coronary artery disease) HTN HLD s/p CABG. No CP On statin, , ACE,    Hypothyroidism Continue Synthroid .   Type 2 diabetes mellitus with peripheral neuropathy (HCC) Glucose not elevated, currently only on sliding scale insulin .   Hypercalcemia This is chronic issue.  Calcium  11.1 on admission. 9.4 8/2 Checked intact, PTH related protein, 1,25-hydroxy vitamin D, all WNL Calcium  since has normalized.   Hypokalemia and Hypophosphatemia:  Hypomagnesemia. 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  She was given oral potassium phosphate , phosphorus dropped down to 1.7 8/7, 45 mmol of potassium phosphate  given.  Phosphorus level today is 2.3, will  give additional 30 mmol of sodium phosphate .  Also ordered to 250 mg of potassium phosphate  daily x 7days/check  phosphate level in 1 week.   Depression with anxiety Continue home Zoloft     Hypotension: 7/31 Blood pressure is low again after discontinuation of midodrine .  Will continue midodrine  for now, probably permanently.  Cortisol level 11.1, no adrenal insufficiency. Patient continued to require bolus, midodrine  increased to 10 mg 3 times a day.  Blood pressure more stable now.   Severe debility. Patient has been bedbound for years, discussed with PT, patient is not rehabatable.  Long-term prognosis is poor.  Will need transfer patient to sitting position daily.         Consultants:  Procedures performed: None  Disposition: Long term care facility Diet recommendation:  Discharge Diet Orders (From admission, onward)     Start     Ordered   12/11/23 0000  Diet - low sodium heart healthy        12/11/23 0902           Cardiac diet DISCHARGE MEDICATION: Allergies as of 12/11/2023   No Known Allergies      Medication List     STOP taking these medications    clotrimazole -betamethasone  cream Commonly known as: Lotrisone    Dulaglutide  0.75 MG/0.5ML Soaj Commonly known as: Trulicity    Lantus  SoloStar 100 UNIT/ML Solostar Pen Generic drug: insulin  glargine   lisinopril  5 MG tablet Commonly known as: ZESTRIL    metFORMIN  1000 MG tablet Commonly known as: GLUCOPHAGE    OxyCONTIN  10 MG 12 hr tablet Generic drug: oxyCODONE    rosuvastatin  10 MG tablet Commonly known as: Crestor        TAKE these medications    apixaban  5 MG Tabs tablet Commonly known as: ELIQUIS  Take 5 mg by mouth 2 (two) times daily.   atorvastatin 80 MG tablet Commonly known as: LIPITOR 80 mg by Enteral route daily.   escitalopram  20 MG tablet Commonly known as: LEXAPRO  Take 20 mg by mouth daily.   gabapentin  300 MG capsule Commonly known as: NEURONTIN  Take 3 capsules (900 mg total) by mouth 3 (three) times daily.   levothyroxine  50 MCG tablet Commonly known as: SYNTHROID  Take 1  tablet (50 mcg total) by mouth daily. What changed: how much to take   LORazepam  1 MG tablet Commonly known as: ATIVAN  Take 1 tablet (1 mg total) by mouth every 8 (eight) hours as needed for anxiety. TAKE ONE TABLET BY MOUTH TWICE A DAY FOR ANXIETY What changed:  how much to take how to take this when to take this reasons to take this   midodrine  5 MG tablet Commonly known as: PROAMATINE  Take 2 tablets (10 mg total) by mouth 3 (three) times daily with meals. What changed:  how much to take when to take this   phosphorus [id number] MG tablet Commonly known as: K PHOS  NEUTRAL Take 1 tablet (250 mg total) by mouth daily for 7 days.   polyethylene glycol 17 g packet Commonly known as: MIRALAX  / GLYCOLAX  Take 17 g by mouth daily.   sertraline  100 MG tablet Commonly known as: ZOLOFT  Take 100 mg by mouth daily.   traMADol  50 MG tablet Commonly known as: ULTRAM  Take 1 tablet (50 mg total) by mouth every 12 (twelve) hours as needed for severe pain (pain score 7-10).               Discharge Care Instructions  (From admission, onward)  Start     Ordered   12/11/23 0000  Discharge wound care:       Comments: Negative pressure wound therapy  Do not change dressing      Comments: Next dressing change 8/8; if SNF has compatible machine (NPWT device). Ok to clamp dressing off and transport to facility at which time SNF can connect to their machine.   12/11/23 0902            Follow-up Information     Weber, Lauraine CROME, PA-C Follow up in 1 week(s).   Specialty: Physician Assistant Contact information: [address] [place] [address] [phone]                Discharge Exam: Fredricka Weights   12/01/23 1435 12/06/23 1807  Weight: 80.3 kg 80.3 kg   General exam: Appears calm and comfortable  Respiratory system: Clear to auscultation. Respiratory effort normal. Cardiovascular system: S1 & S2 heard, RRR. No JVD, murmurs, rubs,  gallops or clicks. No pedal edema. Gastrointestinal system: Abdomen is nondistended, soft and nontender. No organomegaly or masses felt. Normal bowel sounds heard. Central nervous system: Alert and oriented x3. No focal neurological deficits. Extremities: Symmetric 5 x 5 power. Skin: No rashes, lesions or ulcers Psychiatry: Judgement and insight appear normal. Mood & affect appropriate.    Condition at discharge: fair  The results of significant diagnostics from this hospitalization (including imaging, microbiology, ancillary and laboratory) are listed below for reference.   Imaging Studies: CT ABDOMEN PELVIS W CONTRAST Result Date: 12/01/2023 CLINICAL DATA:  Abdominal pain, acute, nonlocalized weakness, abd pain, hx sacral osteomyelitis EXAM: CT ABDOMEN AND PELVIS WITH CONTRAST TECHNIQUE: Multidetector CT imaging of the abdomen and pelvis was performed using the standard protocol following bolus administration of intravenous contrast. RADIATION DOSE REDUCTION: This exam was performed according to the departmental dose-optimization program which includes automated exposure control, adjustment of the mA and/or kV according to patient size and/or use of iterative reconstruction technique. CONTRAST:  OMNIPAQUE  IOHEXOL  300 MG/ML  SOLN COMPARISON:  October 17, 2023 FINDINGS: Lower chest: Dependent airspace opacities in both lung bases, more nodular on the left, worrisome for aspiration. No pleural effusion. Multi-vessel coronary atherosclerosis. Sternotomy wires. Hepatobiliary: No mass.No radiopaque stones or wall thickening of the gallbladder. No intrahepatic or extrahepatic biliary ductal dilation. The portal veins are patent. Pancreas: Diffuse fatty atrophy of the pancreatic parenchyma. No mass or ductal dilation. no peripancreatic inflammation or fluid collection. Spleen: Normal size. No mass. Adrenals/Urinary Tract: No adrenal masses. No renal mass. Multiple small nonobstructive bilateral calyceal  calculi. No hydronephrosis. The urinary bladder is completely decompressed with a urinary catheter in place. Gas in the bladder lumen is likely related to catheterization. Circumferential wall thickening of the urinary bladder. Stomach/Bowel: The stomach contains ingested material without focal abnormality. Percutaneous gastrostomy tube well-positioned in the gastric antrum. No small bowel wall thickening or inflammation. No small bowel obstruction.Normal appendix. Vascular/Lymphatic: No aortic aneurysm. Diffuse aortoiliac atherosclerosis. No intraabdominal or pelvic lymphadenopathy. Reproductive: Age-related atrophy of the uterus and ovaries. No concerning adnexal mass.No free pelvic fluid. Other: No pneumoperitoneum, ascites, or mesenteric inflammation. Musculoskeletal: No acute fracture. Absence of the lower sacrum has progressed in the interim. Redemonstrated large sacral decubitus ulcer, which is 5 cm deep, contacting the sacrococcygeal junction. Increased craniocaudal extent measuring 11.5 cm with undermining of the subcutaneous fat superiorly. Along the right lateral aspect within the medial right gluteal musculature, there is a small peripherally enhancing gas and fluid collection  measuring 1.7 x 1.6 x 1.8 cm. IMPRESSION: 1. Circumferential wall thickening of the bladder may be due to underdistention or chronic inflammation from the indwelling urinary catheter. If there is concern for acute cystitis, laboratory correlation would be recommended. 2. Increasing size and depth of the sacral decubitus ulcer, measuring 5 cm deep and extending 11.5 cm in the craniocaudal extent. Progressive bony destruction of the lower sacrum, consistent with osteomyelitis. Along the right lateral aspect in the medial right gluteal musculature, there is a small peripherally enhancing gas and fluid collection measuring 1.7 x 1.6 x 1.8 cm. This could reflect an intramuscular abscess or gas and fluid trapped within a lateral  extension of the decubitus ulcer. 3. Multiple small nonobstructive bilateral renal calculi. No hydronephrosis. 4. Dependent airspace opacities in both lungs, likely atelectasis on the right. On the left, there is more pronounced nodularity, which is worrisome for aspiration. Aortic Atherosclerosis (ICD10-I70.0). Electronically Signed   By: Rogelia Myers M.D.   On: 12/01/2023 19:57    Microbiology: Results for orders placed or performed during the hospital encounter of 12/01/23  Urine Culture     Status: Abnormal   Collection Time: 12/01/23  7:11 PM   Specimen: Urine, Random  Result Value Ref Range Status   Specimen Description   Final    URINE, RANDOM Performed at [facility], [address] [place], [place] [address]    Special Requests   Final    NONE Reflexed from [id number] Performed at [facility], [address] [place], [place] [address]    Culture >=100,000 COLONIES/mL ESCHERICHIA COLI (A)  Final   Report Status 12/03/2023 FINAL  Final   Organism ID, Bacteria ESCHERICHIA COLI (A)  Final      Susceptibility   Escherichia coli - MIC*    AMPICILLIN 8 SENSITIVE Sensitive     CEFAZOLIN <=4 SENSITIVE Sensitive     CEFEPIME <=0.12 SENSITIVE Sensitive     CEFTRIAXONE  <=0.25 SENSITIVE Sensitive     CIPROFLOXACIN <=0.25 SENSITIVE Sensitive     GENTAMICIN <=1 SENSITIVE Sensitive     IMIPENEM <=0.25 SENSITIVE Sensitive     NITROFURANTOIN <=16 SENSITIVE Sensitive     TRIMETH/SULFA <=20 SENSITIVE Sensitive     AMPICILLIN/SULBACTAM 4 SENSITIVE Sensitive     PIP/TAZO <=4 SENSITIVE Sensitive ug/mL    * >=100,000 COLONIES/mL ESCHERICHIA COLI  Culture, blood (Routine X 2) w Reflex to ID Panel     Status: None   Collection Time: 12/02/23  2:31 AM   Specimen: BLOOD RIGHT ARM  Result Value Ref Range Status   Specimen Description BLOOD RIGHT ARM  Final   Special Requests   Final    BOTTLES DRAWN AEROBIC AND ANAEROBIC Blood Culture adequate volume   Culture   Final     NO GROWTH 5 DAYS Performed at [facility], [address] [place], [place] [address]    Report Status 12/07/2023 FINAL  Final  Culture, blood (Routine X 2) w Reflex to ID Panel     Status: None   Collection Time: 12/02/23  2:39 AM   Specimen: BLOOD LEFT HAND  Result Value Ref Range Status   Specimen Description BLOOD LEFT HAND  Final   Special Requests   Final    BOTTLES DRAWN AEROBIC AND ANAEROBIC Blood Culture adequate volume   Culture   Final    NO GROWTH 5 DAYS Performed at [facility], [address] [place], [place] [address]    Report Status 12/07/2023 FINAL  Final  Aerobic Culture w Gram Stain (superficial specimen)     Status: None   Collection Time: 12/03/23 12:13 PM   Specimen: Wound  Result Value Ref Range Status   Specimen Description   Final    WOUND Performed at [facility], [address] [place], [place] [address]    Special Requests   Final    NONE Performed at [facility], [address] [place], [place] [address]    Gram Stain   Final    RARE WBC PRESENT, PREDOMINANTLY PMN NO ORGANISMS SEEN Performed at [facility], [address], [place], [place] [address]    Culture   Final    RARE PROTEUS MIRABILIS RARE STAPHYLOCOCCUS AUREUS RARE MORGANELLA MORGANII    Report Status 12/07/2023 FINAL  Final   Organism ID, Bacteria PROTEUS MIRABILIS  Final   Organism ID, Bacteria MORGANELLA MORGANII  Final   Organism ID, Bacteria STAPHYLOCOCCUS AUREUS  Final      Susceptibility   Morganella morganii - MIC*    AMPICILLIN >=32 RESISTANT Resistant     CEFTAZIDIME 2 SENSITIVE Sensitive     CIPROFLOXACIN >=4 RESISTANT Resistant     GENTAMICIN <=1 SENSITIVE Sensitive     IMIPENEM 1 SENSITIVE Sensitive     TRIMETH/SULFA <=20 SENSITIVE Sensitive     AMPICILLIN/SULBACTAM >=32 RESISTANT Resistant     PIP/TAZO <=4 SENSITIVE Sensitive ug/mL    * RARE MORGANELLA MORGANII   Proteus mirabilis - MIC*    AMPICILLIN <=2  SENSITIVE Sensitive     CEFEPIME <=0.12 SENSITIVE Sensitive     CEFTAZIDIME <=1 SENSITIVE Sensitive     CEFTRIAXONE  <=0.25 SENSITIVE Sensitive     CIPROFLOXACIN >=4 RESISTANT Resistant     GENTAMICIN <=1 SENSITIVE Sensitive     IMIPENEM 2 SENSITIVE Sensitive     TRIMETH/SULFA 40 SENSITIVE Sensitive     AMPICILLIN/SULBACTAM <=2 SENSITIVE Sensitive     PIP/TAZO <=4 SENSITIVE Sensitive ug/mL    * RARE PROTEUS MIRABILIS   Staphylococcus aureus - MIC*    CIPROFLOXACIN >=8 RESISTANT Resistant     ERYTHROMYCIN >=8 RESISTANT Resistant     GENTAMICIN <=0.5 SENSITIVE Sensitive     OXACILLIN >=4 RESISTANT Resistant     TETRACYCLINE >=16 RESISTANT Resistant     VANCOMYCIN  1 SENSITIVE Sensitive     TRIMETH/SULFA >=320 RESISTANT Resistant     CLINDAMYCIN >=8 RESISTANT Resistant     RIFAMPIN <=0.5 SENSITIVE Sensitive     Inducible Clindamycin NEGATIVE Sensitive     LINEZOLID 2 SENSITIVE Sensitive     * RARE STAPHYLOCOCCUS AUREUS    Labs: CBC: Recent Labs  Lab 12/06/23 0335 12/10/23 0422  WBC 5.5 7.4  HGB 9.3* 10.3*  HCT 30.5* 33.3*  MCV 93.0 92.5  PLT 297 318   Basic Metabolic Panel: Recent Labs  Lab 12/06/23 0335 12/07/23 0323 12/07/23 1406 12/08/23 0354 12/09/23 0247 12/10/23 0422 12/11/23 0452  NA 136 136 135  --  137 138 137  K 3.1* 4.0 3.9  --  3.2* 3.7 4.3  CL 107 110 107  --  110 108 109  CO2 23 22 23   --  23 25 23   GLUCOSE 90 71 123*  --  125* 88 100*  BUN 7 6 7   --  8 <5* <5*  CREATININE <0.30* 0.32* <0.30*  --  0.32* <0.30* <0.30*  CALCIUM  9.4 9.2 9.1  --  8.7* 8.9 8.9  MG 1.8 1.7  --  1.7 1.6* 2.1 2.0  PHOS 1.7*  --   --  2.2* 2.4* 1.7* 2.3*   Liver Function Tests: No results for input(s): AST, ALT, ALKPHOS, BILITOT, PROT, ALBUMIN in the last 168 hours. CBG: Recent Labs  Lab 12/10/23 0743 12/10/23 1125 12/10/23 1628 12/10/23 2210 12/11/23 0734  GLUCAP 89 132* 119* 104* 113*    Discharge time spent: greater than 30  minutes.  Signed: Murvin Mana, MD [facility] 12/11/2023

## 2023-12-11 NOTE — Plan of Care (Signed)
   Problem: Education: Goal: Knowledge of General Education information will improve Description Including pain rating scale, medication(s)/side effects and non-pharmacologic comfort measures Outcome: Progressing

## 2023-12-11 NOTE — Progress Notes (Signed)
 Pt left via transport, pain med given for the ride. Wound vac tubing clamped.

## 2023-12-11 NOTE — Progress Notes (Signed)
 The writer called [facility] and gave report to Jessica,LPN. Nurse made aware of pt being on a wound vac, and she states that they have one available. Pt will continue to be monitored until pick up.

## 2023-12-11 NOTE — Consult Note (Signed)
 WOC notified that patient will DC to SNF with [facility] comparable DME. Orders updated appropriately.   Mansa Willers [facility], CNS, CWON-AP [phone]

## 2023-12-11 NOTE — TOC Transition Note (Signed)
 Transition of Care [facility]) - Discharge Note   Patient Details  Name: Ruth Mayo MRN: [id number] Date of Birth: May 16, 1960  Transition of Care [facility]) CM/SW Contact:  Alvaro Louder, LCSW Phone Number: 12/11/2023, 3:43 PM   Clinical Narrative:   LCSW Received insurance approval to admit to [facility] health Care. LCSWA confirmed with MD that patient is stable for discharge. LCSWA notified spouse Lynwood and they are in agreement with discharge. LCSWA confirmed with SNF that bed is available Transport arranged with [facility] for next available  RM: 68A Number to call report: [phone]     Final next level of care: Skilled Nursing Facility Barriers to Discharge: No Barriers Identified   Patient Goals and CMS Choice            Discharge Placement              Patient [facility] bed at: [facility] Patient to be transferred to facility by: [facility] Name of family member notified: Lynwood Patient and family notified of of transfer: 12/11/23  Discharge Plan and Services Additional resources added to the After Visit Summary for                                       Social Drivers of Health (SDOH) Interventions SDOH Screenings   Food Insecurity: No Food Insecurity (12/01/2023)  Housing: Low Risk  (12/01/2023)  Transportation Needs: No Transportation Needs (12/01/2023)  Utilities: Not At Risk (12/01/2023)  Financial Resource Strain: Patient Unable To Answer (03/04/2023)   Received from [facility]  Physical Activity: Insufficiently Active (07/23/2022)   Received from [facility]  Social Connections: Patient Unable To Answer (03/04/2023)   Received from [facility]  Stress: No Stress Concern Present (03/02/2023)   Received from [facility]  Recent Concern: Stress - Stress Concern Present (01/14/2023)   Received from [facility]  Tobacco Use: Medium Risk (12/01/2023)     Readmission Risk Interventions     No data to display

## 2023-12-13 LAB — PTH-RELATED PEPTIDE: PTH-related peptide: <2 pmol/L

## 2023-12-20 ENCOUNTER — Other Ambulatory Visit: Payer: Self-pay

## 2023-12-20 ENCOUNTER — Inpatient Hospital Stay

## 2023-12-20 ENCOUNTER — Emergency Department

## 2023-12-20 ENCOUNTER — Inpatient Hospital Stay
Admission: EM | Admit: 2023-12-20 | Discharge: 2023-12-23 | DRG: 698 | Disposition: A | Discharge status: Skilled Nursing Facility | Attending: Internal Medicine | Admitting: Internal Medicine

## 2023-12-20 DIAGNOSIS — Z7401 Bed confinement status: Secondary | ICD-10-CM

## 2023-12-20 DIAGNOSIS — R531 Weakness: Principal | ICD-10-CM

## 2023-12-20 DIAGNOSIS — N3001 Acute cystitis with hematuria: Secondary | ICD-10-CM | POA: Diagnosis present

## 2023-12-20 DIAGNOSIS — M4628 Osteomyelitis of vertebra, sacral and sacrococcygeal region: Secondary | ICD-10-CM | POA: Diagnosis present

## 2023-12-20 DIAGNOSIS — Z951 Presence of aortocoronary bypass graft: Secondary | ICD-10-CM

## 2023-12-20 DIAGNOSIS — E8809 Other disorders of plasma-protein metabolism, not elsewhere classified: Secondary | ICD-10-CM | POA: Diagnosis present

## 2023-12-20 DIAGNOSIS — Z8249 Family history of ischemic heart disease and other diseases of the circulatory system: Secondary | ICD-10-CM

## 2023-12-20 DIAGNOSIS — E113519 Type 2 diabetes mellitus with proliferative diabetic retinopathy with macular edema, unspecified eye: Secondary | ICD-10-CM | POA: Diagnosis present

## 2023-12-20 DIAGNOSIS — G9341 Metabolic encephalopathy: Secondary | ICD-10-CM | POA: Diagnosis present

## 2023-12-20 DIAGNOSIS — N200 Calculus of kidney: Secondary | ICD-10-CM | POA: Diagnosis present

## 2023-12-20 DIAGNOSIS — Z833 Family history of diabetes mellitus: Secondary | ICD-10-CM

## 2023-12-20 DIAGNOSIS — F419 Anxiety disorder, unspecified: Secondary | ICD-10-CM | POA: Diagnosis present

## 2023-12-20 DIAGNOSIS — Z86711 Personal history of pulmonary embolism: Secondary | ICD-10-CM

## 2023-12-20 DIAGNOSIS — T83518A Infection and inflammatory reaction due to other urinary catheter, initial encounter: Secondary | ICD-10-CM | POA: Diagnosis present

## 2023-12-20 DIAGNOSIS — Z79899 Other long term (current) drug therapy: Secondary | ICD-10-CM

## 2023-12-20 DIAGNOSIS — L89154 Pressure ulcer of sacral region, stage 4: Secondary | ICD-10-CM | POA: Diagnosis present

## 2023-12-20 DIAGNOSIS — I10 Essential (primary) hypertension: Secondary | ICD-10-CM | POA: Diagnosis present

## 2023-12-20 DIAGNOSIS — Z7901 Long term (current) use of anticoagulants: Secondary | ICD-10-CM | POA: Diagnosis not present

## 2023-12-20 DIAGNOSIS — Z79890 Hormone replacement therapy (postmenopausal): Secondary | ICD-10-CM

## 2023-12-20 DIAGNOSIS — I959 Hypotension, unspecified: Secondary | ICD-10-CM | POA: Diagnosis present

## 2023-12-20 DIAGNOSIS — Z8349 Family history of other endocrine, nutritional and metabolic diseases: Secondary | ICD-10-CM

## 2023-12-20 DIAGNOSIS — Y846 Urinary catheterization as the cause of abnormal reaction of the patient, or of later complication, without mention of misadventure at the time of the procedure: Secondary | ICD-10-CM | POA: Diagnosis present

## 2023-12-20 DIAGNOSIS — F339 Major depressive disorder, recurrent, unspecified: Secondary | ICD-10-CM | POA: Diagnosis present

## 2023-12-20 DIAGNOSIS — I2510 Atherosclerotic heart disease of native coronary artery without angina pectoris: Secondary | ICD-10-CM | POA: Diagnosis present

## 2023-12-20 DIAGNOSIS — E1169 Type 2 diabetes mellitus with other specified complication: Secondary | ICD-10-CM | POA: Diagnosis present

## 2023-12-20 DIAGNOSIS — E785 Hyperlipidemia, unspecified: Secondary | ICD-10-CM | POA: Diagnosis present

## 2023-12-20 DIAGNOSIS — E1142 Type 2 diabetes mellitus with diabetic polyneuropathy: Secondary | ICD-10-CM | POA: Diagnosis present

## 2023-12-20 DIAGNOSIS — I69311 Memory deficit following cerebral infarction: Secondary | ICD-10-CM

## 2023-12-20 DIAGNOSIS — F32A Depression, unspecified: Secondary | ICD-10-CM | POA: Diagnosis not present

## 2023-12-20 DIAGNOSIS — E860 Dehydration: Secondary | ICD-10-CM | POA: Diagnosis present

## 2023-12-20 DIAGNOSIS — E039 Hypothyroidism, unspecified: Secondary | ICD-10-CM | POA: Diagnosis present

## 2023-12-20 DIAGNOSIS — N390 Urinary tract infection, site not specified: Secondary | ICD-10-CM | POA: Diagnosis present

## 2023-12-20 DIAGNOSIS — B965 Pseudomonas (aeruginosa) (mallei) (pseudomallei) as the cause of diseases classified elsewhere: Secondary | ICD-10-CM | POA: Diagnosis present

## 2023-12-20 DIAGNOSIS — E876 Hypokalemia: Secondary | ICD-10-CM | POA: Diagnosis present

## 2023-12-20 DIAGNOSIS — Z87891 Personal history of nicotine dependence: Secondary | ICD-10-CM

## 2023-12-20 LAB — COMPREHENSIVE METABOLIC PANEL WITH GFR
ALT: 11 U/L (ref 0–44)
AST: 33 U/L (ref 15–41)
Albumin: 1.6 g/dL — ABNORMAL LOW (ref 3.5–5.0)
Alkaline Phosphatase: 151 U/L — ABNORMAL HIGH (ref 38–126)
Anion gap: 14 (ref 5–15)
BUN: <5 mg/dL — ABNORMAL LOW (ref 6–20)
CO2: 17 mmol/L — ABNORMAL LOW (ref 22–32)
Calcium: 9.1 mg/dL (ref 8.9–10.3)
Chloride: 103 mmol/L (ref 98–111)
Creatinine, Ser: <0.3 mg/dL — ABNORMAL LOW (ref 0.44–1.00)
Glucose, Bld: 69 mg/dL — ABNORMAL LOW (ref 70–99)
Potassium: 4 mmol/L (ref 3.5–5.1)
Sodium: 134 mmol/L — ABNORMAL LOW (ref 135–145)
Total Bilirubin: 1.5 mg/dL — ABNORMAL HIGH (ref 0.0–1.2)
Total Protein: 5.4 g/dL — ABNORMAL LOW (ref 6.5–8.1)

## 2023-12-20 LAB — URINALYSIS, W/ REFLEX TO CULTURE (INFECTION SUSPECTED)
Bilirubin Urine: NEGATIVE
Glucose, UA: NEGATIVE mg/dL
Ketones, ur: 20 mg/dL — AB
Nitrite: POSITIVE — AB
Protein, ur: 100 mg/dL — AB
RBC / HPF: >50 RBC/hpf (ref 0–5)
Specific Gravity, Urine: 1.018 (ref 1.005–1.030)
Squamous Epithelial / HPF: 0 /HPF (ref 0–5)
WBC, UA: >50 WBC/hpf (ref 0–5)
pH: 5 (ref 5.0–8.0)

## 2023-12-20 LAB — CBC
HCT: 35.5 % — ABNORMAL LOW (ref 36.0–46.0)
Hemoglobin: 11.5 g/dL — ABNORMAL LOW (ref 12.0–15.0)
MCH: 27.8 pg (ref 26.0–34.0)
MCHC: 32.4 g/dL (ref 30.0–36.0)
MCV: 86 fL (ref 80.0–100.0)
Platelets: 416 K/uL — ABNORMAL HIGH (ref 150–400)
RBC: 4.13 MIL/uL (ref 3.87–5.11)
RDW: 15.2 % (ref 11.5–15.5)
WBC: 8.3 K/uL (ref 4.0–10.5)
nRBC: 0 % (ref 0.0–0.2)

## 2023-12-20 LAB — URINALYSIS, ROUTINE W REFLEX MICROSCOPIC
Bilirubin Urine: NEGATIVE
Glucose, UA: NEGATIVE mg/dL
Ketones, ur: 20 mg/dL — AB
Nitrite: POSITIVE — AB
Protein, ur: 30 mg/dL — AB
Specific Gravity, Urine: 1.011 (ref 1.005–1.030)
Squamous Epithelial / HPF: 0 /HPF (ref 0–5)
WBC, UA: >50 WBC/hpf (ref 0–5)
pH: 6 (ref 5.0–8.0)

## 2023-12-20 LAB — TROPONIN I (HIGH SENSITIVITY)
Troponin I (High Sensitivity): 9 ng/L (ref <18)
Troponin I (High Sensitivity): 8 ng/L (ref <18)

## 2023-12-20 LAB — LACTIC ACID, PLASMA
Lactic Acid, Venous: 1.3 mmol/L (ref 0.5–1.9)
Lactic Acid, Venous: 1.2 mmol/L (ref 0.5–1.9)

## 2023-12-20 MED ORDER — LEVOTHYROXINE SODIUM 50 MCG PO TABS: 50.0000 ug | ORAL_TABLET | Freq: Every day | ORAL | Status: DC

## 2023-12-20 MED ORDER — LORAZEPAM 1 MG PO TABS: 1.0000 mg | ORAL_TABLET | Freq: Two times a day (BID) | ORAL | Status: DC | PRN

## 2023-12-20 MED ORDER — SODIUM CHLORIDE 0.9 % IV BOLUS
1000.0000 mL | Freq: Once | INTRAVENOUS | Status: AC
  Administered 2023-12-20: 1000 mL via INTRAVENOUS

## 2023-12-20 MED ORDER — HYDROCODONE-ACETAMINOPHEN 5-325 MG PO TABS
1.0000 | ORAL_TABLET | ORAL | Status: DC | PRN
  Administered 2023-12-21 – 2023-12-23 (×8): 1
  Filled 2023-12-20 (×9): qty 1

## 2023-12-20 MED ORDER — SERTRALINE HCL 50 MG PO TABS: 100.0000 mg | ORAL_TABLET | Freq: Every day | ORAL | Status: DC

## 2023-12-20 MED ORDER — ATORVASTATIN CALCIUM 20 MG PO TABS
80.0000 mg | ORAL_TABLET | Freq: Every day | ORAL | Status: DC
  Administered 2023-12-21 – 2023-12-23 (×3): 80 mg
  Filled 2023-12-20 (×3): qty 4

## 2023-12-20 MED ORDER — MIDODRINE HCL 5 MG PO TABS
10.0000 mg | ORAL_TABLET | Freq: Every day | ORAL | Status: DC
  Administered 2023-12-21 – 2023-12-23 (×3): 10 mg
  Filled 2023-12-20 (×2): qty 2

## 2023-12-20 MED ORDER — BISACODYL 5 MG PO TBEC: 5.0000 mg | DELAYED_RELEASE_TABLET | Freq: Every day | ORAL | Status: DC | PRN

## 2023-12-20 MED ORDER — SODIUM CHLORIDE 0.9 % IV SOLN
2.0000 g | INTRAVENOUS | Status: DC
  Administered 2023-12-21: 2 g via INTRAVENOUS
  Filled 2023-12-20 (×2): qty 20

## 2023-12-20 MED ORDER — APIXABAN 5 MG PO TABS
5.0000 mg | ORAL_TABLET | Freq: Two times a day (BID) | ORAL | Status: DC
  Administered 2023-12-20 – 2023-12-23 (×6): 5 mg
  Filled 2023-12-20 (×6): qty 1

## 2023-12-20 MED ORDER — APIXABAN 5 MG PO TABS: 5.0000 mg | ORAL_TABLET | Freq: Two times a day (BID) | ORAL | Status: DC

## 2023-12-20 MED ORDER — ESCITALOPRAM OXALATE 10 MG PO TABS
20.0000 mg | ORAL_TABLET | Freq: Every day | ORAL | Status: DC
  Administered 2023-12-21: 20 mg
  Filled 2023-12-20: qty 2

## 2023-12-20 MED ORDER — NYSTATIN 100000 UNIT/GM EX POWD
1.0000 | Freq: Two times a day (BID) | CUTANEOUS | Status: DC
  Administered 2023-12-20 – 2023-12-23 (×6): 1 via TOPICAL
  Filled 2023-12-20: qty 15

## 2023-12-20 MED ORDER — LACTATED RINGERS IV BOLUS (SEPSIS)
1000.0000 mL | Freq: Once | INTRAVENOUS | Status: AC
  Administered 2023-12-20: 1000 mL via INTRAVENOUS

## 2023-12-20 MED ORDER — GUAIFENESIN 100 MG/5ML PO LIQD
10.0000 mL | Freq: Two times a day (BID) | ORAL | Status: DC
  Administered 2023-12-20 – 2023-12-23 (×6): 10 mL
  Filled 2023-12-20 (×6): qty 10

## 2023-12-20 MED ORDER — POLYETHYLENE GLYCOL 3350 17 G PO PACK
17.0000 g | PACK | Freq: Every day | ORAL | Status: DC
  Administered 2023-12-21 – 2023-12-22 (×2): 17 g
  Filled 2023-12-20 (×3): qty 1

## 2023-12-20 MED ORDER — SENNOSIDES-DOCUSATE SODIUM 8.6-50 MG PO TABS: 1.0000 | ORAL_TABLET | Freq: Every day | ORAL | Status: DC

## 2023-12-20 MED ORDER — GABAPENTIN 300 MG PO CAPS
900.0000 mg | ORAL_CAPSULE | Freq: Three times a day (TID) | ORAL | Status: DC
  Administered 2023-12-20 – 2023-12-21 (×4): 900 mg
  Filled 2023-12-20 (×4): qty 3

## 2023-12-20 MED ORDER — ATORVASTATIN CALCIUM 20 MG PO TABS: 80.0000 mg | ORAL_TABLET | Freq: Every day | ORAL | Status: DC

## 2023-12-20 MED ORDER — TRAMADOL HCL 50 MG PO TABS
50.0000 mg | ORAL_TABLET | Freq: Two times a day (BID) | ORAL | Status: DC | PRN
  Administered 2023-12-20 – 2023-12-21 (×2): 50 mg via ORAL
  Filled 2023-12-20 (×3): qty 1

## 2023-12-20 MED ORDER — ACETAMINOPHEN 500 MG PO TABS
1000.0000 mg | ORAL_TABLET | Freq: Once | ORAL | Status: DC
  Filled 2023-12-20: qty 2

## 2023-12-20 MED ORDER — ONDANSETRON 4 MG PO TBDP: 4.0000 mg | ORAL_TABLET | Freq: Three times a day (TID) | ORAL | Status: DC | PRN

## 2023-12-20 MED ORDER — GUAIFENESIN ER 600 MG PO TB12: 600.0000 mg | ORAL_TABLET | Freq: Two times a day (BID) | ORAL | Status: DC

## 2023-12-20 MED ORDER — MIDODRINE HCL 5 MG PO TABS: 10.0000 mg | ORAL_TABLET | Freq: Every day | ORAL | Status: DC

## 2023-12-20 MED ORDER — SODIUM CHLORIDE 0.9 % IV SOLN
2.0000 g | Freq: Once | INTRAVENOUS | Status: AC
  Administered 2023-12-20: 2 g via INTRAVENOUS
  Filled 2023-12-20: qty 20

## 2023-12-20 MED ORDER — LEVOTHYROXINE SODIUM 50 MCG PO TABS
50.0000 ug | ORAL_TABLET | Freq: Every day | ORAL | Status: DC
  Administered 2023-12-21 – 2023-12-23 (×3): 50 ug
  Filled 2023-12-20 (×3): qty 1

## 2023-12-20 MED ORDER — ESCITALOPRAM OXALATE 10 MG PO TABS: 20.0000 mg | ORAL_TABLET | Freq: Every day | ORAL | Status: DC

## 2023-12-20 MED ORDER — FAMOTIDINE 20 MG PO TABS: 40.0000 mg | ORAL_TABLET | Freq: Every day | ORAL | Status: DC

## 2023-12-20 MED ORDER — IOHEXOL 300 MG/ML  SOLN
75.0000 mL | Freq: Once | INTRAMUSCULAR | Status: AC | PRN
Start: 2023-12-20 — End: 2023-12-20
  Administered 2023-12-20: 75 mL via INTRAVENOUS

## 2023-12-20 MED ORDER — SENNOSIDES-DOCUSATE SODIUM 8.6-50 MG PO TABS
1.0000 | ORAL_TABLET | Freq: Every day | ORAL | Status: DC
  Administered 2023-12-20: 1
  Filled 2023-12-20: qty 1

## 2023-12-20 MED ORDER — FAMOTIDINE 20 MG PO TABS
40.0000 mg | ORAL_TABLET | Freq: Every day | ORAL | Status: DC
  Administered 2023-12-20: 40 mg
  Filled 2023-12-20: qty 2

## 2023-12-20 MED ORDER — POLYETHYLENE GLYCOL 3350 17 G PO PACK: 17.0000 g | PACK | Freq: Every day | ORAL | Status: DC

## 2023-12-20 MED ORDER — LACTATED RINGERS IV SOLN: INTRAVENOUS | Status: AC

## 2023-12-20 MED ORDER — FAMOTIDINE 40 MG/5ML PO SUSR
40.0000 mg | Freq: Every day | ORAL | Status: DC
  Filled 2023-12-20: qty 5

## 2023-12-20 MED ORDER — GERHARDT'S BUTT CREAM
TOPICAL_CREAM | Freq: Two times a day (BID) | CUTANEOUS | Status: DC
  Filled 2023-12-20: qty 60

## 2023-12-20 NOTE — ED Notes (Addendum)
 Pt in bed, pt requests pain and nausea med, md notified, pt refused tylenol , states that she can't swallow a pill, pt requests to be changed, changed pt, foley appears to be properly in place, draining cloudy urine.  Repositioned pt

## 2023-12-20 NOTE — ED Notes (Signed)
 First nurse note: from [employer] for N/V, green bile in gastric tube, chronic pain, possible UTI (treated for UTI 2 weeks ago), and sleeping too much per husband.  Was given phenergan and hydrocodone  at facility at 1030am  HR 81, 95% RA, 116/54

## 2023-12-20 NOTE — ED Notes (Signed)
 Pt in bed, sig other at bedside, states that he is concerned pt has a uti, and would like the wound on her buttocks checked.  Turned pt onto L side, pt propped on pillows.  Pt has indwelling foley cath

## 2023-12-20 NOTE — ED Notes (Addendum)
 This RN informed RN on inpatient unit patient needs to be placed on low air loss mattress when she is moved upstairs per [employer] from wound care.

## 2023-12-20 NOTE — ED Triage Notes (Addendum)
 BIBEMS, coming from [facility]. Admitted to hospital with UTI and Dc'ed end of July. Pt is more lethargic than usual, N/V.  Started phenergan and hydrocodone  this week because of decubitus ulcer. GCS 15, bed-bound

## 2023-12-20 NOTE — H&P (Signed)
 History and Physical:    Ruth Mayo   FMW:[id number] DOB: 03/16/1961 DOA: 12/20/2023  Referring MD/provider: Dr. Suzanne PCP: Allen Lauraine CROME, PA-C   Patient coming from: Home  Chief Complaint: Increasing lethargy, somnolence over the past 3 days  History of Present Illness:   Ruth Mayo is an  63 y.o. female with medical history significant of CAD, s/p of CABG 04/2023 postop course complicated by wound dehiscence and associated sepsis, bedbound and development of sacral ulcer and sacral osteomyelitis, chronic indwelling Foley catheter, hypertension, hyperlipidemia, diabetes mellitus, hypothyroidism, depression with anxiety, obesity, anemia, chronic close anemia, s/p feeding tube placement (only use it for taking medications patient), who presents from [facility] with weakness, increased somnolence, less interactive.  Per husband notes I know when she gets this way she is getting an infection like she has had before.  Of note patient was recently discharged 10 days ago after treatment for similar presentation.  Patient's main concern is that she is cold.  Denies fevers or sweats as far as she is aware.  No abdominal pain.  She has chronic nausea that is without change.  No chest pain or shortness of breath.  She notes they give her hydrocodone  every 4 hours, she does not need to ask for it there.  She notes that they gave her IV Phenergan x 2 for her chronic nausea and was told that maybe her increasing somnolence might be from the Phenergan as well.  Patient also states that she has been depressed and is not feeling better on her medications.  She is hoping she can see a psychiatrist while she is in the hospital.  Notes that she has been asked to see somebody at her nursing home but they have not been able  to arrange it yet.  Denies any suicidal ideation or plans.  ED Course:  The patient was noted to be hypotensive and met sepsis criteria.  Workup revealed grossly  abnormal UA.  Her chronic indwelling Foley was changed out and repeat UA with clean Foley was also grossly abnormal.  Patient was started on sepsis protocol with 1 L bolus of NS and started on LR at 150.  She also received 1 dose of ceftriaxone .  CT was ordered to evaluate her known stage IV sacral decubitus with osteomyelitis.  ROS:   ROS   Review of Systems: As per HPI  Past Medical History:   Past Medical History:  Diagnosis Date   Anxiety    Arthritis    Bedbound    CAD, multiple vessel    s/p CABG 01/13/23   Depression    DM type 2 with diabetic peripheral neuropathy (HCC)    DM2 (diabetes mellitus, type 2) (HCC)    DVT (deep venous thrombosis) (HCC)    Gastrostomy tube dependent (HCC)    History of iron deficiency anemia    HLD (hyperlipidemia)    HTN (hypertension)    Hypothyroidism    Memory deficit after cerebral infarction    Migraines    Morbid obesity (HCC)    Pulmonary embolism (HCC)    Sacral decubitus ulcer    Sacral osteomyelitis (HCC)    Stroke (HCC)    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)     Past Surgical History:   Past Surgical History:  Procedure Laterality Date   CESAREAN SECTION     CORONARY ARTERY BYPASS GRAFT  01/13/2023   IR REMOVAL TUN CV CATH W/O FL  03/20/2023   TUBAL LIGATION      Social History:   Social History   Socioeconomic History   Marital status: Divorced    Spouse name: Cathlyn   Number of children: 1   Years of education: Not on file   Highest education level: Not on file  Occupational History   Not on file  Tobacco Use   Smoking status: Former    Current packs/day: 0.00    Average packs/day: 1 pack/day for 25.0 years (25.0 ttl pk-yrs)    Types: Cigarettes    Start date: 06/06/1978    Quit date: 06/07/2003    Years since quitting: 20.5   Smokeless tobacco: Never  Vaping Use   Vaping status: Never Used  Substance and Sexual Activity   Alcohol use: Yes    Comment: a couple  glasses wine a week   Drug use: No   Sexual activity: Not Currently    Birth control/protection: Surgical    Comment: BTL   Other Topics Concern   Not on file  Social History Narrative   Work - desk work - Location manager   Divorced - lives with son   Social Drivers of Health   Financial Resource Strain: Patient Unable To Answer (03/04/2023)   Received from [facility]   Overall Financial Resource Strain (CARDIA)    Difficulty of Paying Living Expenses: Patient unable to answer  Food Insecurity: No Food Insecurity (12/01/2023)   Hunger Vital Sign    Worried About Running Out of Food in the Last Year: Never true    Ran Out of Food in the Last Year: Never true  Transportation Needs: No Transportation Needs (12/01/2023)   PRAPARE - Administrator, Civil Service (Medical): No    Lack of Transportation (Non-Medical): No  Physical Activity: Insufficiently Active (07/23/2022)   Received from [facility]   Exercise Vital Sign    On average, how many days per week do you engage in moderate to strenuous exercise (like a brisk walk)?: 1 day    On average, how many minutes do you engage in exercise at this level?: 30 min  Stress: No Stress Concern Present (03/02/2023)   Received from [facility]   [facility] of Occupational Health - Occupational Stress Questionnaire    Feeling of Stress : Not at all  Recent Concern: Stress - Stress Concern Present (01/14/2023)   Received from [facility] of Occupational Health - Occupational Stress Questionnaire    Feeling of Stress : To some extent  Social Connections: Patient Unable To Answer (03/04/2023)   Received from [facility]   Social Connection and Isolation Panel    In a typical week, how many times do you talk on the phone with family, friends, or neighbors?: Patient unable to answer    How often do you get together with friends or relatives?: Patient unable to answer    How often do you  attend church or religious services?: Patient unable to answer    Do you belong to any clubs or organizations such as church groups, unions, fraternal or athletic groups, or school groups?: Patient unable to answer    How often do you attend meetings of the clubs or organizations you belong to?: Patient unable to answer    Are you married, widowed, divorced, separated, never married, or living with a partner?: Patient unable to answer  Intimate Partner Violence: Not At Risk (12/01/2023)   Humiliation, Afraid, Rape, and  Kick questionnaire    Fear of Current or Ex-Partner: No    Emotionally Abused: No    Physically Abused: No    Sexually Abused: No    Allergies   Patient has no known allergies.  Family history:   Family History  Problem Relation Age of Onset   Diabetes Mother    Diabetes Sister    Hypertension Sister    Thyroid  disease Sister    Diabetes Maternal Grandmother     Current Medications:   Prior to Admission medications   Medication Sig Start Date End Date Taking? Authorizing Provider  apixaban  (ELIQUIS ) 5 MG TABS tablet Take 5 mg by mouth 2 (two) times daily.    [provider]  atorvastatin  (LIPITOR) 80 MG tablet 80 mg by Enteral route daily.    [provider]  escitalopram  (LEXAPRO ) 20 MG tablet Take 20 mg by mouth daily.    [provider]  gabapentin  (NEURONTIN ) 300 MG capsule Take 3 capsules (900 mg total) by mouth 3 (three) times daily. 01/02/18   Weber, Lauraine CROME, PA-C  levothyroxine  (SYNTHROID , LEVOTHROID) 50 MCG tablet Take 1 tablet (50 mcg total) by mouth daily. Patient taking differently: Take 112 mcg by mouth daily. 03/13/18   Melonie Colonel, Mikel HERO, MD  LORazepam  (ATIVAN ) 1 MG tablet Take 1 tablet (1 mg total) by mouth every 8 (eight) hours as needed for anxiety. TAKE ONE TABLET BY MOUTH TWICE A DAY FOR ANXIETY 12/11/23   Laurita Pillion, MD  midodrine  (PROAMATINE ) 5 MG tablet Take 2 tablets (10 mg total) by mouth 3 (three) times daily  with meals. 12/11/23   Zhang, Dekui, MD  polyethylene glycol (MIRALAX  / GLYCOLAX ) 17 g packet Take 17 g by mouth daily. 12/11/23   Laurita Pillion, MD  sertraline  (ZOLOFT ) 100 MG tablet Take 100 mg by mouth daily. Patient not taking: Reported on 12/04/2023    [provider]  traMADol  (ULTRAM ) 50 MG tablet Take 1 tablet (50 mg total) by mouth every 12 (twelve) hours as needed for severe pain (pain score 7-10). 12/11/23   Laurita Pillion, MD    Physical Exam:   Vitals:   12/20/23 1300 12/20/23 1330 12/20/23 1430 12/20/23 1552  BP: (!) 86/58 123/60 (!) 106/54 (!) 116/58  Pulse: 84 78 82 80  Resp: 15 13 17 17   Temp:    (!) 97.5 F (36.4 C)  TempSrc:    Oral  SpO2: 97% 100% 98% 97%     Physical Exam: Blood pressure (!) 116/58, pulse 80, temperature (!) 97.5 F (36.4 C), temperature source Oral, resp. rate 17, SpO2 97%. Gen: Chronically ill-appearing female looking much older than stated age lying in bed with attentive husband at bedside Eyes: sclera anicteric, conjuctiva mildly injected bilaterally CVS: S1-S2, regulary, no gallops Respiratory:  decreased air entry likely secondary to decreased inspiratory effort GI: NABS, soft, NT  Neuro: A/O x 3 Psych: patient notes she is still depressed and is hoping to speak with a psychiatrist.  No suicidal ideation   Data Review:    Labs: Basic Metabolic Panel: Recent Labs  Lab 12/20/23 1245  NA 134*  K 4.0  CL 103  CO2 17*  GLUCOSE 69*  BUN <5*  CREATININE <0.30*  CALCIUM  9.1   Liver Function Tests: Recent Labs  Lab 12/20/23 1245  AST 33  ALT 11  ALKPHOS 151*  BILITOT 1.5*  PROT 5.4*  ALBUMIN 1.6*   No results for input(s): LIPASE, AMYLASE in the last 168 hours. No  results for input(s): AMMONIA in the last 168 hours. CBC: Recent Labs  Lab 12/20/23 1245  WBC 8.3  HGB 11.5*  HCT 35.5*  MCV 86.0  PLT 416*   Cardiac Enzymes: No results for input(s): CKTOTAL, CKMB, CKMBINDEX, TROPONINI in the last 168  hours.  BNP (last 3 results) No results for input(s): PROBNP in the last 8760 hours. CBG: No results for input(s): GLUCAP in the last 168 hours.  Urinalysis    Component Value Date/Time   COLORURINE YELLOW (A) 12/20/2023 1353   APPEARANCEUR TURBID (A) 12/20/2023 1353   LABSPEC 1.018 12/20/2023 1353   PHURINE 5.0 12/20/2023 1353   GLUCOSEU NEGATIVE 12/20/2023 1353   HGBUR MODERATE (A) 12/20/2023 1353   BILIRUBINUR NEGATIVE 12/20/2023 1353   KETONESUR 20 (A) 12/20/2023 1353   PROTEINUR 100 (A) 12/20/2023 1353   NITRITE POSITIVE (A) 12/20/2023 1353   LEUKOCYTESUR MODERATE (A) 12/20/2023 1353      Radiographic Studies: No results found.  EKG: Independently reviewed.  Poor baseline.  Sinus rhythm at 88.  Diffusely flattened T waves.  Q in V1 and V2.  Poor R wave progression.   Assessment/Plan:   Principal Problem:   Complicated UTI (urinary tract infection)   Acute metabolic encephalopathy Complicated UTI/CAUTI Multiple nonobstructive bilateral renal stones Sepsis Patient with recurrent admission for complicated UTI. Now with improved mental status and BP with early fluid resuscitation and antibiotics  Urine culture and blood cultures have been ordered and sent in ED Continue ceftriaxone  2 g daily pending results of blood cultures Of note, patient has multiple bilateral nonobstructive renal stones, there is concerned that the stones might be infected.  Patient may benefit from chronic suppressive therapy.   Consider ID consult if warranted. Continue midodrine   Stage IV sacral decubitus Chronic osteomyelitis Bedbound status CT abdomen pelvis was ordered by ED to rule out acute osteomyelitis. Discharged with wound VAC last admission Wound consult placed  Anxiety and depression Patient states that she remains quite depressed and without motivation despite medications. Is requesting to see a psychiatrist while in house [facility] consult placed both for talk therapy  and medication reassessment In the meanwhile, continue Lexapro , Zoloft  and Ativan  per home doses Medication regimen was confirmed with pharmacy tech, she is on both Lexapro  and Zoloft  No evidence for serotonin syndrome at present, however would need to be followed closely  On Eliquis  for unclear indication Patient has been on Eliquis  for a while, patient and husband do not know why she is on it.  Patient denies history of DVT, is not sure why or when she was placed on it. PCP Lauraine Patient will need to be contacted to ascertain indication Meanwhile I will continue this medication  Minimal elevation in alk phos and bili Unclear clinical significance Will repeat with hydration  DM2 Not on any antidiabetes medication as an outpatient Will not start SSI  Hypothyroidism Continue Synthroid     Other information:   DVT prophylaxis: Eliquis  ordered. Code Status: Full Family Communication: Patient's husband was at bedside through Disposition Plan: SNF Consults called: [facility] Admission status: Inpatient  Naomii Kreger Tublu Raziah Funnell [facility]  If 7PM-7AM, please contact night-coverage [email or web address]

## 2023-12-20 NOTE — ED Notes (Signed)
 Pt in CT.

## 2023-12-20 NOTE — ED Notes (Signed)
 This RN spoke to NS on 1A to let her know patient will be transported to inpatient unit shortly.

## 2023-12-20 NOTE — Progress Notes (Signed)
 CODE SEPSIS - PHARMACY COMMUNICATION  **Broad Spectrum Antibiotics should be administered within 1 hour of Sepsis diagnosis**  Time Code Sepsis Called/Page Received: 1443  Antibiotics Ordered: ceftriaxone   Time of 1st antibiotic administration: 1457  Additional action taken by pharmacy: None  If necessary, Name of Provider/Nurse Contacted: None    Ruth Mayo ,PharmD Clinical Pharmacist  12/20/2023  2:40 PM

## 2023-12-20 NOTE — ED Notes (Signed)
 Iv therapy at bedside, attempt unsuccessful. Md notified of only one set of bc

## 2023-12-20 NOTE — Sepsis Progress Note (Signed)
 Elink following code sepsis

## 2023-12-20 NOTE — ED Provider Notes (Signed)
 [facility] Provider Note    Event Date/Time   First MD Initiated Contact with Patient 12/20/23 1240     (approximate)   History   Fatigue and Nausea   HPI  Ruth Mayo is a 63 y.o. female past 3 significant for CAD, chronic osteomyelitis of sacral wound, presents to the emergency department with generalized weakness and fatigue.  History is provided by the patient and husband at bedside.  States that over the past 2 to 3 days she has been sleeping much more frequently than she normally would.  She has been unable to work with physical therapy secondary to generalized weakness and fatigue.  States that this happens usually when she has an infection.  She has a chronic indwelling Foley catheter and it was last exchanged during her last hospitalization approximately 2 weeks ago.  She does not ambulate at baseline and states that physical therapy just work with her in bed and on the side of bed.  Patient denies any fever but does endorse generalized weakness and fatigue.  States that she recently had nausea, vomiting and diarrhea.  Denies any blood in her stool.     Physical Exam   Triage Vital Signs: ED Triage Vitals [12/20/23 1135]  Encounter Vitals Group     BP 105/69     Girls Systolic BP Percentile      Girls Diastolic BP Percentile      Boys Systolic BP Percentile      Boys Diastolic BP Percentile      Pulse Rate 88     Resp 18     Temp 97.6 F (36.4 C)     Temp Source Oral     SpO2 99 %     Weight      Height      Head Circumference      Peak Flow      Pain Score      Pain Loc      Pain Education      Exclude from Growth Chart     Most recent vital signs: Vitals:   12/20/23 1430 12/20/23 1552  BP: (!) 106/54 (!) 116/58  Pulse: 82 80  Resp: 17 17  Temp:  (!) 97.5 F (36.4 C)  SpO2: 98% 97%    Physical Exam Constitutional:      Appearance: She is well-developed. She is ill-appearing.  HENT:     Head: Atraumatic.  Eyes:      Conjunctiva/sclera: Conjunctivae normal.  Cardiovascular:     Rate and Rhythm: Regular rhythm.  Pulmonary:     Effort: No respiratory distress.  Abdominal:     General: There is no distension.     Tenderness: There is no abdominal tenderness.     Comments: Sacral wound, no surrounding induration or warmth.  See picture for further detail.  G-tube  Genitourinary:    Comments: Foley catheter in place Musculoskeletal:        General: Normal range of motion.     Cervical back: Normal range of motion.     Right lower leg: No edema.  Skin:    General: Skin is warm.     Capillary Refill: Capillary refill takes less than 2 seconds.  Neurological:     Mental Status: She is alert. Mental status is at baseline.     IMPRESSION / MDM / ASSESSMENT AND PLAN / ED COURSE  I reviewed the triage vital signs and the nursing notes.  On arrival patient  hypotensive.  Afebrile.  Concern for possible sepsis.  Difficult IV stick and delaying IV fluids and antibiotics secondary to needing an ultrasound-guided line.  Differential diagnosis including dehydration, electrolyte abnormality, urinary tract infection, sepsis, intra-abdominal pathology  EKG  I, Clotilda Punter, the attending physician, personally viewed and interpreted this ECG.  EKG showed significant underlying artifact.  Patient was tremulous at time of EKG.  Reading is prolonged QTc at 520 however difficult to see what her T wave is and disagree with prolonged QTc.  No significant ST elevation or depression.  Increased artifact when compared to prior  No tachycardic or bradycardic dysrhythmias while on cardiac telemetry.  RADIOLOGY CT scan abdomen and pelvis with contrast ordered LABS (all labs ordered are listed, but only abnormal results are displayed) Labs interpreted as -    Labs Reviewed  CBC - Abnormal; Notable for the following components:      Result Value   Hemoglobin 11.5 (*)    HCT 35.5 (*)    Platelets 416 (*)    All  other components within normal limits  COMPREHENSIVE METABOLIC PANEL WITH GFR - Abnormal; Notable for the following components:   Sodium 134 (*)    CO2 17 (*)    Glucose, Bld 69 (*)    BUN <5 (*)    Creatinine, Ser <0.30 (*)    Total Protein 5.4 (*)    Albumin 1.6 (*)    Alkaline Phosphatase 151 (*)    Total Bilirubin 1.5 (*)    All other components within normal limits  URINALYSIS, ROUTINE W REFLEX MICROSCOPIC - Abnormal; Notable for the following components:   Color, Urine YELLOW (*)    APPearance TURBID (*)    Hgb urine dipstick MODERATE (*)    Ketones, ur 20 (*)    Protein, ur 30 (*)    Nitrite POSITIVE (*)    Leukocytes,Ua LARGE (*)    Bacteria, UA FEW (*)    All other components within normal limits  URINALYSIS, W/ REFLEX TO CULTURE (INFECTION SUSPECTED) - Abnormal; Notable for the following components:   Color, Urine YELLOW (*)    APPearance TURBID (*)    Hgb urine dipstick MODERATE (*)    Ketones, ur 20 (*)    Protein, ur 100 (*)    Nitrite POSITIVE (*)    Leukocytes,Ua MODERATE (*)    Bacteria, UA MANY (*)    All other components within normal limits  CULTURE, BLOOD (SINGLE)  URINE CULTURE  CULTURE, BLOOD (SINGLE)  LACTIC ACID, PLASMA  LACTIC ACID, PLASMA  TROPONIN I (HIGH SENSITIVITY)  TROPONIN I (HIGH SENSITIVITY)     MDM  Patient found to have findings of urinary tract infection.  Indwelling Foley catheter was exchanged and repeat urinalysis was obtained from the new Foley catheter.  Continues to look infected with positive nitrites and leukocyte esterase.  On chart review do not see that she has a history of a resistant urinary tract infection no history of ESBL.  Discussed with pharmacy who recommended IV Rocephin .  Blood cultures obtained.  Given 1 L of IV fluids.  Felt that 30 cc/kg of IV fluids may be detrimental to the patient given good improvement of her blood pressure following 1 L of IV fluids.  Normal lactic acid.  CT scan abdomen and pelvis  currently pending.  Do not see any findings concerning for an infection for her chronic osteo of her sacral wound.  Consulted hospitalist for admission for dehydration, urinary tract infection and hypotension  PROCEDURES:  Critical Care performed: yes  .Critical Care  Performed by: Suzanne Kirsch, MD Authorized by: Suzanne Kirsch, MD   Critical care provider statement:    Critical care time (minutes):  45   Critical care time was exclusive of:  Separately billable procedures and treating other patients   Critical care was necessary to treat or prevent imminent or life-threatening deterioration of the following conditions:  Sepsis   Critical care was time spent personally by me on the following activities:  Development of treatment plan with patient or surrogate, discussions with consultants, evaluation of patient's response to treatment, examination of patient, ordering and review of laboratory studies, ordering and review of radiographic studies, ordering and performing treatments and interventions, pulse oximetry, re-evaluation of patient's condition and review of old charts   Care discussed with: admitting provider     Patient's presentation is most consistent with acute presentation with potential threat to life or bodily function.   MEDICATIONS ORDERED IN ED: Medications  lactated ringers  infusion (has no administration in time range)  lactated ringers  bolus 1,000 mL (1,000 mLs Intravenous New Bag/Given 12/20/23 1603)  iohexol  (OMNIPAQUE ) 300 MG/ML solution 75 mL (has no administration in time range)  sodium chloride  0.9 % bolus 1,000 mL (0 mLs Intravenous Stopped 12/20/23 1550)  cefTRIAXone  (ROCEPHIN ) 2 g in sodium chloride  0.9 % 100 mL IVPB (0 g Intravenous Stopped 12/20/23 1550)    FINAL CLINICAL IMPRESSION(S) / ED DIAGNOSES   Final diagnoses:  Weakness  Dehydration  Acute cystitis with hematuria     Rx / DC Orders   ED Discharge Orders     None         Note:  This document was prepared using Dragon voice recognition software and may include unintentional dictation errors.   Suzanne Kirsch, MD 12/20/23 [phone]

## 2023-12-20 NOTE — Consult Note (Signed)
 WOC Nurse Consult Note: patient well known to WOC team from previous admission; last seen 12/09/2023 and was utilizing NPWT when discharged to [facility]; wound evaluated by Dr. Jordis surgeon 12/02/2023 who noted good healthy granulation tissue and no need for any surgical intervention; photo documentation appears largely same as photo 12/03/2023  Reason for Consult: Stage 4 pressure injury sacrum  Wound type: Stage 4 Pressure Injury sacrum  Pressure Injury POA: Yes Measurement: see nursing flowsheet; last WOC measurement 12/09/2023 6 cm x 7 cm x 2.5 cm  Wound azi:jeezjmd healthy red, clean, small amount of tan fibrinous tissue noted  Drainage (amount, consistency, odor) no purulent drainage per MD note  Periwound: does appear to have moisture associated skin damage surrounding wound  Dressing procedure/placement/frequency: Cleanse sacral wound with Vashe wound cleanser Soila [phone]) do not rinse and allow to air dry.  Using a Q tip applicator apply Vashe moistened Kerlix to wound bed daily, cover with dry guaze and ABD pad and tape.    Will write for Gerhardt's Butt Cream 2 times daily and prn soiling for the [facility].   Patient should be placed on a low air loss mattress for pressure redistribution and moisture management.   POC discussed with bedside nurse. WOC team will not follow. Please reconsult if further needs arise.   Thank you,    Powell Bar MSN, RN-BC, [employer]

## 2023-12-21 DIAGNOSIS — F339 Major depressive disorder, recurrent, unspecified: Secondary | ICD-10-CM

## 2023-12-21 DIAGNOSIS — N390 Urinary tract infection, site not specified: Secondary | ICD-10-CM | POA: Diagnosis not present

## 2023-12-21 LAB — BLOOD CULTURE ID PANEL (REFLEXED) - BCID2

## 2023-12-21 LAB — COMPREHENSIVE METABOLIC PANEL WITH GFR
ALT: 11 U/L (ref 0–44)
AST: 26 U/L (ref 15–41)
Albumin: <1.5 g/dL — ABNORMAL LOW (ref 3.5–5.0)
Alkaline Phosphatase: 124 U/L (ref 38–126)
Anion gap: 10 (ref 5–15)
BUN: 5 mg/dL — ABNORMAL LOW (ref 6–20)
CO2: 23 mmol/L (ref 22–32)
Calcium: 8.6 mg/dL — ABNORMAL LOW (ref 8.9–10.3)
Chloride: 106 mmol/L (ref 98–111)
Creatinine, Ser: <0.3 mg/dL — ABNORMAL LOW (ref 0.44–1.00)
Glucose, Bld: 49 mg/dL — ABNORMAL LOW (ref 70–99)
Potassium: 2.4 mmol/L — CL (ref 3.5–5.1)
Sodium: 139 mmol/L (ref 135–145)
Total Bilirubin: 0.7 mg/dL (ref 0.0–1.2)
Total Protein: 4.3 g/dL — ABNORMAL LOW (ref 6.5–8.1)

## 2023-12-21 LAB — CBC
HCT: 29.4 % — ABNORMAL LOW (ref 36.0–46.0)
Hemoglobin: 9.3 g/dL — ABNORMAL LOW (ref 12.0–15.0)
MCH: 28.5 pg (ref 26.0–34.0)
MCHC: 31.6 g/dL (ref 30.0–36.0)
MCV: 90.2 fL (ref 80.0–100.0)
Platelets: 294 K/uL (ref 150–400)
RBC: 3.26 MIL/uL — ABNORMAL LOW (ref 3.87–5.11)
RDW: 15.2 % (ref 11.5–15.5)
WBC: 5 K/uL (ref 4.0–10.5)
nRBC: 0 % (ref 0.0–0.2)

## 2023-12-21 LAB — IRON AND TIBC: Iron: 38 ug/dL (ref 28–170)

## 2023-12-21 LAB — MAGNESIUM: Magnesium: 1.7 mg/dL (ref 1.7–2.4)

## 2023-12-21 LAB — FERRITIN: Ferritin: 269 ng/mL (ref 11–307)

## 2023-12-21 LAB — PHOSPHORUS: Phosphorus: 1.4 mg/dL — ABNORMAL LOW (ref 2.5–4.6)

## 2023-12-21 LAB — POTASSIUM: Potassium: 3.8 mmol/L (ref 3.5–5.1)

## 2023-12-21 MED ORDER — GABAPENTIN 250 MG/5ML PO SOLN
900.0000 mg | Freq: Three times a day (TID) | ORAL | Status: DC
  Administered 2023-12-21 – 2023-12-23 (×5): 900 mg
  Filled 2023-12-21 (×6): qty 18

## 2023-12-21 MED ORDER — BISACODYL 10 MG RE SUPP: 10.0000 mg | Freq: Every day | RECTAL | Status: DC | PRN

## 2023-12-21 MED ORDER — LORAZEPAM 1 MG PO TABS: 1.0000 mg | ORAL_TABLET | Freq: Two times a day (BID) | ORAL | Status: DC | PRN

## 2023-12-21 MED ORDER — TRAMADOL HCL 50 MG PO TABS
50.0000 mg | ORAL_TABLET | Freq: Two times a day (BID) | ORAL | Status: DC | PRN
  Administered 2023-12-21 – 2023-12-23 (×3): 50 mg
  Filled 2023-12-21 (×3): qty 1

## 2023-12-21 MED ORDER — POTASSIUM CHLORIDE 10 MEQ/100ML IV SOLN
10.0000 meq | INTRAVENOUS | Status: AC
  Administered 2023-12-21 (×4): 10 meq via INTRAVENOUS
  Filled 2023-12-21 (×4): qty 100

## 2023-12-21 MED ORDER — SENNOSIDES 8.8 MG/5ML PO SYRP
5.0000 mL | ORAL_SOLUTION | Freq: Every day | ORAL | Status: DC
  Filled 2023-12-21 (×2): qty 5

## 2023-12-21 MED ORDER — CHLORHEXIDINE GLUCONATE CLOTH 2 % EX PADS
6.0000 | MEDICATED_PAD | Freq: Every day | CUTANEOUS | Status: DC
  Administered 2023-12-21 – 2023-12-23 (×3): 6 via TOPICAL

## 2023-12-21 MED ORDER — MAGNESIUM SULFATE 2 GM/50ML IV SOLN
2.0000 g | Freq: Once | INTRAVENOUS | Status: AC
  Administered 2023-12-21: 2 g via INTRAVENOUS
  Filled 2023-12-21: qty 50

## 2023-12-21 MED ORDER — SODIUM PHOSPHATES 45 MMOLE/15ML IV SOLN
30.0000 mmol | Freq: Once | INTRAVENOUS | Status: AC
  Administered 2023-12-21: 30 mmol via INTRAVENOUS
  Filled 2023-12-21: qty 10

## 2023-12-21 MED ORDER — DOCUSATE SODIUM 50 MG/5ML PO LIQD
100.0000 mg | Freq: Every day | ORAL | Status: DC
  Filled 2023-12-21 (×2): qty 10

## 2023-12-21 MED ORDER — FAMOTIDINE 40 MG/5ML PO SUSR
40.0000 mg | Freq: Every day | ORAL | Status: DC
  Administered 2023-12-21 – 2023-12-22 (×2): 40 mg
  Filled 2023-12-21 (×2): qty 5

## 2023-12-21 MED ORDER — SERTRALINE HCL 50 MG PO TABS
50.0000 mg | ORAL_TABLET | Freq: Every day | ORAL | Status: DC
  Administered 2023-12-21 – 2023-12-23 (×3): 50 mg via ORAL
  Filled 2023-12-21 (×3): qty 1

## 2023-12-21 MED ORDER — POTASSIUM CHLORIDE 20 MEQ PO PACK
40.0000 meq | PACK | Freq: Once | ORAL | Status: AC
  Administered 2023-12-21: 40 meq
  Filled 2023-12-21: qty 2

## 2023-12-21 NOTE — Progress Notes (Signed)
 PHARMACY - PHYSICIAN COMMUNICATION CRITICAL VALUE ALERT - BLOOD CULTURE IDENTIFICATION (BCID)  Ruth Mayo is an 63 y.o. female who presented to [facility] on 12/20/2023 with a chief complaint of general weakness, increased somnolence and lethargy.  Assessment:  1/3 bottles growing staph epidermidis (MecA positive)   Name of physician (or Provider) Contacted: Dr. Jens  Current antibiotics: Ceftriaxone  2 g IV every 24 hours  Changes to prescribed antibiotics recommended: . Per MD, will consider blood culture result a contaminant and continue therapy with ceftriaxone  2 g IV every 24 hours  Results for orders placed or performed during the hospital encounter of 12/20/23  Blood Culture ID Panel (Reflexed) (Collected: 12/20/2023  1:54 PM)  Result Value Ref Range   Enterococcus faecalis NOT DETECTED NOT DETECTED   Enterococcus Faecium NOT DETECTED NOT DETECTED   Listeria monocytogenes NOT DETECTED NOT DETECTED   Staphylococcus species DETECTED (A) NOT DETECTED   Staphylococcus aureus (BCID) NOT DETECTED NOT DETECTED   Staphylococcus epidermidis DETECTED (A) NOT DETECTED   Staphylococcus lugdunensis NOT DETECTED NOT DETECTED   Streptococcus species NOT DETECTED NOT DETECTED   Streptococcus agalactiae NOT DETECTED NOT DETECTED   Streptococcus pneumoniae NOT DETECTED NOT DETECTED   Streptococcus pyogenes NOT DETECTED NOT DETECTED   A.calcoaceticus-baumannii NOT DETECTED NOT DETECTED   Bacteroides fragilis NOT DETECTED NOT DETECTED   Enterobacterales NOT DETECTED NOT DETECTED   Enterobacter cloacae complex NOT DETECTED NOT DETECTED   Escherichia coli NOT DETECTED NOT DETECTED   Klebsiella aerogenes NOT DETECTED NOT DETECTED   Klebsiella oxytoca NOT DETECTED NOT DETECTED   Klebsiella pneumoniae NOT DETECTED NOT DETECTED   Proteus species NOT DETECTED NOT DETECTED   Salmonella species NOT DETECTED NOT DETECTED   Serratia marcescens NOT DETECTED NOT DETECTED   Haemophilus influenzae NOT  DETECTED NOT DETECTED   Neisseria meningitidis NOT DETECTED NOT DETECTED   Pseudomonas aeruginosa NOT DETECTED NOT DETECTED   Stenotrophomonas maltophilia NOT DETECTED NOT DETECTED   Candida albicans NOT DETECTED NOT DETECTED   Candida auris NOT DETECTED NOT DETECTED   Candida glabrata NOT DETECTED NOT DETECTED   Candida krusei NOT DETECTED NOT DETECTED   Candida parapsilosis NOT DETECTED NOT DETECTED   Candida tropicalis NOT DETECTED NOT DETECTED   Cryptococcus neoformans/gattii NOT DETECTED NOT DETECTED   Methicillin resistance mecA/C DETECTED (A) NOT DETECTED   Thank you for involving pharmacy in this patient's care.   Damien Napoleon, PharmD Clinical Pharmacist 12/21/2023 4:36 PM

## 2023-12-21 NOTE — Consult Note (Signed)
 [facility] Psychiatric Consult Initial  Patient Name: .Ruth Mayo  MRN: [id number]  DOB: Jan 06, 1961  Consult Order details:  Orders (From admission, onward)     Start     Ordered   12/20/23 1758  IP CONSULT TO PSYCHIATRY       Ordering Provider: Dana Ivan Standing, MD  Provider:  (Not yet assigned)  Question Answer Comment  [facility]   Reason for Consult? Patient requesting to see a psychiatrist, on Lexapro , Zoloft  and Ativan  but still depressed and without motivation.  Not suicidal      12/20/23 1757             Mode of Visit: In person    Psychiatry Consult Evaluation  Service Date: December 21, 2023 LOS:  LOS: 1 day  Chief Complaint increased depression due to decline in health status  Primary Psychiatric Diagnoses  Depression  anxiety   Assessment  Ruth Mayo is a 63 y.o. female admitted: Medically  Hospital note: Ruth Mayo is an  63 y.o. female with medical history significant of CAD, s/p of CABG 04/2023 postop course complicated by wound dehiscence and associated sepsis, bedbound and development of sacral ulcer and sacral osteomyelitis, chronic indwelling Foley catheter, hypertension, hyperlipidemia, diabetes mellitus, hypothyroidism, depression with anxiety, obesity, anemia, chronic close anemia, s/p feeding tube placement (only use it for taking medications patient), who presents from [facility] with weakness, increased somnolence, less interactive.  Per husband notes I know when she gets this way she is getting an infection like she has had before.  Of note patient was recently discharged 10 days ago after treatment for similar presentation.   Patient's main concern is that she is cold.  Denies fevers or sweats as far as she is aware.  No abdominal pain.  She has chronic nausea that is without change.  No chest pain or shortness of breath.  She notes they give her hydrocodone  every 4 hours, she does  not need to ask for it there.  She notes that they gave her IV Phenergan x 2 for her chronic nausea and was told that maybe her increasing somnolence might be from the Phenergan as well.   Patient also states that she has been depressed and is not feeling better on her medications.  She is hoping she can see a psychiatrist while she is in the hospital.  Notes that she has been asked to see somebody at her nursing home but they have not been able  to arrange it yet.  Denies any suicidal ideation or plans.   ED Course:  The patient was noted to be hypotensive and met sepsis criteria.  Workup revealed grossly abnormal UA.  Her chronic indwelling Foley was changed out and repeat UA with clean Foley was also grossly abnormal.  Patient was started on sepsis protocol with 1 L bolus of NS and started on LR at 150.  She also received 1 dose of ceftriaxone .  CT was ordered to evaluate her known stage IV sacral decubitus with osteomyelitis.  Consult note: Patient is a 63 year old white female with a significant medical history of multiple health comorbidities.  She also has history of anxiety and depression which she reports has worsened since her hospitalization when October 2024.  She is currently prescribed sertraline  and Lexapro  which had been verified by the pharmacy that she is taking concurrently and managed by her primary care provider Lauraine [facility] of [facility] in [place] .  She denies  any other previous medication trials.  She has been taking these medications for the past 9 months.  Patient reports in September 2024 she had a triple bypass and her health began to decline.  Currently denies suicidal or homicidal ideation.  There is no history of suicide attempt or self-harm.  She does however endorse depressed mood, feeling down, and at times hopeless.  Currently lives with spouse who is her main caregiver.  Review of orders appears that her sertraline  was discontinued on  admission.   Diagnoses:  Active Hospital problems: Principal Problem:   Complicated UTI (urinary tract infection)    Plan   ## Psychiatric Medication Recommendations:  Stop the escitalopram  start sertraline  50 mg daily  ## Medical Decision Making Capacity: Not specifically addressed in this encounter  ## Further Work-up:   -- most recent EKG on December 20, 2023 had QtC of 520 -- Pertinent labwork reviewed earlier this admission includes: CBC and CMP, CT scans   ## Disposition:--Does not meet criteria for inpatient hospitalization will continue to follow her medically as needed  ## Behavioral / Environmental: -Utilize compassion and acknowledge the patient's experiences while setting clear and realistic expectations for care.    ## Safety and Observation Level:  - Based on my clinical evaluation, I estimate the patient to be at low risk of self harm in the current setting. - At this time, we recommend  routine. This decision is based on my review of the chart including patient's history and current presentation, interview of the patient, mental status examination, and consideration of suicide risk including evaluating suicidal ideation, plan, intent, suicidal or self-harm behaviors, risk factors, and protective factors. This judgment is based on our ability to directly address suicide risk, implement suicide prevention strategies, and develop a safety plan while the patient is in the clinical setting. Please contact our team if there is a concern that risk level has changed.  CSSR Risk Category:C-SSRS RISK CATEGORY: No Risk  Suicide Risk Assessment: Patient has following modifiable risk factors for suicide: pain, medical illness (ie new dx of cancer), which we are addressing by maximizing medication. Patient has following non-modifiable or demographic risk factors for suicide: None Patient has the following protective factors against suicide: Supportive family, no history of suicide  attempts, and no history of NSSIB  Thank you for this consult request. Recommendations have been communicated to the primary team.  We will continue to follow as needed at this time.   Sherica Paternostro B Joleene Burnham, NP       History of Present Illness  Relevant Aspects of [facility]   Patient Report:  Consult note: Patient is a 63 year old white female with a significant medical history of multiple health comorbidities.  She also has history of anxiety and depression which she reports has worsened since her hospitalization when October 2024.  She is currently prescribed sertraline  and Lexapro  which had been verified by the pharmacy that she is taking concurrently and managed by her primary care provider Laur[facility] in [place] .  She denies any other previous medication trials.  She has been taking these medications for the past 9 months.  Patient reports in September 2024 she had a triple bypass and her health began to decline.  Currently denies suicidal or homicidal ideation.  There is no history of suicide attempt or self-harm.  She does however endorse depressed mood, feeling down, and at times hopeless.  Currently lives with spouse who is her main caregiver.  Review  of orders appears that her sertraline  was discontinued on admission.  Psych ROS:  Depression: Endorses Anxiety: Endorses Mania (lifetime and current): Denies Psychosis: (lifetime and current): Denies  Collateral information:  No collateral available at this time    Psychiatric and Social History  Psychiatric History:  Information collected from patient and chart  Prev Dx/Sx: Anxiety and depression Current Psych Provider: Lauraine Patient, PCP Home Meds (current): Sertraline  100 mg Lexapro  20 mg Previous Med Trials: None Therapy: None  Prior Psych Hospitalization: Denies Prior Self Harm: Denies Prior Violence: Denies  Family Psych History: Denies Family Hx suicide: Denies  Social History:    Educational Hx: High school Occupational Hx: Disabled Legal Hx: Denies Living Situation: With spouse Spiritual Hx: Christian Access to weapons/lethal means: Denies  Substance History Alcohol: Denies Type of alcohol denies Last Drink denies Number of drinks per day denies History of alcohol withdrawal seizures denies History of DT's denies Tobacco: Denies Illicit drugs: Denies Prescription drug abuse: Denies Rehab hx: Denies  Exam Findings  Physical Exam: I have reviewed and agree with initial provider exam Vital Signs:  Temp:  [97.4 F (36.3 C)-98.1 F (36.7 C)] 97.8 F (36.6 C) (08/17 1230) Pulse Rate:  [73-86] 86 (08/17 1230) Resp:  [13-19] 18 (08/17 1230) BP: (84-132)/(41-74) 132/64 (08/17 1230) SpO2:  [90 %-100 %] 98 % (08/17 1230) Blood pressure 132/64, pulse 86, temperature 97.8 F (36.6 C), resp. rate 18, SpO2 98%. There is no height or weight on file to calculate BMI.    Mental Status Exam: General Appearance: Fairly Groomed  Orientation:  Full (Time, Place, and Person)  Memory:.  Good  Concentration: Good  Recall:  Good  Attention  Good  Eye Contact:  Minimal  Speech:  Normal Rate  Language:  Good  Volume:  Normal  Mood: Sad  Affect:  Appropriate  Thought Process:  Coherent and Linear  Thought Content:  WDL  Suicidal Thoughts:  No  Homicidal Thoughts:  No  Judgement:  Good  Insight:  Good  Psychomotor Activity:  Normal  Akathisia:  No  Fund of Knowledge:  Good      Assets:  Communication Skills Desire for Improvement Social Support  Cognition:  WNL  ADL's:  Impaired  AIMS (if indicated):        Other History   These have been pulled in through the EMR, reviewed, and updated if appropriate.  Family History:  The patient's family history includes Diabetes in her maternal grandmother, mother, and sister; Hypertension in her sister; Thyroid  disease in her sister.  Medical History: Past Medical History:  Diagnosis Date   Anxiety     Arthritis    Bedbound    CAD, multiple vessel    s/p CABG 01/13/23   Depression    DM type 2 with diabetic peripheral neuropathy (HCC)    DM2 (diabetes mellitus, type 2) (HCC)    DVT (deep venous thrombosis) (HCC)    Gastrostomy tube dependent (HCC)    History of iron deficiency anemia    HLD (hyperlipidemia)    HTN (hypertension)    Hypothyroidism    Memory deficit after cerebral infarction    Migraines    Morbid obesity (HCC)    Pulmonary embolism (HCC)    Sacral decubitus ulcer    Sacral osteomyelitis (HCC)    Stroke (HCC)    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)     Surgical History: Past Surgical History:  Procedure Laterality Date   CESAREAN  SECTION     CORONARY ARTERY BYPASS GRAFT  01/13/2023   IR REMOVAL TUN CV CATH W/O FL  03/20/2023   TUBAL LIGATION       Medications:   Current Facility-Administered Medications:    acetaminophen  (TYLENOL ) tablet 1,000 mg, 1,000 mg, Oral, Once, Chatterjee, Srobona Tublu, MD   apixaban  (ELIQUIS ) tablet 5 mg, 5 mg, Per Tube, BID, Chatterjee, Srobona Tublu, MD, 5 mg at 12/21/23 9153   atorvastatin  (LIPITOR) tablet 80 mg, 80 mg, Per Tube, Daily, Chatterjee, Srobona Tublu, MD, 80 mg at 12/21/23 0846   bisacodyl  (DULCOLAX) EC tablet 5 mg, 5 mg, Oral, Daily PRN, Chatterjee, Srobona Tublu, MD   cefTRIAXone  (ROCEPHIN ) 2 g in sodium chloride  0.9 % 100 mL IVPB, 2 g, Intravenous, Q24H, Chatterjee, Srobona Tublu, MD   Chlorhexidine  Gluconate Cloth 2 % PADS 6 each, 6 each, Topical, Daily, Ayiku, Bernard, MD, 6 each at 12/21/23 1308   escitalopram  (LEXAPRO ) tablet 20 mg, 20 mg, Per Tube, Daily, Chatterjee, Srobona Tublu, MD, 20 mg at 12/21/23 0846   famotidine  (PEPCID ) tablet 40 mg, 40 mg, Per Tube, QHS, Chatterjee, Srobona Tublu, MD, 40 mg at 12/20/23 2155   gabapentin  (NEURONTIN ) capsule 900 mg, 900 mg, Per Tube, TID, Chatterjee, Srobona Tublu, MD, 900 mg at 12/21/23 0846   Gerhardt's butt cream, , Topical,  BID, Chatterjee, Srobona Tublu, MD, Given at 12/21/23 0856   guaiFENesin  (ROBITUSSIN) 100 MG/5ML liquid 10 mL, 10 mL, Per Tube, BID, Chatterjee, Srobona Tublu, MD, 10 mL at 12/21/23 0845   HYDROcodone -acetaminophen  (NORCO/VICODIN) 5-325 MG per tablet 1 tablet, 1 tablet, Per Tube, Q4H PRN, Dana Ivan Standing, MD, 1 tablet at 12/21/23 1219   levothyroxine  (SYNTHROID ) tablet 50 mcg, 50 mcg, Per Tube, Daily, Chatterjee, Srobona Tublu, MD, 50 mcg at 12/21/23 9461   LORazepam  (ATIVAN ) tablet 1 mg, 1 mg, Oral, Q12H PRN, Chatterjee, Srobona Tublu, MD   midodrine  (PROAMATINE ) tablet 10 mg, 10 mg, Per Tube, Q breakfast, Chatterjee, Srobona Tublu, MD, 10 mg at 12/21/23 0846   nystatin  (MYCOSTATIN /NYSTOP ) topical powder 1 Application, 1 Application, Topical, BID, Dana Ivan Tublu, MD, 1 Application at 12/21/23 9143   ondansetron  (ZOFRAN -ODT) disintegrating tablet 4 mg, 4 mg, Oral, Q8H PRN, Chatterjee, Srobona Tublu, MD   polyethylene glycol (MIRALAX  / GLYCOLAX ) packet 17 g, 17 g, Per Tube, Daily, Chatterjee, Srobona Tublu, MD, 17 g at 12/21/23 0847   senna-docusate (Senokot-S) tablet 1 tablet, 1 tablet, Per Tube, QHS, Dana Ivan Tublu, MD, 1 tablet at 12/20/23 2155   sodium phosphate  30 mmol in sodium chloride  0.9 % 250 mL infusion, 30 mmol, Intravenous, Once, Jens Durand, MD   traMADol  (ULTRAM ) tablet 50 mg, 50 mg, Oral, Q12H PRN, Chatterjee, Srobona Tublu, MD, 50 mg at 12/21/23 9462  Allergies: No Known Allergies  Daine KATHEE Ober, NP

## 2023-12-21 NOTE — Progress Notes (Signed)
 PHARMACY CONSULT NOTE - ELECTROLYTES  Pharmacy Consult for Electrolyte Monitoring and Replacement   Recent Labs:   CrCl cannot be calculated (This lab value cannot be used to calculate CrCl because it is not a number: <0.30). Potassium (mmol/L)  Date Value  12/21/2023 3.8   Magnesium  (mg/dL)  Date Value  91/82/7974 1.7   Calcium  (mg/dL)  Date Value  91/82/7974 8.6 (L)   Calcium , Total (PTH) (mg/dL)  Date Value  89/70/7975 9.7   Albumin (g/dL)  Date Value  91/82/7974 <1.5 (L)  01/02/2018 3.8   Phosphorus (mg/dL)  Date Value  91/82/7974 1.4 (L)   Sodium (mmol/L)  Date Value  12/21/2023 139  01/02/2018 137   Corrected Ca: 10.6 mg/dL  Assessment  Ruth Mayo is a 63 y.o. female presenting with general weakness, increased somnolence and lethargy. PMH significant for CAD, s/p of CABG 04/2023, hypertension, hyperlipidemia, diabetes mellitus, hypothyroidism, depression with anxiety, obesity, anemia, dysphagia s/p feeding tube placement. Pharmacy has been consulted to monitor and replace electrolytes.  Diet: heart healthy MIVF: N/A Pertinent medications: N/A  Goal of Therapy: Electrolytes WNL  Plan:  Mg 1.7: provider has ordered magnesium  sulfate 2 g IV x1 Phos 1.4: Provider has ordered sodium phosphate  30 mmol IV x1 Check BMP, Mg, Phos with AM labs  Thank you for allowing pharmacy to be a part of this patient's care.  Damien Napoleon, PharmD Clinical Pharmacist 12/21/2023 3:37 PM

## 2023-12-21 NOTE — Progress Notes (Addendum)
 Progress Note    Ruth Mayo  FMW:[id number] DOB: 1960/06/09  DOA: 12/20/2023 PCP: Allen Lauraine CROME, PA-C      Brief Narrative:    Medical records reviewed and are as summarized below:  Ruth Mayo is a 63 y.o. female with medical history significant of CAD, s/p of CABG 04/2023 postop course complicated by wound dehiscence and associated sepsis, bedbound and development of sacral ulcer and sacral osteomyelitis, chronic indwelling Foley catheter, hypertension, hyperlipidemia, diabetes mellitus, hypothyroidism, depression with anxiety, obesity, anemia, dysphagia s/p feeding tube placement (but only used for medications).  She was recently discharged from the hospital on 12/11/2023 for complicated UTI due to E. coli.  She presented from [facility] nursing home to the ED with general weakness, increased somnolence and lethargy.     Assessment/Plan:   Principal Problem:   Complicated UTI (urinary tract infection) Active Problems:   Hypokalemia   Hypophosphatemia   Episode of recurrent major depressive disorder (HCC)   Hypomagnesemia   There is no height or weight on file to calculate BMI.   Acute complicated UTI in the setting of chronic indwelling Foley catheter: Continue IV ceftriaxone . He has multiple nonobstructive bilateral renal calculi Recent urine culture from 12/01/2023 showed pansensitive E. coli.   Hypotension: Improved S/p treatment with IV fluids. Continue midodrine .   History of pulmonary embolism December 2024: Continue Eliquis     Stage IV sacral decubitus ulcer, chronic osteomyelitis of sacral wound, chronic bedbound status: Present on admission.  Analgesics as needed for pain. Continue local wound care.  Follow-up with wound care nurse.    Hypokalemia: Replete potassium intravenously via gastrostomy tube. Hypophosphatemia: Replete phosphorus with IV sodium phosphate . Hypomagnesemia: Replete with IV magnesium  sulfate  Severe hypoalbuminemia:  Albumin less than 1.5.  Encourage adequate oral intake.  Consult dietitian.   Comorbidities include chronic anemia, type II DM, hypothyroidism, anxiety, depression  She was evaluated by the psychiatrist.  Escitalopram  discontinued and has been started on sertraline .    Diet Order             Diet heart healthy/carb modified Room service appropriate? Yes; Fluid consistency: Thin  Diet effective now                                  Consultants: None  Procedures: None    Medications:    acetaminophen   1,000 mg Oral Once   apixaban   5 mg Per Tube BID   atorvastatin   80 mg Per Tube Daily   Chlorhexidine  Gluconate Cloth  6 each Topical Daily   famotidine   40 mg Per Tube QHS   gabapentin   900 mg Per Tube TID   Gerhardt's butt cream   Topical BID   guaiFENesin   10 mL Per Tube BID   levothyroxine   50 mcg Per Tube Daily   midodrine   10 mg Per Tube Q breakfast   nystatin   1 Application Topical BID   polyethylene glycol  17 g Per Tube Daily   senna-docusate  1 tablet Per Tube QHS   sertraline   50 mg Oral Daily   Continuous Infusions:  cefTRIAXone  (ROCEPHIN )  IV 2 g (12/21/23 1516)   magnesium  sulfate bolus IVPB     sodium PHOSPHATE  IVPB (in mmol)       Anti-infectives (From admission, onward)    Start     Dose/Rate Route Frequency Ordered Stop   12/21/23 1500  cefTRIAXone  (ROCEPHIN ) 2 g  in sodium chloride  0.9 % 100 mL IVPB        2 g 200 mL/hr over 30 Minutes Intravenous Every 24 hours 12/20/23 1751     12/20/23 1445  cefTRIAXone  (ROCEPHIN ) 2 g in sodium chloride  0.9 % 100 mL IVPB        2 g 200 mL/hr over 30 Minutes Intravenous Once 12/20/23 1437 12/20/23 1550              Family Communication/Anticipated D/C date and plan/Code Status   DVT prophylaxis:  apixaban  (ELIQUIS ) tablet 5 mg     Code Status: Full Code  Family Communication: Plan discussed with her husband at bedside Disposition Plan: Plan to discharge to SNF   Status  is: Inpatient Remains inpatient appropriate because: Acute UTI       Subjective:   Interval events noted.  She complains of feeling lethargic.  Her husband was at the bedside.  Objective:    Vitals:   12/21/23 0022 12/21/23 0522 12/21/23 0736 12/21/23 1230  BP: 104/60 (!) 104/44 (!) 102/53 132/64  Pulse: 73 74 86 86  Resp:  16 18 18   Temp: (!) 97.4 F (36.3 C) 97.8 F (36.6 C) 98.1 F (36.7 C) 97.8 F (36.6 C)  TempSrc: Oral Oral    SpO2: 100% 98% 99% 98%   No data found.   Intake/Output Summary (Last 24 hours) at 12/21/2023 1529 Last data filed at 12/21/2023 1300 Gross per 24 hour  Intake 3904.15 ml  Output 950 ml  Net 2954.15 ml   There were no vitals filed for this visit.  Exam:  GEN: NAD SKIN: Warm and dry EYES: No pallor or icterus ENT: MMM CV: RRR PULM: CTA B ABD: soft, ND, NT, +BS, +GT CNS: AAO x 3, non focal EXT: Bilateral leg edema, no erythema or tenderness        Data Reviewed:   I have personally reviewed following labs and imaging studies:  Labs: Labs show the following:   Basic Metabolic Panel: Recent Labs  Lab 12/20/23 1245 12/21/23 0601  NA 134* 139  K 4.0 2.4*  CL 103 106  CO2 17* 23  GLUCOSE 69* 49*  BUN <5* 5*  CREATININE <0.30* <0.30*  CALCIUM  9.1 8.6*  MG  --  1.7  PHOS  --  1.4*   GFR CrCl cannot be calculated (This lab value cannot be used to calculate CrCl because it is not a number: <0.30). Liver Function Tests: Recent Labs  Lab 12/20/23 1245 12/21/23 0601  AST 33 26  ALT 11 11  ALKPHOS 151* 124  BILITOT 1.5* 0.7  PROT 5.4* 4.3*  ALBUMIN 1.6* <1.5*   No results for input(s): LIPASE, AMYLASE in the last 168 hours. No results for input(s): AMMONIA in the last 168 hours. Coagulation profile No results for input(s): INR, PROTIME in the last 168 hours.  CBC: Recent Labs  Lab 12/20/23 1245 12/21/23 0601  WBC 8.3 5.0  HGB 11.5* 9.3*  HCT 35.5* 29.4*  MCV 86.0 90.2  PLT 416* 294    Cardiac Enzymes: No results for input(s): CKTOTAL, CKMB, CKMBINDEX, TROPONINI in the last 168 hours. BNP (last 3 results) No results for input(s): PROBNP in the last 8760 hours. CBG: No results for input(s): GLUCAP in the last 168 hours. D-Dimer: No results for input(s): DDIMER in the last 72 hours. Hgb A1c: No results for input(s): HGBA1C in the last 72 hours. Lipid Profile: No results for input(s): CHOL, HDL, LDLCALC, TRIG, CHOLHDL, LDLDIRECT in  the last 72 hours. Thyroid  function studies: No results for input(s): TSH, T4TOTAL, T3FREE, THYROIDAB in the last 72 hours.  Invalid input(s): FREET3 Anemia work up: No results for input(s): VITAMINB12, FOLATE, FERRITIN, TIBC, IRON, RETICCTPCT in the last 72 hours. Sepsis Labs: Recent Labs  Lab 12/20/23 1245 12/20/23 1354 12/20/23 1355 12/21/23 0601  WBC 8.3  --   --  5.0  LATICACIDVEN  --  1.2 1.3  --     Microbiology Recent Results (from the past 240 hours)  Blood culture (routine single)     Status: None (Preliminary result)   Collection Time: 12/20/23  1:54 PM   Specimen: BLOOD  Result Value Ref Range Status   Specimen Description BLOOD RIGHT ANTECUBITAL  Final   Special Requests   Final    BOTTLES DRAWN AEROBIC AND ANAEROBIC Blood Culture adequate volume   Culture  Setup Time   Final    GRAM POSITIVE COCCI AEROBIC BOTTLE ONLY Organism ID to follow Performed at [facility], [address] [place], [place] [address]    Culture [facility] POSITIVE COCCI  Final   Report Status PENDING  Incomplete    Procedures and diagnostic studies:  CT ABDOMEN PELVIS W CONTRAST Addendum Date: 12/20/2023 ADDENDUM REPORT: 12/20/2023 19:16 ADDENDUM: Additional impression. Similar slightly nodular appearing airspace opacity in the left lung base, could be due to chronic aspiration but given lack of resolution and no significant change in appearance, some concern raised for  neoplasm. Suggest pulmonary consultation. Electronically Signed   By: Luke Bun M.D.   On: 12/20/2023 19:16   Result Date: 12/20/2023 CLINICAL DATA:  Abdominal pain nausea vomiting diarrhea EXAM: CT ABDOMEN AND PELVIS WITH CONTRAST TECHNIQUE: Multidetector CT imaging of the abdomen and pelvis was performed using the standard protocol following bolus administration of intravenous contrast. RADIATION DOSE REDUCTION: This exam was performed according to the departmental dose-optimization program which includes automated exposure control, adjustment of the mA and/or kV according to patient size and/or use of iterative reconstruction technique. CONTRAST:  75mL OMNIPAQUE  IOHEXOL  300 MG/ML  SOLN COMPARISON:  CT 12/01/2023, 10/17/2023 FINDINGS: Lower chest: Lower sternotomy. Bandlike density in the right posterior lung base suggestive of scarring. Slightly nodular appearing airspace disease within the left lung base, no change in appearance or configuration since June CT. Coronary vascular calcification. Hepatobiliary: No calcified gallstones.  No biliary distension. Pancreas: No ductal dilatation. Slightly indistinct appearance of peripancreatic fat planes at the head. Suspicion of small volume fluid between the duodenum and head of pancreas, series 2, image 32. Spleen: Normal in size without focal abnormality. Adrenals/Urinary Tract: Adrenal glands are normal. Kidneys show no hydronephrosis. Punctate nonobstructing bilateral kidney stones. Catheter in the bladder. Bladder appears diffusely thick walled with mild mucosal enhancement. Stomach/Bowel: Gastrostomy tube in the stomach. No dilated small bowel. No acute bowel wall thickening. Large stool burden with moderate rectal distension by feces. Vascular/Lymphatic: Advanced aortic atherosclerosis. No aneurysm. No suspicious lymph nodes Reproductive: Uterus and bilateral adnexa are unremarkable. Other: Negative for free air or ascites. Small volume gas within the  subcutaneous soft tissues of the lower anterior abdominal wall. Musculoskeletal: Deep sacral decubitus ulcer extending to the bone. Erosive changes at the sacrococcygeal region, grossly similar compared with 7/28 exam consistent with osteomyelitis. Residual presacral soft tissue stranding but appears slightly improved compared with 12/01/2023. Slight decreased size of small rim enhancing fluid collection within the right medial gluteal region, this measures 13 x 11 mm previously 17 x 17 mm. IMPRESSION: 1. Slightly indistinct appearance  of peripancreatic fat planes at the head of pancreas with suspicion of small volume fluid between the duodenum and head of pancreas, suggest correlation with pancreatic enzymes to exclude pancreatitis. Duodenitis could also be considered. 2. Large stool burden with moderate rectal distension by feces. 3. Deep sacral decubitus ulcer extending to the bone with erosive changes at the sacrococcygeal region consistent with osteomyelitis, grossly stable compared with 12/01/2023 exam. Residual presacral soft tissue stranding but appears slightly improved compared with 12/01/2023. Slight decreased size of small rim enhancing fluid collection within the right medial gluteal region. 4. Punctate nonobstructing bilateral kidney stones. 5. Aortic atherosclerosis. Aortic Atherosclerosis (ICD10-I70.0). Electronically Signed: By: Luke Bun M.D. On: 12/20/2023 18:50               LOS: 1 day   Vittorio Mohs  [facility]   Pager on [email or web address]. If 7PM-7AM, please contact night-coverage at [email or web address]     12/21/2023, 3:29 PM

## 2023-12-22 DIAGNOSIS — F32A Depression, unspecified: Secondary | ICD-10-CM

## 2023-12-22 DIAGNOSIS — N390 Urinary tract infection, site not specified: Secondary | ICD-10-CM | POA: Diagnosis not present

## 2023-12-22 DIAGNOSIS — F419 Anxiety disorder, unspecified: Secondary | ICD-10-CM | POA: Diagnosis not present

## 2023-12-22 LAB — RENAL FUNCTION PANEL
Albumin: <1.5 g/dL — ABNORMAL LOW (ref 3.5–5.0)
Anion gap: 7 (ref 5–15)
BUN: <5 mg/dL — ABNORMAL LOW (ref 6–20)
CO2: 22 mmol/L (ref 22–32)
Calcium: 8.1 mg/dL — ABNORMAL LOW (ref 8.9–10.3)
Chloride: 108 mmol/L (ref 98–111)
Creatinine, Ser: <0.3 mg/dL — ABNORMAL LOW (ref 0.44–1.00)
Glucose, Bld: 47 mg/dL — ABNORMAL LOW (ref 70–99)
Phosphorus: 2.9 mg/dL (ref 2.5–4.6)
Potassium: 3.4 mmol/L — ABNORMAL LOW (ref 3.5–5.1)
Sodium: 137 mmol/L (ref 135–145)

## 2023-12-22 LAB — CULTURE, BLOOD (SINGLE): Special Requests: ADEQUATE

## 2023-12-22 LAB — MAGNESIUM: Magnesium: 1.9 mg/dL (ref 1.7–2.4)

## 2023-12-22 MED ORDER — POTASSIUM CHLORIDE CRYS ER 20 MEQ PO TBCR
20.0000 meq | EXTENDED_RELEASE_TABLET | Freq: Once | ORAL | Status: AC
  Administered 2023-12-22: 20 meq via ORAL
  Filled 2023-12-22: qty 1

## 2023-12-22 MED ORDER — ADULT MULTIVITAMIN W/MINERALS CH
1.0000 | ORAL_TABLET | Freq: Every day | ORAL | Status: DC
  Administered 2023-12-22 – 2023-12-23 (×2): 1 via ORAL
  Filled 2023-12-22 (×2): qty 1

## 2023-12-22 MED ORDER — ZINC SULFATE 220 (50 ZN) MG PO CAPS
220.0000 mg | ORAL_CAPSULE | Freq: Every day | ORAL | Status: DC
  Administered 2023-12-22 – 2023-12-23 (×2): 220 mg via ORAL
  Filled 2023-12-22 (×2): qty 1

## 2023-12-22 MED ORDER — ENSURE PLUS HIGH PROTEIN PO LIQD
237.0000 mL | Freq: Three times a day (TID) | ORAL | Status: DC
  Administered 2023-12-22 – 2023-12-23 (×3): 237 mL via ORAL

## 2023-12-22 MED ORDER — CIPROFLOXACIN HCL 500 MG PO TABS
500.0000 mg | ORAL_TABLET | Freq: Two times a day (BID) | ORAL | Status: DC
  Administered 2023-12-22 – 2023-12-23 (×2): 500 mg
  Filled 2023-12-22 (×2): qty 1

## 2023-12-22 MED ORDER — VITAMIN C 500 MG PO TABS
500.0000 mg | ORAL_TABLET | Freq: Two times a day (BID) | ORAL | Status: DC
  Administered 2023-12-22 – 2023-12-23 (×3): 500 mg via ORAL
  Filled 2023-12-22 (×3): qty 1

## 2023-12-22 MED ORDER — CIPROFLOXACIN HCL 500 MG PO TABS: 500.0000 mg | ORAL_TABLET | Freq: Two times a day (BID) | ORAL | Status: DC

## 2023-12-22 NOTE — Consult Note (Signed)
 [facility] Psychiatric Consult Follow up  Patient Name: .Ruth Mayo  MRN: [id number]  DOB: 1961-02-21  Consult Order details:  Orders (From admission, onward)     Start     Ordered   12/20/23 1758  IP CONSULT TO PSYCHIATRY       Ordering Provider: Dana Ivan Standing, MD  Provider:  (Not yet assigned)  Question Answer Comment  Location [facility]   Reason for Consult? Patient requesting to see a psychiatrist, on Lexapro , Zoloft  and Ativan  but still depressed and without motivation.  Not suicidal      12/20/23 1757             Mode of Visit: In person    Psychiatry Consult Evaluation  Service Date: December 22, 2023 LOS:  LOS: 2 days  Chief Complaint increased depression due to decline in health status  Primary Psychiatric Diagnoses  Depression  anxiety   Assessment  Ruth Mayo is a 63 y.o. female admitted: Ruth Mayo is a 63 y.o. female with medical history significant of CAD, s/p of CABG 04/2023 postop course complicated by wound dehiscence and associated sepsis, bedbound and development of sacral ulcer and sacral osteomyelitis, chronic indwelling Foley catheter, hypertension, hyperlipidemia, diabetes mellitus, hypothyroidism, depression with anxiety, obesity, anemia, dysphagia s/p feeding tube placement (but only used for medications).  She was recently discharged from the hospital on 12/11/2023 for complicated UTI due to E. coli.  She presented from [facility] nursing home to the ED with general weakness, increased somnolence and lethargy . Psychiatry is consulted to evaluate for depression/anxiety.  12/22/23: On assessment patient is able to show that she is oriented x 3 and is able to talk about her medical problems.  She is denying SI/HI/plan and is not displaying any hallucinations and is not responding to internal stimuli.  No indication of inpatient psychiatric admission at this time.  Patient is responding to medications  very well.  Patient is psychiatrically stable at this time.  Diagnoses:  Active Hospital problems: Principal Problem:   Complicated UTI (urinary tract infection) Active Problems:   Hypokalemia   Hypophosphatemia   Episode of recurrent major depressive disorder (HCC)   Hypomagnesemia    Plan   ## Psychiatric Medication Recommendations:  sertraline  50 mg daily  ## Medical Decision Making Capacity: Not specifically addressed in this encounter  ## Further Work-up:   -- most recent EKG on December 20, 2023 had QtC of 520 -- Pertinent labwork reviewed earlier this admission includes: CBC and CMP, CT scans   ## Disposition:--Does not meet criteria for inpatient hospitalization will continue to follow her medically as needed  ## Behavioral / Environmental: -Utilize compassion and acknowledge the patient's experiences while setting clear and realistic expectations for care.    ## Safety and Observation Level:  - Based on my clinical evaluation, I estimate the patient to be at low risk of self harm in the current setting. - At this time, we recommend  routine. This decision is based on my review of the chart including patient's history and current presentation, interview of the patient, mental status examination, and consideration of suicide risk including evaluating suicidal ideation, plan, intent, suicidal or self-harm behaviors, risk factors, and protective factors. This judgment is based on our ability to directly address suicide risk, implement suicide prevention strategies, and develop a safety plan while the patient is in the clinical setting. Please contact our team if there is a concern that risk level has changed.  CSSR Risk  Category:C-SSRS RISK CATEGORY: No Risk  Suicide Risk Assessment: Patient has following modifiable risk factors for suicide: pain, medical illness (ie new dx of cancer), which we are addressing by maximizing medication. Patient has following non-modifiable or  demographic risk factors for suicide: None Patient has the following protective factors against suicide: Supportive family, no history of suicide attempts, and no history of NSSIB  Thank you for this consult request. Recommendations have been communicated to the primary team.  We will sign off  at this time.   Taim Wurm, MD       History of Present Illness  Relevant Aspects of [facility]   12/22/23: On assessment today patient is noted to be resting in bed.  She was able to answer the orientation questions as being at [facility] and the date is August 2025 and the president as Nancyann Frohlich.  She reports that she had UTI.  She denies having any confusion.  She denies hearing any voices or seeing things unusual.  She reports having history of anxiety.  She denies any current SI/HI/plan.  She is taking her medications with no reported side effects.     Psychiatric and Social History  Psychiatric History:  Information collected from patient and chart  Prev Dx/Sx: Anxiety and depression Current Psych Provider: Lauraine Patient, PCP Home Meds (current): Sertraline  100 mg Lexapro  20 mg Previous Med Trials: None Therapy: None  Prior Psych Hospitalization: Denies Prior Self Harm: Denies Prior Violence: Denies  Family Psych History: Denies Family Hx suicide: Denies  Social History:   Educational Hx: High school Occupational Hx: Disabled Legal Hx: Denies Living Situation: With spouse Spiritual Hx: Christian Access to weapons/lethal means: Denies  Substance History Alcohol: Denies Type of alcohol denies Last Drink denies Number of drinks per day denies History of alcohol withdrawal seizures denies History of DT's denies Tobacco: Denies Illicit drugs: Denies Prescription drug abuse: Denies Rehab hx: Denies  Exam Findings  Physical Exam: I have reviewed and agree with initial provider exam Vital Signs:  Temp:  [97.8 F (36.6 C)-98.5 F (36.9 C)] 97.8 F (36.6 C) (08/18  1124) Pulse Rate:  [66-86] 79 (08/18 1124) Resp:  [14-18] 18 (08/18 1124) BP: (83-102)/(41-54) 102/44 (08/18 1124) SpO2:  [96 %-99 %] 97 % (08/18 1124) Blood pressure (!) 102/44, pulse 79, temperature 97.8 F (36.6 C), resp. rate 18, SpO2 97%. There is no height or weight on file to calculate BMI.    Mental Status Exam: General Appearance: Fairly Groomed  Orientation:  Full (Time, Place, and Person)  Memory:.  Good  Concentration: Good  Recall:  Good  Attention  Good  Eye Contact:  Minimal  Speech:  Normal Rate  Language:  Good  Volume:  Normal  Mood: Sad  Affect:  Appropriate  Thought Process:  Coherent and Linear  Thought Content:  WDL  Suicidal Thoughts:  No  Homicidal Thoughts:  No  Judgement:  Good  Insight:  Good  Psychomotor Activity:  Normal  Akathisia:  No  Fund of Knowledge:  Good      Assets:  Communication Skills Desire for Improvement Social Support  Cognition:  WNL  ADL's:  Impaired  AIMS (if indicated):        Other History   These have been pulled in through the EMR, reviewed, and updated if appropriate.  Family History:  The patient's family history includes Diabetes in her maternal grandmother, mother, and sister; Hypertension in her sister; Thyroid  disease in her sister.  Medical History: Past Medical  History:  Diagnosis Date   Anxiety    Arthritis    Bedbound    CAD, multiple vessel    s/p CABG 01/13/23   Depression    DM type 2 with diabetic peripheral neuropathy (HCC)    DM2 (diabetes mellitus, type 2) (HCC)    DVT (deep venous thrombosis) (HCC)    Gastrostomy tube dependent (HCC)    History of iron deficiency anemia    HLD (hyperlipidemia)    HTN (hypertension)    Hypothyroidism    Memory deficit after cerebral infarction    Migraines    Morbid obesity (HCC)    Pulmonary embolism (HCC)    Sacral decubitus ulcer    Sacral osteomyelitis (HCC)    Stroke (HCC)    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)     Surgical History: Past Surgical History:  Procedure Laterality Date   CESAREAN SECTION     CORONARY ARTERY BYPASS GRAFT  01/13/2023   IR REMOVAL TUN CV CATH W/O FL  03/20/2023   TUBAL LIGATION       Medications:   Current Facility-Administered Medications:    acetaminophen  (TYLENOL ) tablet 1,000 mg, 1,000 mg, Oral, Once, Chatterjee, Srobona Tublu, MD   apixaban  (ELIQUIS ) tablet 5 mg, 5 mg, Per Tube, BID, Chatterjee, Srobona Tublu, MD, 5 mg at 12/22/23 9166   ascorbic acid  (VITAMIN C ) tablet 500 mg, 500 mg, Oral, BID, Jens Durand, MD, 500 mg at 12/22/23 1150   atorvastatin  (LIPITOR) tablet 80 mg, 80 mg, Per Tube, Daily, Chatterjee, Srobona Tublu, MD, 80 mg at 12/22/23 9166   bisacodyl  (DULCOLAX) EC tablet 5 mg, 5 mg, Oral, Daily PRN, Dana, Srobona Tublu, MD   bisacodyl  (DULCOLAX) suppository 10 mg, 10 mg, Rectal, Daily PRN, Jens Durand, MD   cefTRIAXone  (ROCEPHIN ) 2 g in sodium chloride  0.9 % 100 mL IVPB, 2 g, Intravenous, Q24H, Dana, Srobona Tublu, MD, Stopped at 12/21/23 1546   Chlorhexidine  Gluconate Cloth 2 % PADS 6 each, 6 each, Topical, Daily, Ayiku, Bernard, MD, 6 each at 12/21/23 1308   sennosides (SENOKOT) 8.8 MG/5ML syrup 5 mL, 5 mL, Per Tube, QHS **AND** docusate (COLACE) 50 MG/5ML liquid 100 mg, 100 mg, Per Tube, QHS, Ayiku, Bernard, MD   famotidine  (PEPCID ) 40 MG/5ML suspension 40 mg, 40 mg, Per Tube, QHS, Ayiku, Bernard, MD, 40 mg at 12/21/23 2318   feeding supplement (ENSURE PLUS HIGH PROTEIN) liquid 237 mL, 237 mL, Oral, TID BM, Jens Durand, MD   gabapentin  (NEURONTIN ) 250 MG/5ML solution 900 mg, 900 mg, Per Tube, TID, Ayiku, Bernard, MD, 900 mg at 12/22/23 0844   Gerhardt's butt cream, , Topical, BID, Chatterjee, Srobona Tublu, MD, Given at 12/22/23 0834   guaiFENesin  (ROBITUSSIN) 100 MG/5ML liquid 10 mL, 10 mL, Per Tube, BID, Chatterjee, Srobona Tublu, MD, 10 mL at 12/22/23 9167   HYDROcodone -acetaminophen  (NORCO/VICODIN) 5-325 MG  per tablet 1 tablet, 1 tablet, Per Tube, Q4H PRN, Chatterjee, Srobona Tublu, MD, 1 tablet at 12/22/23 9167   levothyroxine  (SYNTHROID ) tablet 50 mcg, 50 mcg, Per Tube, Daily, Chatterjee, Srobona Tublu, MD, 50 mcg at 12/22/23 9382   LORazepam  (ATIVAN ) tablet 1 mg, 1 mg, Per Tube, Q12H PRN, Jens Durand, MD   midodrine  (PROAMATINE ) tablet 10 mg, 10 mg, Per Tube, Q breakfast, Chatterjee, Srobona Tublu, MD, 10 mg at 12/22/23 9166   multivitamin with minerals tablet 1 tablet, 1 tablet, Oral, Daily, Ayiku, Bernard, MD, 1 tablet at 12/22/23 1150   nystatin  (MYCOSTATIN /NYSTOP ) topical powder 1  Application, 1 Application, Topical, BID, Chatterjee, Srobona Tublu, MD, 1 Application at 12/22/23 9164   ondansetron  (ZOFRAN -ODT) disintegrating tablet 4 mg, 4 mg, Oral, Q8H PRN, Chatterjee, Srobona Tublu, MD   polyethylene glycol (MIRALAX  / GLYCOLAX ) packet 17 g, 17 g, Per Tube, Daily, Chatterjee, Srobona Tublu, MD, 17 g at 12/22/23 9164   sertraline  (ZOLOFT ) tablet 50 mg, 50 mg, Oral, Daily, Hampton, Tracie B, NP, 50 mg at 12/22/23 9166   traMADol  (ULTRAM ) tablet 50 mg, 50 mg, Per Tube, Q12H PRN, Ayiku, Bernard, MD, 50 mg at 12/21/23 2313   zinc  sulfate (50mg  elemental zinc ) capsule 220 mg, 220 mg, Oral, Daily, Ayiku, Bernard, MD, 220 mg at 12/22/23 1150  Allergies: No Known Allergies  Crews Mccollam, MD

## 2023-12-22 NOTE — Progress Notes (Signed)
 Progress Note    Ruth Mayo  FMW:[id number] DOB: Apr 20, 1961  DOA: 12/20/2023 PCP: Allen Lauraine CROME, PA-C      Brief Narrative:    Medical records reviewed and are as summarized below:  Ruth Mayo is a 63 y.o. female with medical history significant of CAD, s/p of CABG 04/2023 postop course complicated by wound dehiscence and associated sepsis, bedbound and development of sacral ulcer and sacral osteomyelitis, chronic indwelling Foley catheter, hypertension, hyperlipidemia, diabetes mellitus, hypothyroidism, depression with anxiety, obesity, anemia, dysphagia s/p feeding tube placement (but only used for medications).  She was recently discharged from the hospital on 12/11/2023 for complicated UTI due to E. coli.  She presented from [facility] to the ED with general weakness, increased somnolence and lethargy.     Assessment/Plan:   Principal Problem:   Complicated UTI (urinary tract infection) Active Problems:   Hypokalemia   Hypophosphatemia   Episode of recurrent major depressive disorder (HCC)   Hypomagnesemia   There is no height or weight on file to calculate BMI.   Acute complicated UTI in the setting of chronic indwelling Foley catheter: Urine culture showed Pseudomonas aeruginosa. Change IV ceftriaxone  to Levaquin or ciprofloxacin  He has multiple nonobstructive bilateral renal calculi Recent urine culture from 12/01/2023 showed pansensitive E. coli.   Hypotension: Improved but BP still soft.  Continue midodrine . S/p treatment with IV fluids.   History of pulmonary embolism December 2024: Continue Eliquis    Stage IV sacral decubitus ulcer, chronic osteomyelitis of sacral wound, chronic bedbound status: Present on admission.  Analgesics as needed for pain. Continue local wound care.  Follow-up with wound care nurse.   Hypokalemia: Improved.  Continue potassium repletion.   Hypophosphatemia: Improved Hypomagnesemia:  Improved   Severe hypoalbuminemia: Albumin less than 1.5.  Encourage adequate oral intake.  Continue nutritional supplements    Depression, anxiety: Continue sertraline .  She was evaluated by the psychiatrist. Escitalopram  was discontinued by psychiatrist.   Comorbidities include chronic anemia, type II DM, hypothyroidism, anxiety, depression   She requested to speak with a Child psychotherapist.  She said  she does not want to go back to [facility].  [place], Child psychotherapist, has been notified.    Diet Order             Diet regular Fluid consistency: Thin  Diet effective now                                  Consultants: Psychiatrist  Procedures: None    Medications:    acetaminophen   1,000 mg Oral Once   apixaban   5 mg Per Tube BID   vitamin C   500 mg Oral BID   atorvastatin   80 mg Per Tube Daily   Chlorhexidine  Gluconate Cloth  6 each Topical Daily   sennosides  5 mL Per Tube QHS   And   docusate  100 mg Per Tube QHS   famotidine   40 mg Per Tube QHS   feeding supplement  237 mL Oral TID BM   gabapentin   900 mg Per Tube TID   Gerhardt's butt cream   Topical BID   guaiFENesin   10 mL Per Tube BID   levothyroxine   50 mcg Per Tube Daily   midodrine   10 mg Per Tube Q breakfast   multivitamin with minerals  1 tablet Oral Daily   nystatin   1 Application Topical BID  polyethylene glycol  17 g Per Tube Daily   sertraline   50 mg Oral Daily   zinc  sulfate (50mg  elemental zinc )  220 mg Oral Daily   Continuous Infusions:  cefTRIAXone  (ROCEPHIN )  IV Stopped (12/21/23 1546)     Anti-infectives (From admission, onward)    Start     Dose/Rate Route Frequency Ordered Stop   12/21/23 1500  cefTRIAXone  (ROCEPHIN ) 2 g in sodium chloride  0.9 % 100 mL IVPB        2 g 200 mL/hr over 30 Minutes Intravenous Every 24 hours 12/20/23 1751     12/20/23 1445  cefTRIAXone  (ROCEPHIN ) 2 g in sodium chloride  0.9 % 100 mL IVPB        2 g 200 mL/hr  over 30 Minutes Intravenous Once 12/20/23 1437 12/20/23 1550              Family Communication/Anticipated D/C date and plan/Code Status   DVT prophylaxis:  apixaban  (ELIQUIS ) tablet 5 mg     Code Status: Full Code  Family Communication: None Disposition Plan: Plan to discharge to SNF   Status is: Inpatient Remains inpatient appropriate because: Acute UTI       Subjective:   Interval events noted.  She complains of low back pain from her sacral wound.  Otherwise, she feels better.  She requested to speak with a Child psychotherapist.  Objective:    Vitals:   12/21/23 2150 12/22/23 0005 12/22/23 0421 12/22/23 0840  BP: (!) 97/48 (!) 86/42 (!) 88/41 (!) 91/48  Pulse: 86 66 75 81  Resp: 18 18 14 18   Temp: 98.1 F (36.7 C) 97.9 F (36.6 C) 98.5 F (36.9 C) 98.1 F (36.7 C)  TempSrc:  Oral Oral   SpO2: 97% 99% 96% 96%   No data found.   Intake/Output Summary (Last 24 hours) at 12/22/2023 1046 Last data filed at 12/22/2023 0326 Gross per 24 hour  Intake 410.12 ml  Output --  Net 410.12 ml   There were no vitals filed for this visit.  Exam:  GEN: NAD SKIN: Warm and dry EYES: No pallor or icterus ENT: MMM CV: RRR PULM: CTA B ABD: soft, ND, NT, +BS, + G-tube CNS: AAO x 3, non focal EXT: Bilateral leg edema.  No tenderness or erythema       Data Reviewed:   I have personally reviewed following labs and imaging studies:  Labs: Labs show the following:   Basic Metabolic Panel: Recent Labs  Lab 12/20/23 1245 12/21/23 0601 12/21/23 1552 12/22/23 0531  NA 134* 139  --  137  K 4.0 2.4* 3.8 3.4*  CL 103 106  --  108  CO2 17* 23  --  22  GLUCOSE 69* 49*  --  47*  BUN <5* 5*  --  <5*  CREATININE <0.30* <0.30*  --  <0.30*  CALCIUM  9.1 8.6*  --  8.1*  MG  --  1.7  --  1.9  PHOS  --  1.4*  --  2.9   GFR CrCl cannot be calculated (This lab value cannot be used to calculate CrCl because it is not a number: <0.30). Liver Function Tests: Recent  Labs  Lab 12/20/23 1245 12/21/23 0601 12/22/23 0531  AST 33 26  --   ALT 11 11  --   ALKPHOS 151* 124  --   BILITOT 1.5* 0.7  --   PROT 5.4* 4.3*  --   ALBUMIN 1.6* <1.5* <1.5*   No results for input(s): LIPASE,  AMYLASE in the last 168 hours. No results for input(s): AMMONIA in the last 168 hours. Coagulation profile No results for input(s): INR, PROTIME in the last 168 hours.  CBC: Recent Labs  Lab 12/20/23 1245 12/21/23 0601  WBC 8.3 5.0  HGB 11.5* 9.3*  HCT 35.5* 29.4*  MCV 86.0 90.2  PLT 416* 294   Cardiac Enzymes: No results for input(s): CKTOTAL, CKMB, CKMBINDEX, TROPONINI in the last 168 hours. BNP (last 3 results) No results for input(s): PROBNP in the last 8760 hours. CBG: No results for input(s): GLUCAP in the last 168 hours. D-Dimer: No results for input(s): DDIMER in the last 72 hours. Hgb A1c: No results for input(s): HGBA1C in the last 72 hours. Lipid Profile: No results for input(s): CHOL, HDL, LDLCALC, TRIG, CHOLHDL, LDLDIRECT in the last 72 hours. Thyroid  function studies: No results for input(s): TSH, T4TOTAL, T3FREE, THYROIDAB in the last 72 hours.  Invalid input(s): FREET3 Anemia work up: Recent Labs    12/21/23 0600  FERRITIN 269  TIBC NOT CALCULATED  IRON 38   Sepsis Labs: Recent Labs  Lab 12/20/23 1245 12/20/23 1354 12/20/23 1355 12/21/23 0601  WBC 8.3  --   --  5.0  LATICACIDVEN  --  1.2 1.3  --     Microbiology Recent Results (from the past 240 hours)  Urine Culture     Status: Abnormal (Preliminary result)   Collection Time: 12/20/23  1:53 PM   Specimen: Urine, Random  Result Value Ref Range Status   Specimen Description   Final    URINE, RANDOM Performed at [facility], [address], [place], [place] [address]    Special Requests   Final    NONE Reflexed from [id number] Performed at [facility], [address] [place], [place] [address]     Culture (A)  Final    90,000 COLONIES/mL VONNE NEGATIVE RODS IDENTIFICATION AND SUSCEPTIBILITIES TO FOLLOW Performed at [facility], [address], [place], [place] [address]    Report Status PENDING  Incomplete  Blood culture (routine single)     Status: None (Preliminary result)   Collection Time: 12/20/23  1:54 PM   Specimen: BLOOD  Result Value Ref Range Status   Specimen Description   Final    BLOOD RIGHT ANTECUBITAL Performed at [facility], [address] [place], [place] [address]    Special Requests   Final    BOTTLES DRAWN AEROBIC AND ANAEROBIC Blood Culture adequate volume Performed at [facility], [address] [place], [place] [address]    Culture  Setup Time   Final    GRAM POSITIVE COCCI AEROBIC BOTTLE ONLY CRITICAL RESULT CALLED TO, READ BACK BY AND VERIFIED WITH: EMILY STEINBOCK 12/21/23 1543 SLM Performed at [facility], [address] [place], [place] [address]    Culture GRAM POSITIVE COCCI  Final   Report Status PENDING  Incomplete  Blood Culture ID Panel (Reflexed)     Status: Abnormal   Collection Time: 12/20/23  1:54 PM  Result Value Ref Range Status   Enterococcus faecalis NOT DETECTED NOT DETECTED Final   Enterococcus Faecium NOT DETECTED NOT DETECTED Final   Listeria monocytogenes NOT DETECTED NOT DETECTED Final   Staphylococcus species DETECTED (A) NOT DETECTED Final    Comment: CRITICAL RESULT CALLED TO, READ BACK BY AND VERIFIED WITH: EMILY STEINBOCK 12/21/23 1543 SLM    Staphylococcus aureus (BCID) NOT DETECTED NOT DETECTED Final   Staphylococcus epidermidis DETECTED (A)  NOT DETECTED Final    Comment: Methicillin (oxacillin) resistant coagulase negative staphylococcus. Possible blood culture contaminant (unless isolated from more than one blood culture draw or clinical case suggests pathogenicity). No antibiotic treatment is indicated for blood  culture contaminants. CRITICAL RESULT CALLED TO, READ BACK BY  AND VERIFIED WITH: EMILY STEINBOCK 12/21/23 1543 SLM    Staphylococcus lugdunensis NOT DETECTED NOT DETECTED Final   Streptococcus species NOT DETECTED NOT DETECTED Final   Streptococcus agalactiae NOT DETECTED NOT DETECTED Final   Streptococcus pneumoniae NOT DETECTED NOT DETECTED Final   Streptococcus pyogenes NOT DETECTED NOT DETECTED Final   A.calcoaceticus-baumannii NOT DETECTED NOT DETECTED Final   Bacteroides fragilis NOT DETECTED NOT DETECTED Final   Enterobacterales NOT DETECTED NOT DETECTED Final   Enterobacter cloacae complex NOT DETECTED NOT DETECTED Final   Escherichia coli NOT DETECTED NOT DETECTED Final   Klebsiella aerogenes NOT DETECTED NOT DETECTED Final   Klebsiella oxytoca NOT DETECTED NOT DETECTED Final   Klebsiella pneumoniae NOT DETECTED NOT DETECTED Final   Proteus species NOT DETECTED NOT DETECTED Final   Salmonella species NOT DETECTED NOT DETECTED Final   Serratia marcescens NOT DETECTED NOT DETECTED Final   Haemophilus influenzae NOT DETECTED NOT DETECTED Final   Neisseria meningitidis NOT DETECTED NOT DETECTED Final   Pseudomonas aeruginosa NOT DETECTED NOT DETECTED Final   Stenotrophomonas maltophilia NOT DETECTED NOT DETECTED Final   Candida albicans NOT DETECTED NOT DETECTED Final   Candida auris NOT DETECTED NOT DETECTED Final   Candida glabrata NOT DETECTED NOT DETECTED Final   Candida krusei NOT DETECTED NOT DETECTED Final   Candida parapsilosis NOT DETECTED NOT DETECTED Final   Candida tropicalis NOT DETECTED NOT DETECTED Final   Cryptococcus neoformans/gattii NOT DETECTED NOT DETECTED Final   Methicillin resistance mecA/C DETECTED (A) NOT DETECTED Final    Comment: CRITICAL RESULT CALLED TO, READ BACK BY AND VERIFIED WITH: EMILY STEINBOCK 12/21/23 1543 SLM Performed at [facility], [address] [place], [place] [address]   Culture, blood (single)     Status: None (Preliminary result)   Collection Time: 12/21/23  6:01 AM   Specimen:  BLOOD  Result Value Ref Range Status   Specimen Description BLOOD BLOOD RIGHT HAND  Final   Special Requests   Final    BOTTLES DRAWN AEROBIC ONLY Blood Culture adequate volume   Culture   Final    NO GROWTH < 24 HOURS Performed at [facility], [address], [place], [place] [address]    Report Status PENDING  Incomplete    Procedures and diagnostic studies:  CT ABDOMEN PELVIS W CONTRAST Addendum Date: 12/20/2023 ADDENDUM REPORT: 12/20/2023 19:16 ADDENDUM: Additional impression. Similar slightly nodular appearing airspace opacity in the left lung base, could be due to chronic aspiration but given lack of resolution and no significant change in appearance, some concern raised for neoplasm. Suggest pulmonary consultation. Electronically Signed   By: Luke Bun M.D.   On: 12/20/2023 19:16   Result Date: 12/20/2023 CLINICAL DATA:  Abdominal pain nausea vomiting diarrhea EXAM: CT ABDOMEN AND PELVIS WITH CONTRAST TECHNIQUE: Multidetector CT imaging of the abdomen and pelvis was performed using the standard protocol following bolus administration of intravenous contrast. RADIATION DOSE REDUCTION: This exam was performed according to the departmental dose-optimization program which includes automated exposure control, adjustment of the mA and/or kV according to patient size and/or use of iterative reconstruction technique. CONTRAST:  75mL OMNIPAQUE  IOHEXOL  300 MG/ML  SOLN COMPARISON:  CT 12/01/2023, 10/17/2023 FINDINGS: Lower chest:  Lower sternotomy. Bandlike density in the right posterior lung base suggestive of scarring. Slightly nodular appearing airspace disease within the left lung base, no change in appearance or configuration since June CT. Coronary vascular calcification. Hepatobiliary: No calcified gallstones.  No biliary distension. Pancreas: No ductal dilatation. Slightly indistinct appearance of peripancreatic fat planes at the head. Suspicion of small volume fluid between the  duodenum and head of pancreas, series 2, image 32. Spleen: Normal in size without focal abnormality. Adrenals/Urinary Tract: Adrenal glands are normal. Kidneys show no hydronephrosis. Punctate nonobstructing bilateral kidney stones. Catheter in the bladder. Bladder appears diffusely thick walled with mild mucosal enhancement. Stomach/Bowel: Gastrostomy tube in the stomach. No dilated small bowel. No acute bowel wall thickening. Large stool burden with moderate rectal distension by feces. Vascular/Lymphatic: Advanced aortic atherosclerosis. No aneurysm. No suspicious lymph nodes Reproductive: Uterus and bilateral adnexa are unremarkable. Other: Negative for free air or ascites. Small volume gas within the subcutaneous soft tissues of the lower anterior abdominal wall. Musculoskeletal: Deep sacral decubitus ulcer extending to the bone. Erosive changes at the sacrococcygeal region, grossly similar compared with 7/28 exam consistent with osteomyelitis. Residual presacral soft tissue stranding but appears slightly improved compared with 12/01/2023. Slight decreased size of small rim enhancing fluid collection within the right medial gluteal region, this measures 13 x 11 mm previously 17 x 17 mm. IMPRESSION: 1. Slightly indistinct appearance of peripancreatic fat planes at the head of pancreas with suspicion of small volume fluid between the duodenum and head of pancreas, suggest correlation with pancreatic enzymes to exclude pancreatitis. Duodenitis could also be considered. 2. Large stool burden with moderate rectal distension by feces. 3. Deep sacral decubitus ulcer extending to the bone with erosive changes at the sacrococcygeal region consistent with osteomyelitis, grossly stable compared with 12/01/2023 exam. Residual presacral soft tissue stranding but appears slightly improved compared with 12/01/2023. Slight decreased size of small rim enhancing fluid collection within the right medial gluteal region. 4. Punctate  nonobstructing bilateral kidney stones. 5. Aortic atherosclerosis. Aortic Atherosclerosis (ICD10-I70.0). Electronically Signed: By: Luke Bun M.D. On: 12/20/2023 18:50               LOS: 2 days   Sweden Lesure  [facility]   Pager on [email or web address]. If 7PM-7AM, please contact night-coverage at [email or web address]     12/22/2023, 10:46 AM

## 2023-12-22 NOTE — Plan of Care (Signed)

## 2023-12-22 NOTE — Progress Notes (Signed)
 PHARMACY CONSULT NOTE - ELECTROLYTES  Pharmacy Consult for Electrolyte Monitoring and Replacement   Recent Labs:   CrCl cannot be calculated (This lab value cannot be used to calculate CrCl because it is not a number: <0.30). Potassium (mmol/L)  Date Value  12/22/2023 3.4 (L)   Magnesium  (mg/dL)  Date Value  91/81/7974 1.9   Calcium  (mg/dL)  Date Value  91/81/7974 8.1 (L)   Calcium , Total (PTH) (mg/dL)  Date Value  89/70/7975 9.7   Albumin (g/dL)  Date Value  91/81/7974 <1.5 (L)  01/02/2018 3.8   Phosphorus (mg/dL)  Date Value  91/81/7974 2.9   Sodium (mmol/L)  Date Value  12/22/2023 137  01/02/2018 137   Corrected Ca: 10.6 mg/dL  Assessment  Ruth Mayo is a 63 y.o. female presenting with general weakness, increased somnolence and lethargy. PMH significant for CAD, s/p of CABG 04/2023, hypertension, hyperlipidemia, diabetes mellitus, hypothyroidism, depression with anxiety, obesity, anemia, dysphagia s/p feeding tube placement. Pharmacy has been consulted to monitor and replace electrolytes.  Diet: heart healthy MIVF: N/A Pertinent medications: N/A  Goal of Therapy: Electrolytes WNL  Plan:  K 3.4: Give KCL 20 mEq PO x1 Check BMP, Mg, Phos with AM labs  Thank you for allowing pharmacy to be a part of this patient's care.  Damien Napoleon, PharmD Clinical Pharmacist 12/22/2023 7:51 AM

## 2023-12-22 NOTE — Progress Notes (Signed)
 Initial Nutrition Assessment  DOCUMENTATION CODES:   Not applicable  INTERVENTION:   -Obtain new wt -Liberalize diet to regular for widest variety of meal selections -MVI with minerals daily -500 mg vitamin C  BID -200 mg zinc  sulfate daily x 14 days  -RD will draw labs to assess for potential micronutrient deficiencies which may impede wound healing: vitamin A   NUTRITION DIAGNOSIS:   Increased nutrient needs related to wound healing as evidenced by estimated needs.  GOAL:   Patient will meet greater than or equal to 90% of their needs  MONITOR:   PO intake, Supplement acceptance  REASON FOR ASSESSMENT:   Consult Assessment of nutrition requirement/status  ASSESSMENT:   Pt with medical history significant of CAD, s/p of CABG 04/2023 postop course complicated by wound dehiscence and associated sepsis, bedbound and development of sacral ulcer and sacral osteomyelitis, chronic indwelling Foley catheter, hypertension, hyperlipidemia, diabetes mellitus, hypothyroidism, depression with anxiety, obesity, anemia, chronic close anemia, s/p feeding tube placement (only use it for taking medications patient), who presents from [facility] with weakness, increased somnolence, less interactive.  Pt admitted with complicated UTI.    Reviewed I/O's: +410 ml x 24 hours and +3.4 L since admission  Per [facility] notes, pt with stage 4 pressure injury to sacrum.   Pt sitting up in bed at time of visit, with flat affect. Pt confirms that she is from [facility]. She admits to decreased oral intake since 04/2023 (her last hospitalization). Pt shares that she tries to eat, has no difficulty chewing or swallowing, however, experiences early satiety. Pt consumed about 4 bites of pancake at breakfast today; she asked RD to remove the tray fro her tray table and refused offer of alternate tray.   Pt admits to feeding tube (PEG), which she uses for medications only. Last  documented use of PEG for feeding was in 04/2023.   Pt unsure of UBW or if she has lost weight. Reviewed of wt history suggests stable weight, however, last recorded wt was on 12/06/23. RD will obtain new wt to better assess acute weight changes.   Discussed importance of good meal and supplement intake to promote healing. Pt amenable to supplements. Due to decreased oral intake, RD will liberalize diet in attempt to optimize oral intake.   Medications reviewed and include senokot, colace, neurontin , miralax , and potassium chloride .   Lab Results  Component Value Date   HGBA1C 7.6 (H) 03/19/2023   PTA DM medications are none.   Labs reviewed: K: 3.4 (on supplementation).  NUTRITION - FOCUSED PHYSICAL EXAM:  Flowsheet Row Most Recent Value  Orbital Region No depletion  Upper Arm Region Mild depletion  Thoracic and Lumbar Region No depletion  Buccal Region No depletion  Temple Region No depletion  Clavicle Bone Region No depletion  Clavicle and Acromion Bone Region No depletion  Scapular Bone Region No depletion  Dorsal Hand No depletion  Patellar Region Mild depletion  Anterior Thigh Region Mild depletion  Posterior Calf Region Mild depletion  Edema (RD Assessment) None  Hair Reviewed  Eyes Reviewed  Mouth Reviewed  Skin Reviewed  Nails Reviewed    Diet Order:   Diet Order             Diet regular Fluid consistency: Thin  Diet effective now                   EDUCATION NEEDS:   Education needs have been addressed  Skin:  Skin Assessment: Skin Integrity  Issues: Skin Integrity Issues:: Stage IV Stage IV: sacrum  Last BM:  12/21/23 (type 7)  Height:   Ht Readings from Last 1 Encounters:  12/06/23 5' 0.98 (1.549 m)    Weight:   Wt Readings from Last 1 Encounters:  12/06/23 80.3 kg    Ideal Body Weight:  47.7 kg  BMI:  There is no height or weight on file to calculate BMI.  Estimated Nutritional Needs:   Kcal:  1500-1700  Protein:  80-95  grams  Fluid:  1.5-1.7 L    Margery ORN, RD, LDN, CDCES Registered Dietitian III Certified Diabetes Care and Education Specialist If unable to reach this RD, please use RD Inpatient group chat on secure chat between hours of 8am-4 pm daily

## 2023-12-23 DIAGNOSIS — N390 Urinary tract infection, site not specified: Secondary | ICD-10-CM | POA: Diagnosis not present

## 2023-12-23 LAB — BASIC METABOLIC PANEL WITH GFR
Anion gap: 6 (ref 5–15)
BUN: <5 mg/dL — ABNORMAL LOW (ref 6–20)
CO2: 25 mmol/L (ref 22–32)
Calcium: 8.5 mg/dL — ABNORMAL LOW (ref 8.9–10.3)
Chloride: 104 mmol/L (ref 98–111)
Creatinine, Ser: <0.3 mg/dL — ABNORMAL LOW (ref 0.44–1.00)
Glucose, Bld: 73 mg/dL (ref 70–99)
Potassium: 3 mmol/L — ABNORMAL LOW (ref 3.5–5.1)
Sodium: 135 mmol/L (ref 135–145)

## 2023-12-23 LAB — CORTISOL: Cortisol, Plasma: 12.5 ug/dL

## 2023-12-23 LAB — MAGNESIUM: Magnesium: 1.9 mg/dL (ref 1.7–2.4)

## 2023-12-23 MED ORDER — CIPROFLOXACIN HCL 500 MG PO TABS: 500.0000 mg | ORAL_TABLET | Freq: Two times a day (BID) | ORAL | Status: AC

## 2023-12-23 MED ORDER — ACETAMINOPHEN 325 MG PO TABS
650.0000 mg | ORAL_TABLET | ORAL | Status: DC | PRN
  Administered 2023-12-23: 650 mg via ORAL
  Filled 2023-12-23: qty 2

## 2023-12-23 MED ORDER — MIDODRINE HCL 10 MG PO TABS: 10.0000 mg | ORAL_TABLET | Freq: Three times a day (TID) | ORAL | Status: DC

## 2023-12-23 MED ORDER — ZINC SULFATE 220 (50 ZN) MG PO CAPS: 220.0000 mg | ORAL_CAPSULE | Freq: Every day | ORAL | Status: DC

## 2023-12-23 MED ORDER — POTASSIUM CHLORIDE CRYS ER 20 MEQ PO TBCR
40.0000 meq | EXTENDED_RELEASE_TABLET | Freq: Three times a day (TID) | ORAL | Status: DC
  Administered 2023-12-23: 40 meq via ORAL
  Filled 2023-12-23: qty 2

## 2023-12-23 MED ORDER — POTASSIUM CHLORIDE 20 MEQ PO PACK: 20.0000 meq | PACK | Freq: Every day | ORAL | Status: AC

## 2023-12-23 MED ORDER — MIDODRINE HCL 5 MG PO TABS
10.0000 mg | ORAL_TABLET | Freq: Three times a day (TID) | ORAL | Status: DC
  Filled 2023-12-23: qty 2

## 2023-12-23 MED ORDER — SERTRALINE HCL 50 MG PO TABS: 50.0000 mg | ORAL_TABLET | Freq: Every day | ORAL | Status: DC

## 2023-12-23 NOTE — Progress Notes (Signed)
   12/23/23 1600  Spiritual Encounters  Type of Visit Initial  Care provided to: Pt and family  Referral source Family  Reason for visit Routine spiritual support  OnCall Visit No  Interventions  Spiritual Care Interventions Made Established relationship of care and support;Compassionate presence  Intervention Outcomes  Outcomes Connection to spiritual care   Chaplain provided compassionate presence and encouragement to patient and family. The patient wanted to talk later. Chaplain will follow-up.

## 2023-12-23 NOTE — Plan of Care (Signed)

## 2023-12-23 NOTE — Progress Notes (Signed)
 Report given to Harlene, Nurse at [facility]. [facility] to transport via stretcher. IV removed.

## 2023-12-23 NOTE — Progress Notes (Signed)
 PHARMACY CONSULT NOTE - ELECTROLYTES  Pharmacy Consult for Electrolyte Monitoring and Replacement   Recent Labs:   CrCl cannot be calculated (This lab value cannot be used to calculate CrCl because it is not a number: <0.30). Potassium (mmol/L)  Date Value  12/23/2023 3.0 (L)   Magnesium  (mg/dL)  Date Value  91/80/7974 1.9   Calcium  (mg/dL)  Date Value  91/80/7974 8.5 (L)   Calcium , Total (PTH) (mg/dL)  Date Value  89/70/7975 9.7   Albumin (g/dL)  Date Value  91/81/7974 <1.5 (L)  01/02/2018 3.8   Phosphorus (mg/dL)  Date Value  91/81/7974 2.9   Sodium (mmol/L)  Date Value  12/23/2023 135  01/02/2018 137   Corrected Ca: 10.6 mg/dL  Assessment  Ruth Mayo is a 63 y.o. female presenting with general weakness, increased somnolence and lethargy. PMH significant for CAD, s/p of CABG 04/2023, hypertension, hyperlipidemia, diabetes mellitus, hypothyroidism, depression with anxiety, obesity, anemia, dysphagia s/p feeding tube placement. Pharmacy has been consulted to monitor and replace electrolytes.  Diet: heart healthy MIVF: N/A Pertinent medications: N/A  Goal of Therapy: Electrolytes WNL  Plan:  K 3 this AM: provider ordered 40meq KCL po x3  Mg, phos wnl Check BMP with AM labs  Thank you for allowing pharmacy to be a part of this patient's care.  Leonor JAYSON Argyle, PharmD Clinical Pharmacist 12/23/2023 7:34 AM

## 2023-12-23 NOTE — TOC Transition Note (Signed)
 Transition of Care [facility]) - Discharge Note   Patient Details  Name: Ruth Mayo MRN: [id number] Date of Birth: [birth year]  [facility] CM/SW Contact:  Dalia GORMAN Fuse, RN Phone Number: 12/23/2023, 2:00 PM   Clinical Narrative:     Patient is from [facility] LTC. The patient is medically clear to discharge back to the facility. [facility] will transport. The patient would like to transfer to another facility. TOC encouraged the patient and family to work with the SW at [facility] to transition to another facility. The patient's husband would like for the patient to speak with someone on a consistent basis, like a chaplain or therapist. TOC advised him to speak with Director of the facility to verbalize his request.  Final next level of care: Skilled Nursing Facility Barriers to Discharge: Barriers Resolved   Patient Goals and CMS Choice            Discharge Placement              Patient chooses bed at: [facility] Patient to be transferred to facility by: [facility] Name of family member notified: [place] Patient and family notified of of transfer: 12/23/23  Discharge Plan and Services Additional resources added to the After Visit Summary for                                       Social Drivers of Health (SDOH) Interventions SDOH Screenings   Food Insecurity: No Food Insecurity (12/20/2023)  Housing: High Risk (12/20/2023)  Transportation Needs: No Transportation Needs (12/20/2023)  Utilities: Not At Risk (12/20/2023)  Financial Resource Strain: Patient Unable To Answer (03/04/2023)   Received from [facility]  Physical Activity: Insufficiently Active (07/23/2022)   Received from [facility]  Social Connections: Moderately Isolated (12/20/2023)  Stress: No Stress Concern Present (03/02/2023)   Received from [facility]  Recent Concern: Stress - Stress Concern Present (01/14/2023)   Received from [facility]  Tobacco Use: Medium Risk  (12/20/2023)     Readmission Risk Interventions     No data to display

## 2023-12-23 NOTE — Progress Notes (Signed)
 Pt requested pain medicine for chronic sacral pain r/t pressure injury. Due to low blood pressure, pt educated on having to hold narcotics. Offered Tylenol  or Tramadol  to help with pain until Midodrine  had time to get in system. Pt educated on Midodrine  and rechecking vitals before giving Norco. Pt refused Tylenol  and Tramadol  stating it didn't work. Pt educated on Tramadol  and use of keeping non-narcotics in her system to help with pain control. Pt agrees to take Tramadol  until she can get Norco. MD aware.

## 2023-12-23 NOTE — Discharge Summary (Addendum)
 Physician Discharge Summary   Patient: Ruth Mayo MRN: [id number] DOB: 03/31/1961  Admit date:     12/20/2023  Discharge date: 12/23/23  Discharge Physician: AIDA CHO   PCP: Allen Lauraine CROME, PA-C   Recommendations at discharge:   Follow-up with physician at the nursing home within 3 days of discharge Follow-up with Dr. Isadora, pulmonologist, on 01/07/2024 at 10 AM for evaluation of left lung nodule  Discharge Diagnoses: Principal Problem:   Complicated UTI (urinary tract infection) Active Problems:   Hypokalemia   Hypophosphatemia   Episode of recurrent major depressive disorder (HCC)   Hypomagnesemia  Resolved Problems:   * No resolved hospital problems. *  Hospital Course:  Ruth Mayo is a 63 y.o. female with medical history significant of CAD, s/p of CABG 04/2023 postop course complicated by wound dehiscence and associated sepsis, bedbound and development of sacral ulcer and sacral osteomyelitis, chronic indwelling Foley catheter, hypertension, hyperlipidemia, diabetes mellitus, hypothyroidism, depression with anxiety, obesity, anemia, dysphagia s/p feeding tube placement (but only used for medications).  She was recently discharged from the hospital on 12/11/2023 for complicated UTI due to E. coli.  She presented from [facility] nursing home to the ED with general weakness, increased somnolence and lethargy.      Assessment and Plan:   Acute complicated UTI in the setting of chronic indwelling Foley catheter: Urine culture showed Pseudomonas aeruginosa. Continue ciprofloxacin  for 4 more days. S/p treatment with IV ceftriaxone . He has multiple nonobstructive bilateral renal calculi Recent urine culture from 12/01/2023 showed pansensitive E. coli.     Hypotension: Improved but BP still soft.  Continue midodrine .  Increase midodrine  to 10 mg 3 times daily. S/p treatment with IV fluids.     History of pulmonary embolism December 2024: Continue Eliquis       Stage IV sacral decubitus ulcer, chronic osteomyelitis of sacral wound, chronic bedbound status: Present on admission.  Analgesics as needed for pain. Continue local wound care.   Outpatient follow-up with the wound care clinic recommended     Hypokalemia: Improved.  Continue potassium repletion.   Hypophosphatemia: Improved Hypomagnesemia: Improved     Severe hypoalbuminemia: Albumin less than 1.5.  Encourage adequate oral intake.  Continue nutritional supplements     Left lung base nodule: Consulted pulmonologist for further evaluation.  Outpatient follow-up with pulmonologist for further management.    Depression, anxiety: Continue sertraline .  She was evaluated by the psychiatrist. Escitalopram  was discontinued by psychiatrist.     Comorbidities include chronic anemia, type II DM, hypothyroidism, anxiety, depression   Patient says she is able to eat by mouth but she prefers to have her pills through gastrostomy tube.     Her condition has improved and she is deemed stable for discharge to SNF today.      Pain control - [place]  Controlled Substance Reporting System database was reviewed. and patient was instructed, not to drive, operate heavy machinery, perform activities at heights, swimming or participation in water activities or provide baby-sitting services while on Pain, Sleep and Anxiety Medications; until their outpatient Physician has advised to do so again. Also recommended to not to take more than prescribed Pain, Sleep and Anxiety Medications.  Consultants: Pulmonologist, psychiatrist Procedures performed: None  Disposition: Skilled nursing facility Diet recommendation:  Discharge Diet Orders (From admission, onward)     Start     Ordered   12/23/23 0000  Diet regular        12/23/23 1209  Regular diet DISCHARGE MEDICATION: Allergies as of 12/23/2023   No Known Allergies      Medication List     STOP taking these medications     escitalopram  20 MG tablet Commonly known as: LEXAPRO    guaiFENesin  600 MG 12 hr tablet Commonly known as: MUCINEX    LORazepam  1 MG tablet Commonly known as: ATIVAN    nystatin  powder   traMADol  50 MG tablet Commonly known as: ULTRAM        TAKE these medications    apixaban  5 MG Tabs tablet Commonly known as: ELIQUIS  Take 5 mg by mouth 2 (two) times daily.   atorvastatin  80 MG tablet Commonly known as: LIPITOR 80 mg by Enteral route daily.   bisacodyl  5 MG EC tablet Commonly known as: DULCOLAX Take 5 mg by mouth daily as needed for moderate constipation.   ciprofloxacin  500 MG tablet Commonly known as: CIPRO  Place 1 tablet (500 mg total) into feeding tube 2 (two) times daily for 8 doses.   famotidine  40 MG tablet Commonly known as: PEPCID  Take 40 mg by mouth at bedtime.   gabapentin  300 MG capsule Commonly known as: NEURONTIN  Take 3 capsules (900 mg total) by mouth 3 (three) times daily. What changed: how to take this   HYDROcodone -acetaminophen  5-325 MG tablet Commonly known as: NORCO/VICODIN Place 1 tablet into feeding tube every 4 (four) hours as needed for moderate pain (pain score 4-6).   levothyroxine  50 MCG tablet Commonly known as: SYNTHROID  Take 1 tablet (50 mcg total) by mouth daily.   midodrine  10 MG tablet Commonly known as: PROAMATINE  Take 1 tablet (10 mg total) by mouth 3 (three) times daily. What changed: when to take this   ondansetron  4 MG disintegrating tablet Commonly known as: ZOFRAN -ODT Take 4 mg by mouth every 8 (eight) hours as needed for nausea or vomiting.   polyethylene glycol 17 g packet Commonly known as: MIRALAX  / GLYCOLAX  Take 17 g by mouth daily.   potassium chloride  20 MEQ packet Commonly known as: KLOR-CON  Place 20 mEq into feeding tube daily for 3 days. Start taking on: December 24, 2023   senna-docusate 8.6-50 MG tablet Commonly known as: Senokot-S Take 1 tablet by mouth at bedtime.   sertraline  50 MG  tablet Commonly known as: ZOLOFT  Take 1 tablet (50 mg total) by mouth daily. Start taking on: December 24, 2023 What changed:  medication strength how much to take   zinc  sulfate (50mg  elemental zinc ) 220 (50 Zn) MG capsule Take 1 capsule (220 mg total) by mouth daily. Start taking on: December 24, 2023               Discharge Care Instructions  (From admission, onward)           Start     Ordered   12/23/23 0000  Discharge wound care:       Comments: Cleanse sacral wound with Vashe wound cleanser Soila [phone]) do not rinse and allow to air dry.  Using a Q tip applicator apply Vashe moistened Kerlix to wound bed daily, cover with dry guaze and ABD pad and tape.   12/23/23 1208            Follow-up Information     Weber, Lauraine CROME, PA-C Follow up.   Specialty: Physician Assistant Why: hospital follow up Contact information: [address] [place] [address] [phone]                Discharge Exam: There were  no vitals filed for this visit. GEN: NAD SKIN: Warm and dry.  Stage IV sacral decubitus ulcer EYES: No pallor or icterus ENT: MMM CV: RRR PULM: CTA B ABD: soft, ND, NT, +BS CNS: AAO x 3, non focal EXT: No edema or tenderness   Condition at discharge: stable  The results of significant diagnostics from this hospitalization (including imaging, microbiology, ancillary and laboratory) are listed below for reference.   Imaging Studies: CT ABDOMEN PELVIS W CONTRAST Addendum Date: 12/20/2023 ADDENDUM REPORT: 12/20/2023 19:16 ADDENDUM: Additional impression. Similar slightly nodular appearing airspace opacity in the left lung base, could be due to chronic aspiration but given lack of resolution and no significant change in appearance, some concern raised for neoplasm. Suggest pulmonary consultation. Electronically Signed   By: Luke Bun M.D.   On: 12/20/2023 19:16   Result Date: 12/20/2023 CLINICAL DATA:  Abdominal pain nausea  vomiting diarrhea EXAM: CT ABDOMEN AND PELVIS WITH CONTRAST TECHNIQUE: Multidetector CT imaging of the abdomen and pelvis was performed using the standard protocol following bolus administration of intravenous contrast. RADIATION DOSE REDUCTION: This exam was performed according to the departmental dose-optimization program which includes automated exposure control, adjustment of the mA and/or kV according to patient size and/or use of iterative reconstruction technique. CONTRAST:  75mL OMNIPAQUE  IOHEXOL  300 MG/ML  SOLN COMPARISON:  CT 12/01/2023, 10/17/2023 FINDINGS: Lower chest: Lower sternotomy. Bandlike density in the right posterior lung base suggestive of scarring. Slightly nodular appearing airspace disease within the left lung base, no change in appearance or configuration since June CT. Coronary vascular calcification. Hepatobiliary: No calcified gallstones.  No biliary distension. Pancreas: No ductal dilatation. Slightly indistinct appearance of peripancreatic fat planes at the head. Suspicion of small volume fluid between the duodenum and head of pancreas, series 2, image 32. Spleen: Normal in size without focal abnormality. Adrenals/Urinary Tract: Adrenal glands are normal. Kidneys show no hydronephrosis. Punctate nonobstructing bilateral kidney stones. Catheter in the bladder. Bladder appears diffusely thick walled with mild mucosal enhancement. Stomach/Bowel: Gastrostomy tube in the stomach. No dilated small bowel. No acute bowel wall thickening. Large stool burden with moderate rectal distension by feces. Vascular/Lymphatic: Advanced aortic atherosclerosis. No aneurysm. No suspicious lymph nodes Reproductive: Uterus and bilateral adnexa are unremarkable. Other: Negative for free air or ascites. Small volume gas within the subcutaneous soft tissues of the lower anterior abdominal wall. Musculoskeletal: Deep sacral decubitus ulcer extending to the bone. Erosive changes at the sacrococcygeal region,  grossly similar compared with 7/28 exam consistent with osteomyelitis. Residual presacral soft tissue stranding but appears slightly improved compared with 12/01/2023. Slight decreased size of small rim enhancing fluid collection within the right medial gluteal region, this measures 13 x 11 mm previously 17 x 17 mm. IMPRESSION: 1. Slightly indistinct appearance of peripancreatic fat planes at the head of pancreas with suspicion of small volume fluid between the duodenum and head of pancreas, suggest correlation with pancreatic enzymes to exclude pancreatitis. Duodenitis could also be considered. 2. Large stool burden with moderate rectal distension by feces. 3. Deep sacral decubitus ulcer extending to the bone with erosive changes at the sacrococcygeal region consistent with osteomyelitis, grossly stable compared with 12/01/2023 exam. Residual presacral soft tissue stranding but appears slightly improved compared with 12/01/2023. Slight decreased size of small rim enhancing fluid collection within the right medial gluteal region. 4. Punctate nonobstructing bilateral kidney stones. 5. Aortic atherosclerosis. Aortic Atherosclerosis (ICD10-I70.0). Electronically Signed: By: Luke Bun M.D. On: 12/20/2023 18:50   CT ABDOMEN PELVIS W CONTRAST Result Date:  12/01/2023 CLINICAL DATA:  Abdominal pain, acute, nonlocalized weakness, abd pain, hx sacral osteomyelitis EXAM: CT ABDOMEN AND PELVIS WITH CONTRAST TECHNIQUE: Multidetector CT imaging of the abdomen and pelvis was performed using the standard protocol following bolus administration of intravenous contrast. RADIATION DOSE REDUCTION: This exam was performed according to the departmental dose-optimization program which includes automated exposure control, adjustment of the mA and/or kV according to patient size and/or use of iterative reconstruction technique. CONTRAST:  OMNIPAQUE  IOHEXOL  300 MG/ML  SOLN COMPARISON:  October 17, 2023 FINDINGS: Lower chest:  Dependent airspace opacities in both lung bases, more nodular on the left, worrisome for aspiration. No pleural effusion. Multi-vessel coronary atherosclerosis. Sternotomy wires. Hepatobiliary: No mass.No radiopaque stones or wall thickening of the gallbladder. No intrahepatic or extrahepatic biliary ductal dilation. The portal veins are patent. Pancreas: Diffuse fatty atrophy of the pancreatic parenchyma. No mass or ductal dilation. no peripancreatic inflammation or fluid collection. Spleen: Normal size. No mass. Adrenals/Urinary Tract: No adrenal masses. No renal mass. Multiple small nonobstructive bilateral calyceal calculi. No hydronephrosis. The urinary bladder is completely decompressed with a urinary catheter in place. Gas in the bladder lumen is likely related to catheterization. Circumferential wall thickening of the urinary bladder. Stomach/Bowel: The stomach contains ingested material without focal abnormality. Percutaneous gastrostomy tube well-positioned in the gastric antrum. No small bowel wall thickening or inflammation. No small bowel obstruction.Normal appendix. Vascular/Lymphatic: No aortic aneurysm. Diffuse aortoiliac atherosclerosis. No intraabdominal or pelvic lymphadenopathy. Reproductive: Age-related atrophy of the uterus and ovaries. No concerning adnexal mass.No free pelvic fluid. Other: No pneumoperitoneum, ascites, or mesenteric inflammation. Musculoskeletal: No acute fracture. Absence of the lower sacrum has progressed in the interim. Redemonstrated large sacral decubitus ulcer, which is 5 cm deep, contacting the sacrococcygeal junction. Increased craniocaudal extent measuring 11.5 cm with undermining of the subcutaneous fat superiorly. Along the right lateral aspect within the medial right gluteal musculature, there is a small peripherally enhancing gas and fluid collection measuring 1.7 x 1.6 x 1.8 cm. IMPRESSION: 1. Circumferential wall thickening of the bladder may be due to  underdistention or chronic inflammation from the indwelling urinary catheter. If there is concern for acute cystitis, laboratory correlation would be recommended. 2. Increasing size and depth of the sacral decubitus ulcer, measuring 5 cm deep and extending 11.5 cm in the craniocaudal extent. Progressive bony destruction of the lower sacrum, consistent with osteomyelitis. Along the right lateral aspect in the medial right gluteal musculature, there is a small peripherally enhancing gas and fluid collection measuring 1.7 x 1.6 x 1.8 cm. This could reflect an intramuscular abscess or gas and fluid trapped within a lateral extension of the decubitus ulcer. 3. Multiple small nonobstructive bilateral renal calculi. No hydronephrosis. 4. Dependent airspace opacities in both lungs, likely atelectasis on the right. On the left, there is more pronounced nodularity, which is worrisome for aspiration. Aortic Atherosclerosis (ICD10-I70.0). Electronically Signed   By: Rogelia Myers M.D.   On: 12/01/2023 19:57    Microbiology: Results for orders placed or performed during the hospital encounter of 12/20/23  Urine Culture     Status: Abnormal   Collection Time: 12/20/23  1:53 PM   Specimen: Urine, Random  Result Value Ref Range Status   Specimen Description   Final    URINE, RANDOM Performed at [facility], [address] [place], [place] [address]    Special Requests   Final    NONE Reflexed from [id number] Performed at [facility], [address] [place], [place] [address]  Culture 90,000 COLONIES/mL PSEUDOMONAS AERUGINOSA (A)  Final   Report Status 12/22/2023 FINAL  Final   Organism ID, Bacteria PSEUDOMONAS AERUGINOSA (A)  Final      Susceptibility   Pseudomonas aeruginosa - MIC*    MEROPENEM  >=16 RESISTANT Resistant     CIPROFLOXACIN  <=0.06 SENSITIVE Sensitive     IMIPENEM >=16 RESISTANT Resistant     CEFTAZIDIME/AVIBACTAM 4 SENSITIVE Sensitive ug/mL    CEFTOLOZANE/TAZOBACTAM 1  SENSITIVE Sensitive ug/mL    TOBRAMYCIN <=1 SENSITIVE Sensitive     * 90,000 COLONIES/mL PSEUDOMONAS AERUGINOSA  Blood culture (routine single)     Status: Abnormal   Collection Time: 12/20/23  1:54 PM   Specimen: BLOOD  Result Value Ref Range Status   Specimen Description   Final    BLOOD RIGHT ANTECUBITAL Performed at [facility], [address], [place], [place] [address]    Special Requests   Final    BOTTLES DRAWN AEROBIC AND ANAEROBIC Blood Culture adequate volume Performed at [facility], [address] [place], [place] [address]    Culture  Setup Time   Final    GRAM POSITIVE COCCI AEROBIC BOTTLE ONLY CRITICAL RESULT CALLED TO, READ BACK BY AND VERIFIED WITH: EMILY STEINBOCK 12/21/23 1543 SLM    Culture (A)  Final    STAPHYLOCOCCUS EPIDERMIDIS THE SIGNIFICANCE OF ISOLATING THIS ORGANISM FROM A SINGLE SET OF BLOOD CULTURES WHEN MULTIPLE SETS ARE DRAWN IS UNCERTAIN. PLEASE NOTIFY THE MICROBIOLOGY DEPARTMENT WITHIN ONE WEEK IF SPECIATION AND SENSITIVITIES ARE REQUIRED. Performed at [facility], [address], [place], [place] [address]    Report Status 12/22/2023 FINAL  Final  Blood Culture ID Panel (Reflexed)     Status: Abnormal   Collection Time: 12/20/23  1:54 PM  Result Value Ref Range Status   Enterococcus faecalis NOT DETECTED NOT DETECTED Final   Enterococcus Faecium NOT DETECTED NOT DETECTED Final   Listeria monocytogenes NOT DETECTED NOT DETECTED Final   Staphylococcus species DETECTED (A) NOT DETECTED Final    Comment: CRITICAL RESULT CALLED TO, READ BACK BY AND VERIFIED WITH: EMILY STEINBOCK 12/21/23 1543 SLM    Staphylococcus aureus (BCID) NOT DETECTED NOT DETECTED Final   Staphylococcus epidermidis DETECTED (A) NOT DETECTED Final    Comment: Methicillin (oxacillin) resistant coagulase negative staphylococcus. Possible blood culture contaminant (unless isolated from more than one blood culture draw or clinical case suggests  pathogenicity). No antibiotic treatment is indicated for blood  culture contaminants. CRITICAL RESULT CALLED TO, READ BACK BY AND VERIFIED WITH: EMILY STEINBOCK 12/21/23 1543 SLM    Staphylococcus lugdunensis NOT DETECTED NOT DETECTED Final   Streptococcus species NOT DETECTED NOT DETECTED Final   Streptococcus agalactiae NOT DETECTED NOT DETECTED Final   Streptococcus pneumoniae NOT DETECTED NOT DETECTED Final   Streptococcus pyogenes NOT DETECTED NOT DETECTED Final   A.calcoaceticus-baumannii NOT DETECTED NOT DETECTED Final   Bacteroides fragilis NOT DETECTED NOT DETECTED Final   Enterobacterales NOT DETECTED NOT DETECTED Final   Enterobacter cloacae complex NOT DETECTED NOT DETECTED Final   Escherichia coli NOT DETECTED NOT DETECTED Final   Klebsiella aerogenes NOT DETECTED NOT DETECTED Final   Klebsiella oxytoca NOT DETECTED NOT DETECTED Final   Klebsiella pneumoniae NOT DETECTED NOT DETECTED Final   Proteus species NOT DETECTED NOT DETECTED Final   Salmonella species NOT DETECTED NOT DETECTED Final   Serratia marcescens NOT DETECTED NOT DETECTED Final   Haemophilus influenzae NOT DETECTED NOT DETECTED Final   Neisseria meningitidis NOT DETECTED NOT DETECTED Final  Pseudomonas aeruginosa NOT DETECTED NOT DETECTED Final   Stenotrophomonas maltophilia NOT DETECTED NOT DETECTED Final   Candida albicans NOT DETECTED NOT DETECTED Final   Candida auris NOT DETECTED NOT DETECTED Final   Candida glabrata NOT DETECTED NOT DETECTED Final   Candida krusei NOT DETECTED NOT DETECTED Final   Candida parapsilosis NOT DETECTED NOT DETECTED Final   Candida tropicalis NOT DETECTED NOT DETECTED Final   Cryptococcus neoformans/gattii NOT DETECTED NOT DETECTED Final   Methicillin resistance mecA/C DETECTED (A) NOT DETECTED Final    Comment: CRITICAL RESULT CALLED TO, READ BACK BY AND VERIFIED WITH: EMILY STEINBOCK 12/21/23 1543 SLM Performed at [facility], [address]  [place], [place] [address]   Culture, blood (single)     Status: None (Preliminary result)   Collection Time: 12/21/23  6:01 AM   Specimen: BLOOD  Result Value Ref Range Status   Specimen Description BLOOD BLOOD RIGHT HAND  Final   Special Requests   Final    BOTTLES DRAWN AEROBIC ONLY Blood Culture adequate volume   Culture   Final    NO GROWTH 2 DAYS Performed at [facility], [address] [place], [place] [address]    Report Status PENDING  Incomplete    Labs: CBC: Recent Labs  Lab 12/20/23 1245 12/21/23 0601  WBC 8.3 5.0  HGB 11.5* 9.3*  HCT 35.5* 29.4*  MCV 86.0 90.2  PLT 416* 294   Basic Metabolic Panel: Recent Labs  Lab 12/20/23 1245 12/21/23 0601 12/21/23 1552 12/22/23 0531 12/23/23 0418  NA 134* 139  --  137 135  K 4.0 2.4* 3.8 3.4* 3.0*  CL 103 106  --  108 104  CO2 17* 23  --  22 25  GLUCOSE 69* 49*  --  47* 73  BUN <5* 5*  --  <5* <5*  CREATININE <0.30* <0.30*  --  <0.30* <0.30*  CALCIUM  9.1 8.6*  --  8.1* 8.5*  MG  --  1.7  --  1.9 1.9  PHOS  --  1.4*  --  2.9  --    Liver Function Tests: Recent Labs  Lab 12/20/23 1245 12/21/23 0601 12/22/23 0531  AST 33 26  --   ALT 11 11  --   ALKPHOS 151* 124  --   BILITOT 1.5* 0.7  --   PROT 5.4* 4.3*  --   ALBUMIN 1.6* <1.5* <1.5*   CBG: No results for input(s): GLUCAP in the last 168 hours.  Discharge time spent: greater than 30 minutes.  Signed: AIDA CHO, MD [facility] 12/23/2023

## 2023-12-26 LAB — CULTURE, BLOOD (SINGLE)
Culture: NO GROWTH
Special Requests: ADEQUATE

## 2023-12-26 LAB — VITAMIN A: Vitamin A (Retinoic Acid): 5.1 ug/dL — ABNORMAL LOW (ref 22.0–69.5)

## 2023-12-27 ENCOUNTER — Inpatient Hospital Stay

## 2023-12-27 ENCOUNTER — Emergency Department

## 2023-12-27 ENCOUNTER — Inpatient Hospital Stay
Admission: EM | Admit: 2023-12-27 | Discharge: 2024-01-05 | DRG: 871 | Disposition: E | Attending: Student in an Organized Health Care Education/Training Program | Admitting: Student in an Organized Health Care Education/Training Program

## 2023-12-27 ENCOUNTER — Other Ambulatory Visit: Payer: Self-pay

## 2023-12-27 DIAGNOSIS — D649 Anemia, unspecified: Secondary | ICD-10-CM | POA: Diagnosis present

## 2023-12-27 DIAGNOSIS — J1282 Pneumonia due to coronavirus disease 2019: Secondary | ICD-10-CM | POA: Diagnosis present

## 2023-12-27 DIAGNOSIS — Z6834 Body mass index (BMI) 34.0-34.9, adult: Secondary | ICD-10-CM

## 2023-12-27 DIAGNOSIS — J690 Pneumonitis due to inhalation of food and vomit: Secondary | ICD-10-CM | POA: Diagnosis present

## 2023-12-27 DIAGNOSIS — M4628 Osteomyelitis of vertebra, sacral and sacrococcygeal region: Secondary | ICD-10-CM | POA: Diagnosis present

## 2023-12-27 DIAGNOSIS — L89159 Pressure ulcer of sacral region, unspecified stage: Secondary | ICD-10-CM | POA: Diagnosis not present

## 2023-12-27 DIAGNOSIS — Z931 Gastrostomy status: Secondary | ICD-10-CM

## 2023-12-27 DIAGNOSIS — Z79899 Other long term (current) drug therapy: Secondary | ICD-10-CM

## 2023-12-27 DIAGNOSIS — R7401 Elevation of levels of liver transaminase levels: Secondary | ICD-10-CM | POA: Diagnosis not present

## 2023-12-27 DIAGNOSIS — I2699 Other pulmonary embolism without acute cor pulmonale: Secondary | ICD-10-CM | POA: Diagnosis present

## 2023-12-27 DIAGNOSIS — Z66 Do not resuscitate: Secondary | ICD-10-CM | POA: Diagnosis present

## 2023-12-27 DIAGNOSIS — E876 Hypokalemia: Secondary | ICD-10-CM | POA: Diagnosis present

## 2023-12-27 DIAGNOSIS — N390 Urinary tract infection, site not specified: Secondary | ICD-10-CM | POA: Diagnosis present

## 2023-12-27 DIAGNOSIS — Z79890 Hormone replacement therapy (postmenopausal): Secondary | ICD-10-CM

## 2023-12-27 DIAGNOSIS — Z7401 Bed confinement status: Secondary | ICD-10-CM

## 2023-12-27 DIAGNOSIS — A419 Sepsis, unspecified organism: Principal | ICD-10-CM | POA: Diagnosis present

## 2023-12-27 DIAGNOSIS — E1142 Type 2 diabetes mellitus with diabetic polyneuropathy: Secondary | ICD-10-CM | POA: Diagnosis present

## 2023-12-27 DIAGNOSIS — I472 Ventricular tachycardia, unspecified: Secondary | ICD-10-CM | POA: Diagnosis present

## 2023-12-27 DIAGNOSIS — I69311 Memory deficit following cerebral infarction: Secondary | ICD-10-CM | POA: Diagnosis not present

## 2023-12-27 DIAGNOSIS — J9621 Acute and chronic respiratory failure with hypoxia: Secondary | ICD-10-CM | POA: Diagnosis present

## 2023-12-27 DIAGNOSIS — N179 Acute kidney failure, unspecified: Secondary | ICD-10-CM | POA: Diagnosis present

## 2023-12-27 DIAGNOSIS — R6521 Severe sepsis with septic shock: Secondary | ICD-10-CM | POA: Diagnosis present

## 2023-12-27 DIAGNOSIS — Z951 Presence of aortocoronary bypass graft: Secondary | ICD-10-CM

## 2023-12-27 DIAGNOSIS — I2609 Other pulmonary embolism with acute cor pulmonale: Secondary | ICD-10-CM | POA: Diagnosis not present

## 2023-12-27 DIAGNOSIS — Z7901 Long term (current) use of anticoagulants: Secondary | ICD-10-CM

## 2023-12-27 DIAGNOSIS — Z1624 Resistance to multiple antibiotics: Secondary | ICD-10-CM | POA: Diagnosis present

## 2023-12-27 DIAGNOSIS — Z87891 Personal history of nicotine dependence: Secondary | ICD-10-CM

## 2023-12-27 DIAGNOSIS — R578 Other shock: Secondary | ICD-10-CM | POA: Diagnosis present

## 2023-12-27 DIAGNOSIS — R680 Hypothermia, not associated with low environmental temperature: Secondary | ICD-10-CM | POA: Diagnosis present

## 2023-12-27 DIAGNOSIS — I469 Cardiac arrest, cause unspecified: Secondary | ICD-10-CM | POA: Diagnosis not present

## 2023-12-27 DIAGNOSIS — F32A Depression, unspecified: Secondary | ICD-10-CM | POA: Diagnosis present

## 2023-12-27 DIAGNOSIS — I4891 Unspecified atrial fibrillation: Secondary | ICD-10-CM | POA: Diagnosis present

## 2023-12-27 DIAGNOSIS — Z515 Encounter for palliative care: Secondary | ICD-10-CM | POA: Diagnosis not present

## 2023-12-27 DIAGNOSIS — E785 Hyperlipidemia, unspecified: Secondary | ICD-10-CM | POA: Diagnosis present

## 2023-12-27 DIAGNOSIS — Z8249 Family history of ischemic heart disease and other diseases of the circulatory system: Secondary | ICD-10-CM

## 2023-12-27 DIAGNOSIS — R579 Shock, unspecified: Principal | ICD-10-CM

## 2023-12-27 DIAGNOSIS — K219 Gastro-esophageal reflux disease without esophagitis: Secondary | ICD-10-CM | POA: Diagnosis present

## 2023-12-27 DIAGNOSIS — I10 Essential (primary) hypertension: Secondary | ICD-10-CM | POA: Diagnosis present

## 2023-12-27 DIAGNOSIS — Z833 Family history of diabetes mellitus: Secondary | ICD-10-CM

## 2023-12-27 DIAGNOSIS — E872 Acidosis, unspecified: Secondary | ICD-10-CM | POA: Diagnosis present

## 2023-12-27 DIAGNOSIS — E039 Hypothyroidism, unspecified: Secondary | ICD-10-CM | POA: Diagnosis present

## 2023-12-27 DIAGNOSIS — E113519 Type 2 diabetes mellitus with proliferative diabetic retinopathy with macular edema, unspecified eye: Secondary | ICD-10-CM | POA: Diagnosis present

## 2023-12-27 DIAGNOSIS — I3139 Other pericardial effusion (noninflammatory): Secondary | ICD-10-CM | POA: Diagnosis not present

## 2023-12-27 DIAGNOSIS — E119 Type 2 diabetes mellitus without complications: Secondary | ICD-10-CM | POA: Diagnosis not present

## 2023-12-27 DIAGNOSIS — Z86711 Personal history of pulmonary embolism: Secondary | ICD-10-CM

## 2023-12-27 DIAGNOSIS — I2510 Atherosclerotic heart disease of native coronary artery without angina pectoris: Secondary | ICD-10-CM | POA: Diagnosis present

## 2023-12-27 DIAGNOSIS — E1169 Type 2 diabetes mellitus with other specified complication: Secondary | ICD-10-CM | POA: Diagnosis present

## 2023-12-27 DIAGNOSIS — Z86718 Personal history of other venous thrombosis and embolism: Secondary | ICD-10-CM

## 2023-12-27 DIAGNOSIS — Z79891 Long term (current) use of opiate analgesic: Secondary | ICD-10-CM

## 2023-12-27 DIAGNOSIS — G928 Other toxic encephalopathy: Secondary | ICD-10-CM | POA: Diagnosis present

## 2023-12-27 DIAGNOSIS — G9341 Metabolic encephalopathy: Secondary | ICD-10-CM

## 2023-12-27 DIAGNOSIS — Z8349 Family history of other endocrine, nutritional and metabolic diseases: Secondary | ICD-10-CM

## 2023-12-27 DIAGNOSIS — U071 COVID-19: Secondary | ICD-10-CM | POA: Diagnosis present

## 2023-12-27 DIAGNOSIS — Z8674 Personal history of sudden cardiac arrest: Secondary | ICD-10-CM

## 2023-12-27 DIAGNOSIS — Z794 Long term (current) use of insulin: Secondary | ICD-10-CM

## 2023-12-27 LAB — CBC WITH DIFFERENTIAL/PLATELET
Abs Immature Granulocytes: 0.75 K/uL — ABNORMAL HIGH (ref 0.00–0.07)
Basophils Absolute: 0 K/uL (ref 0.0–0.1)
Basophils Relative: 0 %
Eosinophils Absolute: 0 K/uL (ref 0.0–0.5)
Eosinophils Relative: 0 %
HCT: 27.5 % — ABNORMAL LOW (ref 36.0–46.0)
Hemoglobin: 8.5 g/dL — ABNORMAL LOW (ref 12.0–15.0)
Immature Granulocytes: 3 %
Lymphocytes Relative: 7 %
Lymphs Abs: 1.4 K/uL (ref 0.7–4.0)
MCH: 28.7 pg (ref 26.0–34.0)
MCHC: 30.9 g/dL (ref 30.0–36.0)
MCV: 92.9 fL (ref 80.0–100.0)
Monocytes Absolute: 0.7 K/uL (ref 0.1–1.0)
Monocytes Relative: 3 %
Neutro Abs: 18.9 K/uL — ABNORMAL HIGH (ref 1.7–7.7)
Neutrophils Relative %: 87 %
Platelets: 478 K/uL — ABNORMAL HIGH (ref 150–400)
RBC: 2.96 MIL/uL — ABNORMAL LOW (ref 3.87–5.11)
RDW: 15.6 % — ABNORMAL HIGH (ref 11.5–15.5)
WBC: 21.8 K/uL — ABNORMAL HIGH (ref 4.0–10.5)
nRBC: 0 % (ref 0.0–0.2)

## 2023-12-27 LAB — URINALYSIS, W/ REFLEX TO CULTURE (INFECTION SUSPECTED)
Bilirubin Urine: NEGATIVE
Glucose, UA: NEGATIVE mg/dL
Ketones, ur: 5 mg/dL — AB
Nitrite: NEGATIVE
Protein, ur: 100 mg/dL — AB
RBC / HPF: >50 RBC/hpf (ref 0–5)
Specific Gravity, Urine: 1.013 (ref 1.005–1.030)
Squamous Epithelial / HPF: 0 /HPF (ref 0–5)
WBC, UA: >50 WBC/hpf (ref 0–5)
pH: 5 (ref 5.0–8.0)

## 2023-12-27 LAB — COMPREHENSIVE METABOLIC PANEL WITH GFR
ALT: 113 U/L — ABNORMAL HIGH (ref 0–44)
AST: 255 U/L — ABNORMAL HIGH (ref 15–41)
Albumin: <1.5 g/dL — ABNORMAL LOW (ref 3.5–5.0)
Alkaline Phosphatase: 228 U/L — ABNORMAL HIGH (ref 38–126)
Anion gap: 17 — ABNORMAL HIGH (ref 5–15)
BUN: 8 mg/dL (ref 6–20)
CO2: 12 mmol/L — ABNORMAL LOW (ref 22–32)
Calcium: 8.5 mg/dL — ABNORMAL LOW (ref 8.9–10.3)
Chloride: 100 mmol/L (ref 98–111)
Creatinine, Ser: 0.77 mg/dL (ref 0.44–1.00)
GFR, Estimated: >60 mL/min (ref >60)
Glucose, Bld: 204 mg/dL — ABNORMAL HIGH (ref 70–99)
Potassium: 3.3 mmol/L — ABNORMAL LOW (ref 3.5–5.1)
Sodium: 129 mmol/L — ABNORMAL LOW (ref 135–145)
Total Bilirubin: 1.6 mg/dL — ABNORMAL HIGH (ref 0.0–1.2)
Total Protein: 4.2 g/dL — ABNORMAL LOW (ref 6.5–8.1)

## 2023-12-27 LAB — BLOOD GAS, ARTERIAL
Acid-base deficit: 13.7 mmol/L — ABNORMAL HIGH (ref 0.0–2.0)
Bicarbonate: 11.7 mmol/L — ABNORMAL LOW (ref 20.0–28.0)
FIO2: 100 %
MECHVT: 440 mL
O2 Saturation: 99.3 %
PEEP: 8 cmH2O
Patient temperature: 37
RATE: 28 {breaths}/min
pCO2 arterial: 26 mmHg — ABNORMAL LOW (ref 32–48)
pH, Arterial: 7.26 — ABNORMAL LOW (ref 7.35–7.45)
pO2, Arterial: 266 mmHg — ABNORMAL HIGH (ref 83–108)

## 2023-12-27 LAB — BLOOD GAS, VENOUS
Acid-base deficit: 11.5 mmol/L — ABNORMAL HIGH (ref 0.0–2.0)
Bicarbonate: 14.4 mmol/L — ABNORMAL LOW (ref 20.0–28.0)
O2 Saturation: 77.3 %
Patient temperature: 37
pCO2, Ven: 32 mmHg — ABNORMAL LOW (ref 44–60)
pH, Ven: 7.26 (ref 7.25–7.43)
pO2, Ven: 46 mmHg — ABNORMAL HIGH (ref 32–45)

## 2023-12-27 LAB — RESP PANEL BY RT-PCR (RSV, FLU A&B, COVID)  RVPGX2
Influenza A by PCR: NEGATIVE
Influenza B by PCR: NEGATIVE
Resp Syncytial Virus by PCR: NEGATIVE
SARS Coronavirus 2 by RT PCR: POSITIVE — AB

## 2023-12-27 LAB — TSH: TSH: 6.172 u[IU]/mL — ABNORMAL HIGH (ref 0.350–4.500)

## 2023-12-27 LAB — PROTIME-INR
INR: 7.3 (ref 0.8–1.2)
Prothrombin Time: 65.3 s — ABNORMAL HIGH (ref 11.4–15.2)

## 2023-12-27 LAB — MRSA NEXT GEN BY PCR, NASAL: MRSA by PCR Next Gen: NOT DETECTED

## 2023-12-27 LAB — LACTIC ACID, PLASMA
Lactic Acid, Venous: 2.1 mmol/L (ref 0.5–1.9)
Lactic Acid, Venous: 2.7 mmol/L (ref 0.5–1.9)

## 2023-12-27 LAB — T4, FREE: Free T4: 0.5 ng/dL — ABNORMAL LOW (ref 0.61–1.12)

## 2023-12-27 LAB — PROCALCITONIN: Procalcitonin: 0.68 ng/mL

## 2023-12-27 LAB — PHOSPHORUS: Phosphorus: 2.1 mg/dL — ABNORMAL LOW (ref 2.5–4.6)

## 2023-12-27 LAB — TROPONIN I (HIGH SENSITIVITY): Troponin I (High Sensitivity): 193 ng/L (ref <18)

## 2023-12-27 LAB — LIPASE, BLOOD: Lipase: 16 U/L (ref 11–51)

## 2023-12-27 LAB — MAGNESIUM: Magnesium: 1.8 mg/dL (ref 1.7–2.4)

## 2023-12-27 MED ORDER — POLYETHYLENE GLYCOL 3350 17 G PO PACK
17.0000 g | PACK | Freq: Every day | ORAL | Status: DC | PRN
Start: 2023-12-27 — End: 2023-12-28

## 2023-12-27 MED ORDER — VANCOMYCIN HCL IN DEXTROSE 1-5 GM/200ML-% IV SOLN
1000.0000 mg | INTRAVENOUS | Status: DC
  Filled 2023-12-27: qty 200

## 2023-12-27 MED ORDER — AMIODARONE IV BOLUS ONLY 150 MG/100ML
INTRAVENOUS | Status: AC
Start: 2023-12-27 — End: 2023-12-28
  Filled 2023-12-27: qty 100

## 2023-12-27 MED ORDER — HEPARIN (PORCINE) 25000 UT/250ML-% IV SOLN
900.0000 [IU]/h | INTRAVENOUS | Status: DC
  Administered 2023-12-27: 1100 [IU]/h via INTRAVENOUS
  Filled 2023-12-27: qty 250

## 2023-12-27 MED ORDER — CHLORHEXIDINE GLUCONATE CLOTH 2 % EX PADS
6.0000 | MEDICATED_PAD | Freq: Every day | CUTANEOUS | Status: DC
  Administered 2023-12-27 – 2023-12-28 (×2): 6 via TOPICAL
  Filled 2023-12-27: qty 6

## 2023-12-27 MED ORDER — KETAMINE HCL 50 MG/5ML IJ SOSY
PREFILLED_SYRINGE | INTRAMUSCULAR | Status: AC
  Filled 2023-12-27: qty 5

## 2023-12-27 MED ORDER — NOREPINEPHRINE 4 MG/250ML-% IV SOLN
0.0000 ug/min | INTRAVENOUS | Status: DC
  Administered 2023-12-27: 12 ug/min via INTRAVENOUS
  Administered 2023-12-27: 25 ug/min via INTRAVENOUS
  Filled 2023-12-27 (×3): qty 250

## 2023-12-27 MED ORDER — IPRATROPIUM-ALBUTEROL 0.5-2.5 (3) MG/3ML IN SOLN
3.0000 mL | Freq: Four times a day (QID) | RESPIRATORY_TRACT | Status: DC
  Administered 2023-12-28 (×3): 3 mL via RESPIRATORY_TRACT
  Filled 2023-12-27 (×3): qty 3

## 2023-12-27 MED ORDER — SODIUM CHLORIDE 0.9 % IV BOLUS (SEPSIS)
2000.0000 mL | Freq: Once | INTRAVENOUS | Status: AC
  Administered 2023-12-27: 2000 mL via INTRAVENOUS

## 2023-12-27 MED ORDER — HEPARIN BOLUS VIA INFUSION
4000.0000 [IU] | Freq: Once | INTRAVENOUS | Status: AC
  Administered 2023-12-27: 4000 [IU] via INTRAVENOUS
  Filled 2023-12-27: qty 4000

## 2023-12-27 MED ORDER — SODIUM CHLORIDE 0.9 % IV BOLUS (SEPSIS)
500.0000 mL | Freq: Once | INTRAVENOUS | Status: AC
  Administered 2023-12-27: 500 mL via INTRAVENOUS

## 2023-12-27 MED ORDER — AMIODARONE HCL IN DEXTROSE 360-4.14 MG/200ML-% IV SOLN: 30.0000 mg/h | INTRAVENOUS | Status: DC

## 2023-12-27 MED ORDER — ROCURONIUM BROMIDE 10 MG/ML (PF) SYRINGE
PREFILLED_SYRINGE | INTRAVENOUS | Status: AC
  Filled 2023-12-27: qty 10

## 2023-12-27 MED ORDER — CIPROFLOXACIN IN D5W 400 MG/200ML IV SOLN
400.0000 mg | Freq: Two times a day (BID) | INTRAVENOUS | Status: DC
  Administered 2023-12-27: 400 mg via INTRAVENOUS
  Filled 2023-12-27 (×3): qty 200

## 2023-12-27 MED ORDER — HYDROCORTISONE SOD SUC (PF) 100 MG IJ SOLR
100.0000 mg | Freq: Once | INTRAMUSCULAR | Status: AC
  Administered 2023-12-27: 100 mg via INTRAVENOUS
  Filled 2023-12-27: qty 2

## 2023-12-27 MED ORDER — SODIUM CHLORIDE 0.9 % IV SOLN
2.0000 g | Freq: Once | INTRAVENOUS | Status: AC
  Administered 2023-12-27: 2 g via INTRAVENOUS
  Filled 2023-12-27: qty 12.5

## 2023-12-27 MED ORDER — VANCOMYCIN HCL 1500 MG/300ML IV SOLN
1500.0000 mg | Freq: Once | INTRAVENOUS | Status: AC
  Administered 2023-12-28: 1500 mg via INTRAVENOUS
  Filled 2023-12-27: qty 300

## 2023-12-27 MED ORDER — ROCURONIUM BROMIDE 10 MG/ML (PF) SYRINGE: 80.0000 mg | PREFILLED_SYRINGE | Freq: Once | INTRAVENOUS | Status: AC

## 2023-12-27 MED ORDER — AMIODARONE HCL IN DEXTROSE 360-4.14 MG/200ML-% IV SOLN
INTRAVENOUS | Status: AC
  Administered 2023-12-27: 60 mg/h via INTRAVENOUS
  Filled 2023-12-27: qty 200

## 2023-12-27 MED ORDER — NOREPINEPHRINE 16 MG/250ML-% IV SOLN
0.0000 ug/min | INTRAVENOUS | Status: DC
  Administered 2023-12-27 – 2023-12-28 (×3): 40 ug/min via INTRAVENOUS
  Filled 2023-12-27 (×4): qty 250

## 2023-12-27 MED ORDER — INSULIN ASPART 100 UNIT/ML IJ SOLN
0.0000 [IU] | INTRAMUSCULAR | Status: DC
  Administered 2023-12-27: 3 [IU] via SUBCUTANEOUS
  Administered 2023-12-28: 9 [IU] via SUBCUTANEOUS
  Administered 2023-12-28: 6 [IU] via SUBCUTANEOUS
  Administered 2023-12-28: 2 [IU] via SUBCUTANEOUS
  Filled 2023-12-27 (×5): qty 1

## 2023-12-27 MED ORDER — SODIUM BICARBONATE 8.4 % IV SOLN
INTRAVENOUS | Status: AC
  Administered 2023-12-27: 100 meq
  Filled 2023-12-27: qty 100

## 2023-12-27 MED ORDER — ROCURONIUM BROMIDE 10 MG/ML (PF) SYRINGE
100.0000 mg | PREFILLED_SYRINGE | Freq: Once | INTRAVENOUS | Status: AC
  Administered 2023-12-27: 80 mg via INTRAVENOUS

## 2023-12-27 MED ORDER — METHYLENE BLUE (ANTIDOTE) 1 % IV SOLN
0.5000 mg/kg/h | INTRAVENOUS | Status: DC
  Administered 2023-12-28 (×3): 0.5 mg/kg/h via INTRAVENOUS
  Filled 2023-12-27 (×3): qty 25

## 2023-12-27 MED ORDER — VASOPRESSIN 20 UNITS/100 ML INFUSION FOR SHOCK
0.0000 [IU]/min | INTRAVENOUS | Status: DC
  Administered 2023-12-27: 0.03 [IU]/min via INTRAVENOUS
  Administered 2023-12-28 (×2): 0.04 [IU]/min via INTRAVENOUS
  Filled 2023-12-27 (×4): qty 100

## 2023-12-27 MED ORDER — SODIUM CHLORIDE 0.9 % IV SOLN
2.0000 g | Freq: Three times a day (TID) | INTRAVENOUS | Status: DC
  Administered 2023-12-28 (×2): 2 g via INTRAVENOUS
  Filled 2023-12-27 (×4): qty 12.5

## 2023-12-27 MED ORDER — IOHEXOL 350 MG/ML SOLN
100.0000 mL | Freq: Once | INTRAVENOUS | Status: AC | PRN
  Administered 2023-12-27: 100 mL via INTRAVENOUS

## 2023-12-27 MED ORDER — KETAMINE HCL 50 MG/5ML IJ SOSY
1.5000 mg/kg | PREFILLED_SYRINGE | Freq: Once | INTRAMUSCULAR | Status: AC
  Administered 2023-12-27: 100 mg via INTRAVENOUS

## 2023-12-27 MED ORDER — MAGNESIUM SULFATE IN D5W 1-5 GM/100ML-% IV SOLN
1.0000 g | Freq: Once | INTRAVENOUS | Status: AC
  Administered 2023-12-27: 1 g via INTRAVENOUS
  Filled 2023-12-27: qty 100

## 2023-12-27 MED ORDER — DOCUSATE SODIUM 100 MG PO CAPS: 100.0000 mg | ORAL_CAPSULE | Freq: Two times a day (BID) | ORAL | Status: DC | PRN

## 2023-12-27 MED ORDER — POTASSIUM CHLORIDE 10 MEQ/100ML IV SOLN
10.0000 meq | INTRAVENOUS | Status: AC
  Administered 2023-12-27 – 2023-12-28 (×6): 10 meq via INTRAVENOUS
  Filled 2023-12-27 (×6): qty 100

## 2023-12-27 MED ORDER — AMIODARONE HCL IN DEXTROSE 360-4.14 MG/200ML-% IV SOLN: 60.0000 mg/h | INTRAVENOUS | Status: AC

## 2023-12-27 MED ORDER — SODIUM CHLORIDE 0.9 % IV SOLN
250.0000 mL | INTRAVENOUS | Status: AC
  Administered 2023-12-27: 250 mL via INTRAVENOUS

## 2023-12-27 MED ORDER — IPRATROPIUM-ALBUTEROL 0.5-2.5 (3) MG/3ML IN SOLN: 3.0000 mL | Freq: Four times a day (QID) | RESPIRATORY_TRACT | Status: DC | PRN

## 2023-12-27 MED ORDER — AMIODARONE LOAD VIA INFUSION
150.0000 mg | Freq: Once | INTRAVENOUS | Status: AC
  Administered 2023-12-27: 150 mg via INTRAVENOUS
  Filled 2023-12-27: qty 83.34

## 2023-12-27 MED ORDER — EPINEPHRINE HCL 5 MG/250ML IV SOLN IN NS
0.5000 ug/min | INTRAVENOUS | Status: DC
  Administered 2023-12-27: 0.5 ug/min via INTRAVENOUS
  Administered 2023-12-28: 20 ug/min via INTRAVENOUS
  Administered 2023-12-28: 18 ug/min via INTRAVENOUS
  Administered 2023-12-28: 20 ug/min via INTRAVENOUS
  Filled 2023-12-27 (×4): qty 250

## 2023-12-27 MED ORDER — KETAMINE HCL 50 MG/5ML IJ SOSY: 1.5000 mg/kg | PREFILLED_SYRINGE | Freq: Once | INTRAMUSCULAR | Status: AC

## 2023-12-27 MED ORDER — METHYLENE BLUE (ANTIDOTE) 1 % IV SOLN
1.0000 mg/kg | Freq: Once | INTRAVENOUS | Status: AC
  Administered 2023-12-28: 80 mg via INTRAVENOUS
  Filled 2023-12-27: qty 8

## 2023-12-27 NOTE — ED Notes (Signed)
 Pt has io in L humeral head placed by ems, dressing placed, io has blood return and flush with ease, checking with pharmacy if abx can go IO

## 2023-12-27 NOTE — ED Notes (Signed)
 IV placed in L upper arm on second attempt with ultrasound minimal blood return, minimal amounts in blood culture bottles, fluids and levo up and infusing, d/c ems levo and fluids.  Md at bedside for central line

## 2023-12-27 NOTE — Progress Notes (Signed)
 Pharmacy Antibiotic Note  Ruth Mayo is a 63 y.o. female w/ PMH of PMH including T2DM, hypothyroidism, sacral osteomyelitis, CAD status post CABG, previous DVT/PE, chronic opiate use, and history of sacral decubitus with osteomyelitis admitted on 12/27/2023 with presenting with severe sepsis and hypotension requiring vasopressor support.  Pharmacy has been consulted for ciprofloxacin , cefepime  and vancomycin  dosing.  Plan:  1) start ciprofloxacin  400 mg IV q12h  2) start cefepime  2 grams IV every 8 hours  3) start vancomycin  1500 mg IV x 1 then 1000 mg IV every 24 hours Goal AUC 400-550. Expected AUC: 510.4 SCr used: 0.80 mg/dL (rounded up)   Height: 5' (152.4 cm) Weight: 80.3 kg (177 lb) IBW/kg (Calculated) : 45.5  Temp (24hrs), Avg:95 F (35 C), Min:95 F (35 C), Max:95 F (35 C)  Recent Labs  Lab 12/21/23 0601 12/22/23 0531 12/23/23 0418 12/27/23 1759 12/27/23 1918  WBC 5.0  --   --  21.8*  --   CREATININE <0.30* <0.30* <0.30* 0.77  --   LATICACIDVEN  --   --   --  2.1* 2.7*    Estimated Creatinine Clearance: 70.1 mL/min (by C-G formula based on SCr of 0.77 mg/dL).    Not on File  Antimicrobials this admission: 08/23 ciprofloxacin  >>  08/23 cefepime  >>  08/23 vancomycin  >>  Microbiology results: 08/23 BCx: pending 08/23 UCx: pending  08/23 SARS CoV-2 positive  Thank you for allowing pharmacy to be a part of this patient's care.  Ruth Mayo 12/27/2023 9:32 PM

## 2023-12-27 NOTE — Procedures (Signed)
 ARTERIAL CATHETER INSERTION PROCEDURE NOTE  Josee Speece  [id number]  1961/01/02  Date:12/27/23  Time:10:27 PM   Provider Performing: Almarie DELENA Nose   Procedure: Insertion of Arterial Line (63379) with US  guidance (23062)   Indication(s) Blood pressure monitoring and/or need for frequent ABGs  Consent Unable to obtain consent due to emergent nature of procedure.  Anesthesia None  Time Out Verified patient identification, verified procedure, site/side was marked, verified correct patient position, special equipment/implants available, medications/allergies/relevant history reviewed, required imaging and test results available.  Sterile Technique Maximal sterile technique including full sterile barrier drape, hand hygiene, sterile gown, sterile gloves, mask, hair covering, sterile ultrasound probe cover (if used).  Procedure Description Area of catheter insertion was cleaned with chlorhexidine  and draped in sterile fashion. With real-time ultrasound guidance an arterial catheter was placed into the right femoral artery.  Appropriate arterial tracings confirmed on monitor.    Complications/Tolerance None; patient tolerated the procedure well.  EBL Minimal  Specimen(s) None   Almarie Nose, DNP, CCRN, FNP-C, AGACNP-BC Acute Care & Family Nurse Practitioner  [facility]  See Amion for personal pager PCCM on call pager [phone] until 7 am

## 2023-12-27 NOTE — Consult Note (Signed)
 Pharmacy Consult Note - Electrolytes  Sodium (mmol/L)  Date Value  12/27/2023 129 (L)  01/02/2018 137   Potassium (mmol/L)  Date Value  12/27/2023 3.3 (L)   Magnesium  (mg/dL)  Date Value  91/76/7974 1.8   Calcium  (mg/dL)  Date Value  91/76/7974 8.5 (L)   Calcium , Total (PTH) (mg/dL)  Date Value  89/70/7975 9.7   Albumin (g/dL)  Date Value  91/76/7974 <1.5 (L)  01/02/2018 3.8   Phosphorus (mg/dL)  Date Value  91/76/7974 2.1 (L)    ASSESSMENT: 63 y.o. female with PMH including T2DM, hypothyroidism, sacral osteomyelitis, CAD presenting with severe sepsis and hypotension requiring vasopressor support. Lactate elevated at 2.7. Soon after arrival was in vtach, s/p amiodarone  bolus and synchronized cardioversion. Pharmacy has been consulted to monitor and replace electrolytes.  Lab Results  Component Value Date   CREATININE 0.77 12/27/2023   CREATININE <0.30 (L) 12/23/2023   CREATININE <0.30 (L) 12/22/2023    IVF: 1000mL NS boluses x 2 so far   Pertinent medications: n/a  Goal of Therapy:  K >=4.0 and Mg >=2.0  PLAN: K 3.3 >> potassium chloride  10mEq IV x 6 Mg 1.8 >> magnesium  sulfate 1g IV x 1 Check electrolytes with next AM labs   Thank you for allowing pharmacy to be a part of this patient's care.  Will M. Lenon, PharmD Clinical Pharmacist 12/27/2023 8:18 PM

## 2023-12-27 NOTE — Progress Notes (Addendum)
 eLink Physician-Brief Progress Note Patient Name: Ruth Mayo DOB: 01-31-1961 MRN: [id number]   Date of Service  12/27/2023  HPI/Events of Note  63 y.o. female with PMH including T2DM, hypothyroidism, sacral osteomyelitis, CAD status post CABG, previous DVT/PE, chronic opiate use, and history of sacral decubitus with osteomyelitis presenting with severe sepsis and hypotension requiring vasopressor support.  Patient is hypothermic, tachycardic, hypotensive saturating 70% on nasal cannula.  Patient is status post amiodarone  bolus and infusion currently on norepinephrine , vasopressin , and epinephrine .  Results consistent with hypokalemia, transaminitis, lactic acidosis and leukocytosis.  Anemia present.  COVID-positive.  Urinalysis grossly positive..  Some bladder wall thickening present without hydronephrosis or perinephric stranding.  Multifocal nodular airspace disease present. PE present.  eICU Interventions  Maintain broad-spectrum antibiotics, cefepime  ordered.  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  Contact precautions.  Maintain multi pressor support with vancomycin , vasopressin , stress dose steroids.  +2.2 L crystalloids.  Central line pending to replace IO.  Trend LFTs, lactic, add on coagulation studies.  Undergoing emergent intubation as code blue was called.  Bedside staff intubating, pulses intact.  DVT prophylaxis with therapeutic heparin  GI prophylaxis not currently indicated   2152 -still early vasoplegic with significant ppv, additional 500 cc NS bolus.  Pending repeat ABG.  [id number] -sustaining for now with multiorgan dysfunction with severe mental Bolick acidosis.  Maintain bicarb drip, increase dose for 1 L then transition back to 125 mL/h   Intervention Category Evaluation Type: New Patient Evaluation  Dhana Totton 12/27/2023, 8:44 PM

## 2023-12-27 NOTE — Consult Note (Signed)
 CODE SEPSIS - PHARMACY COMMUNICATION  **Broad Spectrum Antibiotics should be administered within 1 hour of Sepsis diagnosis**  Time Code Sepsis Called/Page Received: 1743  Antibiotics Ordered: Cefepime   Time of 1st antibiotic administration: 1823  Additional action taken by pharmacy: N/A  Kayla JULIANNA Blew ,PharmD Clinical Pharmacist  12/27/2023  5:51 PM

## 2023-12-27 NOTE — Sepsis Progress Note (Addendum)
 Elink following code sepis  1844 MD messaged asking for additional fluids to meet fluid recommendation based on documented weight, per MD pt go 500 ml of fluids from EMS meeting recommended volume

## 2023-12-27 NOTE — ED Notes (Signed)
 Amiodarone  stopped per NP

## 2023-12-27 NOTE — H&P (Signed)
 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  On chart review, patient was recently hospitalized with sepsis secondary to UTI with cultures growing Pseudomonas aeruginosa's.  She was discharged on ciprofloxacin  and per husband she had a chest x-ray done on Wednesday which showed pneumonia and was started on antibiotics with no improvement.  EMS was called today for worsening mental status and hypotension.  On EMS arrival patient was found with profound hypotension requiring Levophed  drip.  ED Course: Initial vital signs showed HR of 100 beats/minute, BP 51/39 mm Hg, the RR 24 breaths/minute, and the oxygen saturation 99% on 4 L and a temperature of 7F (35C). Pertinent Labs/Diagnostics Findings: Na+/ K+:129/3.3 Glucose: 204 BUN/Cr.:8/0.77 AST/ALT:255/113, alkphos 228 WBC: 21.8K/L  Hgb/Hct:8.5/27.5 Plts:478  PCT: 0.68  Lactic acid:2.1  COVID PCR: positive,  troponin:193   VBG: pO2 46; pCO2 32; pH 7.26;  HCO3 14.4, %O2 Sat 77.3.  CXR> CTH>neg CTA Chest> CT Abd/pelvis> pending  Patient given 30 cc/kg of fluids and started on broad-spectrum antibiotics cefepime  for suspected sepsis with septic shock iso COVID pneumonia, UTI and wound infection. Patient remained  hypotensive despite IVF boluses therefore was started on Levophed .  During central line placement, patient went into V. tach requiring synchronized cardioversion at 200 J with restoration of rhythm to A-fib with RVR.  She was started on amiodarone  drip for rate control but she remained hypotensive requiring additional vasopressin  and epinephrine  drip to keep MAP >65.  PCCM consulted.  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 Developed Acute respiratory failure and went into Brief VF/VT cardiac arrest requiring emergent endotracheal intubation  Consults:  Cardiology  Procedures:  8/23: Endotracheal intubation 8/23: Left IJ central line 8/23: Right femoral art line  Interim History / Subjective:    -Endotracheally intubated  Micro Data:  8/23: SARS-CoV-2 PCR> positive  8/23: Influenza PCR> negative 8/23:RVP> 8/23: Blood culture x2> 8/23: Urine Culture> 8/23: MRSA PCR>>  8/23: Strep pneumo urinary antigen> 8/23: Legionella urinary antigen>  Antimicrobials:  Vancomycin  8/23> Cefepime  8/23> Ciprofloxacin  8/23>  OBJECTIVE  Blood pressure (!) 71/35, pulse 79, temperature (!) 92.3 F (33.5 C), resp. rate (!) 28, height  5' (1.524 m), weight 80.3 kg, SpO2 93%.    Vent Mode: PRVC FiO2 (%):  [80 %-100 %] 80 % Set Rate:  [28 bmp] 28 bmp Vt Set:  [440 mL] 440 mL PEEP:  [6 cmH20-8 cmH20] 6 cmH20   Intake/Output Summary (Last 24 hours) at January 17, 2024 0147 Last data filed at 01/17/2024 0029 Gross per 24 hour  Intake 4329.41 ml  Output 200 ml  Net 4129.41 ml   Filed Weights   12/27/23 1737  Weight: 80.3 kg   Physical Examination  GENERAL: 63 year-old female critically ill patient intubated EYES: PEERLA. No scleral icterus. Extraocular muscles intact.  HEENT: Head atraumatic, normocephalic. Oropharynx and nasopharynx clear.  CARDIOVASCULAR:irregular, Afib with RVR PULMONARY: Rhonchorous lung sounds bilaterally ABDOMINAL: Soft, NTND G-Tube in place MUSCULOSKELETAL: Dependent edema, 2+ pitting edema NEUROLOGICAL:No focal deficit present.  UTA PSYCHIATRIC:  UTA SKIN:+ Skin laceration, ulcers  Labs/imaging that I havepersonally reviewed  (right click and Reselect all SmartList Selections daily)        Portable Chest x-ray Result Date: 12/27/2023 CLINICAL DATA:  [id number] Endotracheal tube present [id number] EXAM: PORTABLE CHEST 1 VIEW COMPARISON:  Chest x-ray 12/27/2023 7:28 p.m. FINDINGS: Interval placement of an endotracheal tube with tip terminating 2.5 cm above the carina. Left internal jugular central venous catheter with tip overlying the expected region of the superior caval junction. Cardiac paddles overlie the chest. Lines and tubes overlie the chest. The heart and mediastinal contours are unchanged. Vascular clips overlie the mediastinum. No focal consolidation. No pulmonary edema. No pleural effusion. No pneumothorax. No acute osseous abnormality. IMPRESSION: 1. No active disease. 2. Endotracheal tube and left internal jugular central venous catheter in appropriate position. Electronically Signed   By: Morgane  Naveau M.D.   On: 12/27/2023 22:44   CT Angio Chest PE W and/or Wo Contrast Result Date:  12/27/2023 CLINICAL DATA:  Hypotension, altered mental status and suspected sepsis and or pulmonary embolism. EXAM: CT ANGIOGRAPHY CHEST CT ABDOMEN AND PELVIS WITH CONTRAST TECHNIQUE: Multidetector CT imaging of the chest was performed using the standard protocol during bolus administration of intravenous contrast. Multiplanar CT image reconstructions and MIPs were obtained to evaluate the vascular anatomy. Multidetector CT imaging of the abdomen and pelvis was performed using the standard protocol during bolus administration of intravenous contrast. RADIATION DOSE REDUCTION: This exam was performed according to the departmental dose-optimization program which includes automated exposure control, adjustment of the mA and/or kV according to patient size and/or use of iterative reconstruction technique. CONTRAST:  OMNIPAQUE  IOHEXOL  350 MG/ML SOLN COMPARISON:  Portable chest today, chest radiograph 03/24/2023, and CTs of abdomen and pelvis with IV contrast 12/20/2023, 12/01/2023 and 10/17/2023. FINDINGS: CTA CHEST FINDINGS Cardiovascular: There are CABG changes and a healed median sternotomy, heavy native calcific CAD. There is no pericardial effusion. The cardiac size is normal. Pulmonary veins are nondistended. Left IJ central line terminates at the superior cavoatrial junction. There is atherosclerosis in the aortic arch and great vessels without aortic aneurysm, stenosis or dissection. 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 Arterial opacification is diagnostic. There is mild respiratory motion on exam. The pulmonary trunk is upper limit of normal in  caliber and unchanged. There is a near occlusive acute clot in the left lower lobe main artery. Some of the small caliber downstream basilar subsegmental branches probably also harbor  clot but are not well seen. Also noted is a small nonocclusive clot in the distal lingular main artery on 4:70. The remaining pulmonary arteries  appear clear. This is an overall small clot burden without findings of acute right heart strain. Mediastinum/Nodes: Mediastinal lipomatosis appears similar, exaggerating the superior mediastinum. Slightly prominent low right paratracheal lymph node is 1.1 cm in short axis. There is no further adenopathy. There are scattered shotty subcentimeter lymph nodes in the superior mediastinum. Negative axillary spaces, thyroid , thoracic esophagus is patulous with normal wall thickness. There is moderate focal tracheal narrowing at the level of T1-2 which could be due to scarring related to prior intubation trauma. This is at the level of the thyroid , but the thyroid  gland is not enlarged and should not be compressing the trachea. The thoracic trachea is otherwise unremarkable. Both of the main bronchi are clear. Lungs/Pleura: There is patchy nodular airspace disease in the superior segment of the left lower lobe, seen on all prior studies and may reflect evidence of chronic aspiration. There is chronic atelectasis in the posterior lung bases. There is moderate chronic elevation right diaphragm. There is increased band consolidation in the superior segment of the right lower lobe which could be due to atelectasis, pneumonia or aspiration. There is new airspace disease in the dorsal lingula consistent with pneumonia or aspiration. Mild diffuse bronchial thickening. Minimal left and trace right pleural effusions are also new. Above the plane of prior studies there is a rounded 6 mm subpleural right upper lobe nodule posteriorly on 6:51. There is asymmetric right apical patchy opacity which could be additional pneumonia or asymmetric scarring. Rest of the lungs appear generally clear.  No edema is seen. Musculoskeletal: Chronic appearance of mild central upper plate compression fracture deformities of C7, T1, and T2. Thoracic spondylosis. There is mild osteopenia. No acute or other significant osseous abnormality. There is edema  in the chest wall which is probably due to third spacing and fluid overload. No chest wall mass is evident. Review of the MIP images confirms the above findings. CT ABDOMEN and PELVIS FINDINGS Hepatobiliary: Moderate to severe increased hepatic steatosis. No mass. Limited fine detail due to breathing motion. There is a 1.2 cm noncalcified stone versus tumefactive sludge in the gallbladder 11:30. No wall thickening or biliary dilatation. Pancreas: Generalized atrophy. There are some edematous changes at the level of the pancreas but is difficult to tell if this is part of the generalized mesenteric congestion noted present or if this is due to coexisting pancreatitis. Laboratory and clinical correlation recommended. There is no pancreatic mass or ductal dilatation. Spleen: No abnormality. Adrenals/Urinary Tract: No adrenal or renal mass enhancement. No evidence of renal obstruction. Fall single 1 mm caliceal stone lower pole right kidney is noted and occasional 1-2 mm stones in left renal collecting system. No ureteral stone. The bladder is catheterized and contracted, not well seen. There are perivesical stranding changes however, concerning for cystitis. Stomach/Bowel: Mild thickened folds in fluid filling of the stomach. There are thickened folds in the duodenum. Rest of the small bowel is unremarkable. There is a normal appendix. There is fluid in the colon without wall thickening or inflammation. Gastrostomy tube again noted with the retention balloon in the body of stomach. Vascular/Lymphatic: Heavy aortic and branch vessel atherosclerosis. No AAA. No adenopathy. Reproductive: Uterus and  bilateral adnexa are unremarkable. Other: Minimal ascites. A similar body wall anasarca to the last CT. Mesenteric congestion is similar to August 16. No free air. Stranding in the lower presacral space is also similar, probably related to a large sacral decubitus ulcer known to be present. Musculoskeletal: Sizable sacral  decubitus ulcer right of the midline again noted with underlying sacrococcygeal destruction beginning at about S4 eccentric to the right. No new abnormality. There are extensive heterotopic bone formations above both hips. Osteopenia and degenerative change lumbar spine. Review of the MIP images confirms the above findings. IMPRESSION: 1. Acute near occlusive clot in the left lower lobe main artery, with likely extension into some of the downstream subsegmental arteries, and small nonocclusive clot in the distal lingular main artery. The remaining pulmonary arteries appear clear. This is an overall small clot burden without findings of acute right heart strain. 2. Increased airspace disease in the superior segment of the right lower lobe and dorsal lingula consistent with pneumonia or aspiration. 3. Chronic nodular airspace disease in the superior segment of the left lower lobe which may reflect chronic aspiration. 4. Minimal left and trace right pleural effusions, new. 5. 6 mm right upper lobe nodule. Non-contrast chest CT at 6-12 months is recommended. If the nodule is stable at time of repeat CT, then future CT at 18-24 months (from today's scan) is considered optional for low-risk patients, but is recommended for high-risk patients. This recommendation follows the consensus statement: Guidelines for Management of Incidental Pulmonary Nodules Detected on CT Images: From the Fleischner Society 2017; Radiology 2017; 284:228-243. 6. Aortic and coronary artery atherosclerosis.  Prior CABG. 7. 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 8. Moderate focal tracheal narrowing at the level of T1-2 which could be due to scarring related to prior intubation trauma. 9. Increased hepatic steatosis. 10. Noncalcified stone versus tumefactive sludge in the gallbladder. No wall thickening or biliary dilatation. 11. Nonobstructive nephrolithiasis. 12. Edematous changes at the level of the pancreas which could be  part of the generalized mesenteric congestion noted present or due to coexisting pancreatitis. 13. Likely cystitis. 14. Fluid in the colon without wall thickening or inflammation. 15. Gastrostomy tube in place. 16. Sizable sacral decubitus ulcer right of the midline with underlying sacrococcygeal destruction beginning at about S4 eccentric to the right. Aortic Atherosclerosis (ICD10-I70.0). Electronically Signed   By: Francis Quam M.D.   On: 12/27/2023 21:31   CT ABDOMEN PELVIS W CONTRAST Result Date: 12/27/2023 CLINICAL DATA:  Hypotension, altered mental status and suspected sepsis and or pulmonary embolism. EXAM: CT ANGIOGRAPHY CHEST CT ABDOMEN AND PELVIS WITH CONTRAST TECHNIQUE: Multidetector CT imaging of the chest was performed using the standard protocol during bolus administration of intravenous contrast. Multiplanar CT image reconstructions and MIPs were obtained to evaluate the vascular anatomy. Multidetector CT imaging of the abdomen and pelvis was performed using the standard protocol during bolus administration of intravenous contrast. RADIATION DOSE REDUCTION: This exam was performed according to the departmental dose-optimization program which includes automated exposure control, adjustment of the mA and/or kV according to patient size and/or use of iterative reconstruction technique. CONTRAST:  OMNIPAQUE  IOHEXOL  350 MG/ML SOLN COMPARISON:  Portable chest today, chest radiograph 03/24/2023, and CTs of abdomen and pelvis with IV contrast 12/20/2023, 12/01/2023 and 10/17/2023. FINDINGS: CTA CHEST FINDINGS Cardiovascular: There are CABG changes and a healed median sternotomy, heavy native calcific CAD. There is no pericardial effusion. The cardiac size is normal. Pulmonary veins are nondistended. Left IJ central line  terminates at the superior cavoatrial junction. There is atherosclerosis in the aortic arch and great vessels without aortic aneurysm, stenosis or dissection. There is a 70%  calcific origin stenosis of the left subclavian artery. Arterial opacification is diagnostic. There is mild respiratory motion on exam. The pulmonary trunk is upper limit of normal in caliber and unchanged. There is a near occlusive acute clot in the left lower lobe main artery. Some of the small caliber downstream basilar subsegmental branches probably also harbor  clot but are not well seen. Also noted is a small nonocclusive clot in the distal lingular main artery on 4:70. The remaining pulmonary arteries appear clear. This is an overall small clot burden without findings of acute right heart strain. Mediastinum/Nodes: Mediastinal lipomatosis appears similar, exaggerating the superior mediastinum. Slightly prominent low right paratracheal lymph node is 1.1 cm in short axis. There is no further adenopathy. There are scattered shotty subcentimeter lymph nodes in the superior mediastinum. Negative axillary spaces, thyroid , thoracic esophagus is patulous with normal wall thickness. There is moderate focal tracheal narrowing at the level of T1-2 which could be due to scarring related to prior intubation trauma. This is at the level of the thyroid , but the thyroid  gland is not enlarged and should not be compressing the trachea. The thoracic trachea is otherwise unremarkable. Both of the main bronchi are clear. Lungs/Pleura: There is patchy nodular airspace disease in the superior segment of the left lower lobe, seen on all prior studies and may reflect evidence of chronic aspiration. There is chronic atelectasis in the posterior lung bases. There is moderate chronic elevation right diaphragm. There is increased band consolidation in the superior segment of the right lower lobe which could be due to atelectasis, pneumonia or aspiration. There is new airspace disease in the dorsal lingula consistent with pneumonia or aspiration. Mild diffuse bronchial thickening. Minimal left and trace right pleural effusions are also  new. Above the plane of prior studies there is a rounded 6 mm subpleural right upper lobe nodule posteriorly on 6:51. There is asymmetric right apical patchy opacity which could be additional pneumonia or asymmetric scarring. Rest of the lungs appear generally clear.  No edema is seen. Musculoskeletal: Chronic appearance of mild central upper plate compression fracture deformities of C7, T1, and T2. Thoracic spondylosis. There is mild osteopenia. No acute or other significant osseous abnormality. There is edema in the chest wall which is probably due to third spacing and fluid overload. No chest wall mass is evident. Review of the MIP images confirms the above findings. CT ABDOMEN and PELVIS FINDINGS Hepatobiliary: Moderate to severe increased hepatic steatosis. No mass. Limited fine detail due to breathing motion. There is a 1.2 cm noncalcified stone versus tumefactive sludge in the gallbladder 11:30. No wall thickening or biliary dilatation. Pancreas: Generalized atrophy. There are some edematous changes at the level of the pancreas but is difficult to tell if this is part of the generalized mesenteric congestion noted present or if this is due to coexisting pancreatitis. Laboratory and clinical correlation recommended. There is no pancreatic mass or ductal dilatation. Spleen: No abnormality. Adrenals/Urinary Tract: No adrenal or renal mass enhancement. No evidence of renal obstruction. Fall single 1 mm caliceal stone lower pole right kidney is noted and occasional 1-2 mm stones in left renal collecting system. No ureteral stone. The bladder is catheterized and contracted, not well seen. There are perivesical stranding changes however, concerning for cystitis. Stomach/Bowel: Mild thickened folds in fluid filling of the stomach. There are thickened  folds in the duodenum. Rest of the small bowel is unremarkable. There is a normal appendix. There is fluid in the colon without wall thickening or inflammation.  Gastrostomy tube again noted with the retention balloon in the body of stomach. Vascular/Lymphatic: Heavy aortic and branch vessel atherosclerosis. No AAA. No adenopathy. Reproductive: Uterus and bilateral adnexa are unremarkable. Other: Minimal ascites. A similar body wall anasarca to the last CT. Mesenteric congestion is similar to August 16. No free air. Stranding in the lower presacral space is also similar, probably related to a large sacral decubitus ulcer known to be present. Musculoskeletal: Sizable sacral decubitus ulcer right of the midline again noted with underlying sacrococcygeal destruction beginning at about S4 eccentric to the right. No new abnormality. There are extensive heterotopic bone formations above both hips. Osteopenia and degenerative change lumbar spine. Review of the MIP images confirms the above findings. IMPRESSION: 1. Acute near occlusive clot in the left lower lobe main artery, with likely extension into some of the downstream subsegmental arteries, and small nonocclusive clot in the distal lingular main artery. The remaining pulmonary arteries appear clear. This is an overall small clot burden without findings of acute right heart strain. 2. Increased airspace disease in the superior segment of the right lower lobe and dorsal lingula consistent with pneumonia or aspiration. 3. Chronic nodular airspace disease in the superior segment of the left lower lobe which may reflect chronic aspiration. 4. Minimal left and trace right pleural effusions, new. 5. 6 mm right upper lobe nodule. Non-contrast chest CT at 6-12 months is recommended. If the nodule is stable at time of repeat CT, then future CT at 18-24 months (from today's scan) is considered optional for low-risk patients, but is recommended for high-risk patients. This recommendation follows the consensus statement: Guidelines for Management of Incidental Pulmonary Nodules Detected on CT Images: From the Fleischner Society 2017;  Radiology 2017; 284:228-243. 6. Aortic and coronary artery atherosclerosis.  Prior CABG. 7. 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 8. Moderate focal tracheal narrowing at the level of T1-2 which could be due to scarring related to prior intubation trauma. 9. Increased hepatic steatosis. 10. Noncalcified stone versus tumefactive sludge in the gallbladder. No wall thickening or biliary dilatation. 11. Nonobstructive nephrolithiasis. 12. Edematous changes at the level of the pancreas which could be part of the generalized mesenteric congestion noted present or due to coexisting pancreatitis. 13. Likely cystitis. 14. Fluid in the colon without wall thickening or inflammation. 15. Gastrostomy tube in place. 16. Sizable sacral decubitus ulcer right of the midline with underlying sacrococcygeal destruction beginning at about S4 eccentric to the right. Aortic Atherosclerosis (ICD10-I70.0). Electronically Signed   By: Francis Quam M.D.   On: 12/27/2023 21:31   CT Head Wo Contrast Result Date: 12/27/2023 CLINICAL DATA:  Mental status change, unknown cause EXAM: CT HEAD WITHOUT CONTRAST TECHNIQUE: Contiguous axial images were obtained from the base of the skull through the vertex without intravenous contrast. RADIATION DOSE REDUCTION: This exam was performed according to the departmental dose-optimization program which includes automated exposure control, adjustment of the mA and/or kV according to patient size and/or use of iterative reconstruction technique. COMPARISON:  None Available. FINDINGS: Motion limited. Brain: No evidence of acute infarction, hemorrhage, hydrocephalus, extra-axial collection or mass lesion/mass effect. Vascular: No hyperdense vessel. Skull: Normal. Negative for fracture or focal lesion. Sinuses/Orbits: Clear sinuses.  No acute orbital findings. IMPRESSION: Motion limited study without evidence of acute abnormality. Electronically Signed   By: Gilmore RAMAN  Joshua M.D.   On:  12/27/2023 20:48   DG Chest Portable 1 View Result Date: 12/27/2023 CLINICAL DATA:  Left IJ central line insertion confirmation. EXAM: PORTABLE CHEST 1 VIEW COMPARISON:  Portable chest today at 6:01 p.m. FINDINGS: 7:28 p.m. there is a new left IJ central line terminating at the superior cavoatrial junction. The cardiac size is normal with again noted CABG change. Stable mediastinum with aortic atherosclerosis. No vascular congestion is seen. No substantial pleural effusion. Moderately elevated right hemidiaphragm. The lungs are hypoexpanded but generally clear with limited view of the right base. No pneumothorax. Electrical pads overlie the lower chest. Multiple overlying telemetry leads. IMPRESSION: 1. New left IJ central line terminating at the superior cavoatrial junction. No pneumothorax. 2. Hypoinflated but generally clear lungs. 3. Aortic atherosclerosis. Electronically Signed   By: Francis Quam M.D.   On: 12/27/2023 20:10   DG Chest Port 1 View Result Date: 12/27/2023 CLINICAL DATA:  Question of sepsis to evaluate for abnormality. Altered mental status. Hypotensive. EXAM: PORTABLE CHEST 1 VIEW COMPARISON:  03/24/2023 FINDINGS: Postoperative changes in the mediastinum. Shallow inspiration. Heart size and pulmonary vascularity are normal. Right lung base opacity may represent atelectasis or pneumonia. This is new since prior study. No pleural effusion or pneumothorax. Mediastinal contours appear intact. IMPRESSION: Infiltration or atelectasis in the right lung base. Electronically Signed   By: Elsie Gravely M.D.   On: 12/27/2023 18:23    Labs   CBC: Recent Labs  Lab 12/21/23 0601 12/27/23 1759  WBC 5.0 21.8*  NEUTROABS  --  18.9*  HGB 9.3* 8.5*  HCT 29.4* 27.5*  MCV 90.2 92.9  PLT 294 478*   Basic Metabolic Panel: Recent Labs  Lab 12/21/23 0601 12/21/23 1552 12/22/23 0531 12/23/23 0418 12/27/23 1759  NA 139  --  137 135 129*  K 2.4* 3.8 3.4* 3.0* 3.3*  CL 106  --  108 104 100   CO2 23  --  22 25 12*  GLUCOSE 49*  --  47* 73 204*  BUN 5*  --  <5* <5* 8  CREATININE <0.30*  --  <0.30* <0.30* 0.77  CALCIUM  8.6*  --  8.1* 8.5* 8.5*  MG 1.7  --  1.9 1.9 1.8  PHOS 1.4*  --  2.9  --  2.1*   GFR: Estimated Creatinine Clearance: 70.1 mL/min (by C-G formula based on SCr of 0.77 mg/dL). Recent Labs  Lab 12/21/23 0601 12/27/23 1759 12/27/23 1918 12/27/23 2145  PROCALCITON  --   --   --  0.68  WBC 5.0 21.8*  --   --   LATICACIDVEN  --  2.1* 2.7*  --    Liver Function Tests: Recent Labs  Lab 12/21/23 0601 12/22/23 0531 12/27/23 1759  AST 26  --  255*  ALT 11  --  113*  ALKPHOS 124  --  228*  BILITOT 0.7  --  1.6*  PROT 4.3*  --  4.2*  ALBUMIN <1.5* <1.5* <1.5*   Recent Labs  Lab 12/27/23 1759  LIPASE 16   No results for input(s): AMMONIA in the last 168 hours.  ABG    Component Value Date/Time   PHART 7.26 (L) 12/27/2023 2231   PCO2ART 26 (L) 12/27/2023 2231   PO2ART 266 (H) 12/27/2023 2231   HCO3 11.7 (L) 12/27/2023 2231   ACIDBASEDEF 13.7 (H) 12/27/2023 2231   O2SAT 99.6 2024/01/18 0110   Coagulation Profile: Recent Labs  Lab 12/27/23 1759  INR 7.3*    Cardiac Enzymes:  No results for input(s): CKTOTAL, CKMB, CKMBINDEX, TROPONINI in the last 168 hours.  HbA1C: Hgb A1c MFr Bld  Date/Time Value Ref Range Status  12/27/2023 09:45 PM 5.8 (H) 4.8 - 5.6 % Final    Comment:    (NOTE) Diagnosis of Diabetes The following HbA1c ranges recommended by the American Diabetes Association (ADA) may be used as an aid in the diagnosis of diabetes mellitus.  Hemoglobin             Suggested A1C NGSP%              Diagnosis  <5.7                   Non Diabetic  5.7-6.4                Pre-Diabetic  >6.4                   Diabetic  <7.0                   Glycemic control for                       adults with diabetes.    03/19/2023 04:37 AM 7.6 (H) 4.8 - 5.6 % Final    Comment:    (NOTE) Pre diabetes:           5.7%-6.4%  Diabetes:              >6.4%  Glycemic control for   <7.0% adults with diabetes    CBG: Recent Labs  Lab Jan 01, 2024 0021  GLUCAP 377*    Review of Systems:   Unable to be obtained secondary to the patient's intubated and sedated status.   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   Surgical History    Past Surgical History:  Procedure Laterality Date   CESAREAN SECTION     CORONARY ARTERY BYPASS GRAFT  01/13/2023   IR REMOVAL TUN CV CATH W/O FL  03/20/2023   TUBAL LIGATION     Social History   reports that she quit smoking about 20 years ago. Her smoking use included cigarettes. She started smoking about 45 years ago. She has a 25 pack-year smoking history. She has never used smokeless tobacco. She reports current alcohol  use. She reports that she does not use drugs.   Family History   Her family history includes Diabetes in her maternal grandmother, mother, and sister; Hypertension in her sister; Thyroid  disease in her sister.   Allergies Not on File   Home Medications  Prior to Admission medications   Medication Sig Start Date End Date Taking? Authorizing Provider  amoxicillin -clavulanate (AUGMENTIN ) 875-125 MG tablet Take 1 tablet by mouth 2 (two) times daily. For 10 days. Started 12/25/23   Yes [provider]  apixaban  (ELIQUIS ) 5 MG TABS tablet Take 5 mg by mouth 2 (two) times  daily.   Yes [provider]  atorvastatin  (LIPITOR) 80 MG tablet Take 80 mg by mouth  at bedtime. Give 1 tablet via g-tube at bedtime   Yes [provider]  azithromycin (ZITHROMAX) 500 MG tablet Take 500 mg by mouth daily. For 5 days   Yes [provider]  bisacodyl  (DULCOLAX) 5 MG EC tablet Take 5 mg by mouth daily as needed for moderate constipation.   Yes [provider]  ciprofloxacin  (CIPRO ) 500 MG tablet Place 1 tablet (500 mg total) into feeding tube 2 (two) times daily for 8 doses. 12/23/23 12/27/23 Yes Jens Durand, MD  famotidine  (PEPCID ) 40 MG tablet Take 40 mg by mouth at bedtime.   Yes [provider]  gabapentin  (NEURONTIN ) 300 MG capsule Take 3 capsules (900 mg total) by mouth 3 (three) times daily. Patient taking differently: Place 900 mg into feeding tube 3 (three) times daily. 01/02/18  Yes Weber, Sarah L, PA-C  HYDROcodone -acetaminophen  (NORCO/VICODIN) 5-325 MG tablet Take 2 tablets by mouth every 4 (four) hours as needed for moderate pain (pain score 4-6).   Yes [provider]  levothyroxine  (SYNTHROID ) 50 MCG tablet Take 50 mcg by mouth daily.   Yes [provider]  methenamine (MANDELAMINE) 1 g tablet Take 1,000 mg by mouth 2 (two) times daily.   Yes [provider]  midodrine  (PROAMATINE ) 10 MG tablet Take 1 tablet (10 mg total) by mouth 3 (three) times daily. 12/23/23  Yes Jens Durand, MD  ondansetron  (ZOFRAN -ODT) 4 MG disintegrating tablet Take 4 mg by mouth every 8 (eight) hours as needed for nausea or vomiting.   Yes [provider]  polyethylene glycol (MIRALAX  / GLYCOLAX ) 17 g packet Take 17 g by mouth daily. Patient taking differently: Take 17 g by mouth daily. Via g-tube. One time a day, mix with 6-8 ounces of liquid 12/11/23  Yes Laurita Pillion, MD  potassium chloride  (KLOR-CON ) 20 MEQ packet Place 20 mEq into feeding tube daily for 3 days. 12/24/23 12/27/23 Yes Jens Durand, MD   promethazine (PHENERGAN) 25 MG tablet Take 25 mg by mouth every 6 (six) hours as needed for nausea or vomiting.   Yes [provider]  senna-docusate (SENOKOT-S) 8.6-50 MG tablet Take 1 tablet by mouth at bedtime.   Yes [provider]  sertraline  (ZOLOFT ) 50 MG tablet Take 1 tablet (50 mg total) by mouth daily. 12/24/23  Yes Jens Durand, MD  zinc  sulfate, 50mg  elemental zinc , 220 (50 Zn) MG capsule Take 1 capsule (220 mg total) by mouth daily. 12/24/23  Yes Jens Durand, MD  Scheduled Meds:  Chlorhexidine  Gluconate Cloth  6 each Topical Daily   insulin  aspart  0-9 Units Subcutaneous Q4H   ipratropium-albuterol   3 mL Nebulization Q6H   mouth rinse  15 mL Mouth Rinse Q2H   Continuous Infusions:  sodium chloride  20 mL/hr at 01-27-24 0029   amiodarone  Stopped (2024/01/27 0029)   amiodarone      ceFEPime  (MAXIPIME ) IV     ciprofloxacin  Stopped (12/27/23 2355)   epinephrine  18 mcg/min (01-27-2024 0029)   heparin  1,100 Units/hr (01-27-24 0029)   methylene blue  (antidote) 250 mg in dextrose  5 % 250 mL (1 mg/mL) infusion 0.5 mg/kg/hr (2024-01-27 0029)   norepinephrine  (LEVOPHED ) Adult infusion 40 mcg/min (27-Jan-2024 0029)   potassium chloride  10 mEq (January 27, 2024 0112)   vancomycin      vancomycin  150 mL/hr at 01/27/24 0029   vasopressin  0.04 Units/min (Jan 27, 2024 0029)   PRN Meds:.amiodarone , docusate sodium , ipratropium-albuterol , mouth rinse, polyethylene glycol  Active Hospital Problem list   See systems below  Assessment & Plan:  #Brief VT?VF?PEA Cardiac  Arrest : Lasted <1 minute with immediate ROSC achieved prior to Defib or ACLS drugs administration #Mixed Shock~CirculatorySepticVasoplegic Hx: Atrial fibrillation on apixaban  HTN HLD, CAD s/p CABG (01/13/2023) PEA arrest 02/10/23,  -1st VF/VT episode in ED requiring 1 shock and Amiodarone  boluses -trend Lactic acid  -obtain 2D Echo in am  -concern VF/VT may exacerbate RV failure  -Check coox -Continue NE, Vaso and Epi  for MAP goals -Hold apixaban , will start IV heparin   -Continue amiodarone  gtt -Atorvastatin  80mg  PO at bedtime once LFTs normalizes -Cardiology consult  #Acute on Chronic Hypoxic Respiratory Failure #COVID Infection #Acute Small Left Lower Lobe PE #Aspiration Pneumonia -con't full mechanical support 6-8cc/kg/Vt  -titrate FiO2, PEEP to maintain O2 sat >90%  -Lung protective ventilation  -PRN Chest X-ray & ABG -PRN and scheduled bronchodilators -Start systemic steroid -SAT/SBT when appropriate  -prn fentanyl , prop for RASS -1   #Acute Left Lower Lobe PE without CT evidence of right heart strain -Hemodynamic support with IVF + vasopressors  -Start Heparin  gtt -Follow Troponin, serial EKG  -Obtain TTEcho  -Obtain Bilateral US  DVT -Consult IR/Vascular if appropriate    #Severe Sepsis with Septic Shock due to Multiple Sources #Covid Pneumonia #Sacral Ulcer with Osteomylitis #UTI Hx;chronic sacral osteomyelitis (cx grew aceinebacter baumaniii), recurrent UTIs, and sternal wound(Cx grew MSSA, corynebacterium and Pseudomonas) .  Initial interventions/workup included: 2 L of NS/LR & Cefepime  -F/u cultures, trend lactic/ PCT -Monitor WBC/ fever curve -Obtain MRSA nasal swab, RVP, legionella, strep urine Ag -IV antibiotics cefepime , vancomycin  and ciprofloxacin  -IVF hydration as needed -Pressors PRN for MAP goal >65 -Strict I/O's   #Transaminitis Likely shock liver -Acetaminophen  level and tox screen -Check hepatitis panel, ANA, ammonia, amylase, lipase -Avoid hepatotoxic -Daily LFTs   #AKI (baseline Cr <0.3 bumped to 0.77) on CKD #AGMA with Lactic Acidosis  Likely pre-renal azotemia iso shock and cardiac arrest -trend bmp, mag, phos -replete electrolytes -strict I&O -given multiple amps of sodium bicarb, start gtt -Avoid nephrotoxic agents,   #Type II diabetes mellitus  -Check A1c -CBG's q4hrs  -SSI -Target CBG readings 140 to 180 -Follow hypo/hyperglycemic protocol    #Acquired hypothyroidism -Check TSH and free T4  #Altered Mental Status -Follow labs TSH, UDS, cortisol, ammonia, thiamine/B12 -Follow Infectious Workup: UA/UCx, BCx, consider LP -CTH neg -EEG if c/f underlying seizure -Avoid sedatives  Best practice:  Diet:  Tube Feed  G-Tube inplace Pain/Anxiety/Delirium protocol (if indicated): Yes (RASS goal -1) VAP protocol (if indicated): Yes DVT prophylaxis: Systemic AC GI prophylaxis: PPI Glucose control:  SSI Yes Central venous access:  Yes, and it is still needed Arterial line:  Yes, and it is still needed Foley:  Yes, and it is still needed Mobility:  bed rest  PT consulted: N/A Last date of multidisciplinary goals of care discussion []  Code Status:  full code Disposition: FULL   = Goals of Care = Updated patient's husband at the bedside and over the phone.  At this time he would like to continue with full scope of care despite poor prognosis.  Will consult palliative care for goals of care discussion.  Primary Emergency Contact: Gapski,James  Critical care time: 45 minutes        Almarie Nose DNP, CCRN, FNP-C, AGACNP-BC Acute Care & Family Nurse Practitioner [facility] on call pager [phone]

## 2023-12-27 NOTE — ED Triage Notes (Addendum)
 Pt to er via ems, per ems pt is here for altered mental status, states that she was found hypotensive, pt has humeral io and is getting levo at 10mcg/min and some saline aprox 100ml infused and 20mg  of lidocaine    Pt arouses to voice. Pt on 4L via [place]

## 2023-12-27 NOTE — ED Notes (Addendum)
 Per pharm, it is ok for abx to go into io, abx infusing via IO, md at bedside for central line.

## 2023-12-27 NOTE — ED Notes (Signed)
 X-ray at bedside.

## 2023-12-27 NOTE — Progress Notes (Signed)
 PHARMACY - ANTICOAGULATION CONSULT NOTE  Pharmacy Consult for heparin  infusion Indication: pulmonary embolus  Not on File  Patient Measurements: Height: 5' (152.4 cm) Weight: 80.3 kg (177 lb) IBW/kg (Calculated) : 45.5 HEPARIN  DW (KG): 63.9  Vital Signs: Temp: 95 F (35 C) (08/23 1923) Temp Source: Axillary (08/23 1923) BP: 72/43 (08/23 2120) Pulse Rate: 105 (08/23 1936)  Labs: Recent Labs    12/27/23 1759  HGB 8.5*  HCT 27.5*  PLT 478*  CREATININE 0.77    Estimated Creatinine Clearance: 70.1 mL/min (by C-G formula based on SCr of 0.77 mg/dL).   Medical History: Past Medical History:  Diagnosis Date   Anxiety    Arthritis    Bedbound    CAD, multiple vessel    s/p CABG 01/13/23   Depression    DM type 2 with diabetic peripheral neuropathy (HCC)    DM2 (diabetes mellitus, type 2) (HCC)    DVT (deep venous thrombosis) (HCC)    Gastrostomy tube dependent (HCC)    History of iron deficiency anemia    HLD (hyperlipidemia)    HTN (hypertension)    Hypothyroidism    Memory deficit after cerebral infarction    Migraines    Morbid obesity (HCC)    Pulmonary embolism (HCC)    Sacral decubitus ulcer    Sacral osteomyelitis (HCC)    Stroke (HCC)    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)     Medications:  Scheduled:   Chlorhexidine  Gluconate Cloth  6 each Topical Daily   insulin  aspart  0-9 Units Subcutaneous Q4H   ipratropium-albuterol   3 mL Nebulization Q6H   ketamine  (KETALAR ) injection 10mg /mL (IV use)  1.5 mg/kg Intravenous Once   rocuronium   80 mg Intravenous Once   Infusions:   sodium chloride  250 mL (12/27/23 1932)   amiodarone  Stopped (12/27/23 1943)   [START ON 2024/01/05] amiodarone      amiodarone      [START ON 2024/01/05] ceFEPime  (MAXIPIME ) IV     ciprofloxacin      epinephrine  0.5 mcg/min (12/27/23 1936)   magnesium  sulfate bolus IVPB     norepinephrine  (LEVOPHED ) Adult infusion 25 mcg/min (12/27/23 2055)    potassium chloride      sodium chloride      [START ON 01-05-2024] vancomycin      [START ON Jan 05, 2024] vancomycin      vasopressin  0.03 Units/min (12/27/23 1914)   PRN: amiodarone , docusate sodium , ipratropium-albuterol , polyethylene glycol  Assessment: 63 y.o. female w/ PMH of PMH including T2DM, hypothyroidism, sacral osteomyelitis, CAD status post CABG, previous DVT/PE, chronic opiate use, and history of sacral decubitus with osteomyelitis admitted on 12/27/2023 with presenting with severe sepsis and hypotension requiring vasopressor support. CT angio reveals PE. She is noted to be on apixaban  PTA w/ last dose this morning  Baseline Labs: pending  Goal of Therapy:  Heparin  level 0.3-0.7 units/ml aPTT 66 - 102 seconds Monitor platelets by anticoagulation protocol: Yes   Plan:  Give 4000 units bolus x 1 Start heparin  infusion at 1100 units/hr Check aPTT level in 6 hours (we will use aPTT to guide therapy until aPTT and anti-Xa levels correlate) Continue to monitor H&H and platelets  Adriana JONETTA Bolster 12/27/2023,9:53 PM

## 2023-12-27 NOTE — ED Provider Notes (Signed)
 [facility] Provider Note    None    (approximate)   History   Chief Complaint: Altered Mental Status   HPI  Ruth Mayo is a 63 y.o. female with a history of of diabetes hypertension pulmonary embolism stroke, recently hospitalized with UTI and sepsis with urine culture showing Pseudomonas aeruginosa's, discharged on ciprofloxacin  who was brought back to the ED today due to confusion and depressed mental status worsening for last 3 days.  Today was found pale and hypotensive.  EMS placed IO in the left humerus, gave 500 mL IV fluids prior to arrival.  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   Depression    DM type 2 with diabetic peripheral neuropathy (HCC)    DM2 (diabetes mellitus, type 2) (HCC)    DVT (deep venous thrombosis) (HCC)    Gastrostomy tube dependent (HCC)    History of iron deficiency anemia    HLD (hyperlipidemia)    HTN (hypertension)    Hypothyroidism    Memory deficit after cerebral infarction    Migraines    Morbid obesity (HCC)    Pulmonary embolism (HCC)    Sacral decubitus ulcer    Sacral osteomyelitis (HCC)    Stroke (HCC)    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 [facility] [place])     Current Outpatient Rx   Order #: [id number] Class: Historical Med   Order #: [id number] Class: Historical Med   Order #: [id number] Class: Historical Med   Order #: [id number] Class: No Print   Order #: [id number] Class: Historical Med   Order #: [id number] Class: Normal   Order #: [id number] Class: Historical Med   Order #: [id number] Class: Normal   Order #: [id number] Class: No Print   Order #: [id number] Class: Historical Med   Order #: [id number] Class: No Print   Order #: [id number] Class: No Print   Order #: [id number] Class: Historical Med   Order #:  [id number] Class: No Print   Order #: [id number] Class: No Print    Past Surgical History:  Procedure Laterality Date   CESAREAN SECTION     CORONARY ARTERY BYPASS GRAFT  01/13/2023   IR REMOVAL TUN CV CATH W/O FL  03/20/2023   TUBAL LIGATION      Physical Exam   Triage Vital Signs: ED Triage Vitals [12/27/23 1737]  Encounter Vitals Group     BP      Girls Systolic BP Percentile      Girls Diastolic BP Percentile      Boys Systolic BP Percentile      Boys Diastolic BP Percentile      Pulse      Resp      Temp      Temp src      SpO2      Weight 177 lb (80.3 kg)     Height 5' (1.524 m)     Head Circumference      Peak Flow      Pain Score      Pain Loc      Pain Education      Exclude from Growth Chart     Most recent vital signs: Vitals:   12/27/23 1936 12/27/23 1946  BP: (!) 62/29 (!) 80/58  Pulse: ROLLEN)  105   Resp:  20  Temp:    SpO2:      General: Awake, ill-appearing.  Following commands.  GCS 13 CV:  Cool, pale extremities, thready DP pulses.   Resp:  Normal effort.  Clear lungs. Abd:  No distention.  Soft nontender Other:  Pale conjunctiva   ED Results / Procedures / Treatments   Labs (all labs ordered are listed, but only abnormal results are displayed) Labs Reviewed  RESP PANEL BY RT-PCR (RSV, FLU A&B, COVID)  RVPGX2 - Abnormal; Notable for the following components:      Result Value   SARS Coronavirus 2 by RT PCR POSITIVE (*)    All other components within normal limits  LACTIC ACID, PLASMA - Abnormal; Notable for the following components:   Lactic Acid, Venous 2.1 (*)    All other components within normal limits  COMPREHENSIVE METABOLIC PANEL WITH GFR - Abnormal; Notable for the following components:   Sodium 129 (*)    Potassium 3.3 (*)    CO2 12 (*)    Glucose, Bld 204 (*)    Calcium  8.5 (*)    Total Protein 4.2 (*)    Albumin <1.5 (*)    AST 255 (*)    ALT 113 (*)    Alkaline Phosphatase 228 (*)    Total Bilirubin 1.6 (*)    Anion  gap 17 (*)    All other components within normal limits  CBC WITH DIFFERENTIAL/PLATELET - Abnormal; Notable for the following components:   WBC 21.8 (*)    RBC 2.96 (*)    Hemoglobin 8.5 (*)    HCT 27.5 (*)    RDW 15.6 (*)    Platelets 478 (*)    Neutro Abs 18.9 (*)    Abs Immature Granulocytes 0.75 (*)    All other components within normal limits  BLOOD GAS, VENOUS - Abnormal; Notable for the following components:   pCO2, Ven 32 (*)    pO2, Ven 46 (*)    Bicarbonate 14.4 (*)    Acid-base deficit 11.5 (*)    All other components within normal limits  URINALYSIS, W/ REFLEX TO CULTURE (INFECTION SUSPECTED) - Abnormal; Notable for the following components:   Color, Urine AMBER (*)    APPearance CLOUDY (*)    Hgb urine dipstick LARGE (*)    Ketones, ur 5 (*)    Protein, ur 100 (*)    Leukocytes,Ua MODERATE (*)    Bacteria, UA FEW (*)    All other components within normal limits  PHOSPHORUS - Abnormal; Notable for the following components:   Phosphorus 2.1 (*)    All other components within normal limits  CULTURE, BLOOD (ROUTINE X 2)  CULTURE, BLOOD (ROUTINE X 2)  URINE CULTURE  MRSA NEXT GEN BY PCR, NASAL  RESPIRATORY PANEL BY PCR  LIPASE, BLOOD  MAGNESIUM   LACTIC ACID, PLASMA  PROTIME-INR  LEGIONELLA PNEUMOPHILA SEROGP 1 UR AG  STREP PNEUMONIAE URINARY ANTIGEN  PHOSPHORUS  BLOOD GAS, ARTERIAL  MAGNESIUM   BASIC METABOLIC PANEL WITH GFR  CBC  HEMOGLOBIN A1C  PROCALCITONIN  TSH  T4, FREE     EKG Interpreted by me Sinus rhythm rate of 89.  Normal axis, left bundle branch block.  No acute ischemic changes.  Repeat EKG obtained after episode of ventricular tachycardia shows atrial fibrillation, right axis, left bundle branch block.   RADIOLOGY Chest x-ray interpreted by me, no consolidation or pneumothorax.  Radiology report reviewed  Chest x-ray after left IJ CVC  placement shows satisfactory position of the central venous catheter   PROCEDURES:  .Critical  Care  Performed by: Viviann Pastor, MD Authorized by: Viviann Pastor, MD   Critical care provider statement:    Critical care time (minutes):  60   Critical care time was exclusive of:  Separately billable procedures and treating other patients   Critical care was necessary to treat or prevent imminent or life-threatening deterioration of the following conditions:  Sepsis and shock   Critical care was time spent personally by me on the following activities:  Development of treatment plan with patient or surrogate, discussions with consultants, evaluation of patient's response to treatment, examination of patient, obtaining history from patient or surrogate, ordering and performing treatments and interventions, ordering and review of laboratory studies, ordering and review of radiographic studies, pulse oximetry, re-evaluation of patient's condition and review of old charts   Care discussed with: admitting provider   Comments:        .Cardioversion  Date/Time: 12/27/2023 8:08 PM  Performed by: Viviann Pastor, MD Authorized by: Viviann Pastor, MD   Consent:    Consent obtained:  Emergent situation Pre-procedure details:    Cardioversion basis:  Emergent   Rhythm:  Ventricular tachycardia   Electrode placement:  Anterior-posterior Attempt one:    Cardioversion mode:  Synchronous   Waveform:  Biphasic   Shock (Joules):  200   Shock outcome:  Conversion to other rhythm Post-procedure details:    Patient status:  Awake   Patient tolerance of procedure:  Tolerated well, no immediate complications Comments:       Converted to afib RVR.    Central Line  Date/Time: 12/27/2023 8:09 PM  Performed by: Viviann Pastor, MD Authorized by: Viviann Pastor, MD   Consent:    Consent obtained:  Verbal and emergent situation   Consent given by:  Spouse   Risks discussed:  Arterial puncture, infection, bleeding, pneumothorax and nerve damage   Alternatives discussed:   Alternative treatment and no treatment Universal protocol:    Immediately prior to procedure, a time out was called: yes     Patient identity confirmed:  Arm band Pre-procedure details:    Indication(s): central venous access and insufficient peripheral access     Hand hygiene: Hand hygiene performed prior to insertion     Sterile barrier technique: All elements of maximal sterile technique followed     Skin preparation:  Chlorhexidine    Skin preparation agent: Skin preparation agent completely dried prior to procedure   Sedation:    Sedation type:  None Anesthesia:    Anesthesia method:  Local infiltration   Local anesthetic:  Lidocaine  1% w/o epi Procedure details:    Location:  L internal jugular   Patient position:  Trendelenburg   Procedural supplies:  Triple lumen   Catheter size:  7 Fr   Landmarks identified: yes     Ultrasound guidance: yes     Ultrasound guidance timing: real time     Sterile ultrasound techniques: Sterile gel and sterile probe covers were used     Number of attempts:  2   Successful placement: yes   Post-procedure details:    Post-procedure:  Dressing applied   Assessment:  Blood return through all ports, free fluid flow, placement verified by x-ray and no pneumothorax on x-ray   Procedure completion:  Tolerated    MEDICATIONS ORDERED IN ED: Medications  0.9 %  sodium chloride  infusion (250 mLs Intravenous New Bag/Given 12/27/23 1932)  norepinephrine  (LEVOPHED ) 4mg  in  (0.016 mg/mL) premix infusion (25 mcg/min Intravenous Rate/Dose Change 12/27/23 1929)  amiodarone  (NEXTERONE  PREMIX) 360-4.14 MG/200ML-% (1.8 mg/mL) IV infusion (0 mg/hr Intravenous Stopped 12/27/23 1943)  amiodarone  (NEXTERONE  PREMIX) 360-4.14 MG/200ML-% (1.8 mg/mL) IV infusion (has no administration in time range)  amiodarone  (NEXTERONE ) 1.5 mg/mL IV bolus only (  Not Given 12/27/23 1850)  vasopressin  (PITRESSIN) 20 Units in 100 mL (0.2 unit/mL) infusion-*FOR SHOCK* (0.03 Units/min  Intravenous New Bag/Given 12/27/23 1914)  EPINEPHrine  (ADRENALIN ) 5 mg in NS 250 mL (0.02 mg/mL) premix infusion (0.5 mcg/min Intravenous New Bag/Given 12/27/23 1936)  iohexol  (OMNIPAQUE ) 350 MG/ML injection 100 mL (has no administration in time range)  Chlorhexidine  Gluconate Cloth 2 % PADS 6 each (has no administration in time range)  docusate sodium  (COLACE) capsule 100 mg (has no administration in time range)  polyethylene glycol (MIRALAX  / GLYCOLAX ) packet 17 g (has no administration in time range)  ipratropium-albuterol  (DUONEB) 0.5-2.5 (3) MG/3ML nebulizer solution 3 mL (has no administration in time range)  ipratropium-albuterol  (DUONEB) 0.5-2.5 (3) MG/3ML nebulizer solution 3 mL (has no administration in time range)  insulin  aspart (novoLOG ) injection 0-9 Units (has no administration in time range)  hydrocortisone  sodium succinate  (SOLU-CORTEF ) 100 MG injection 100 mg (has no administration in time range)  sodium chloride  0.9 % bolus 2,000 mL (0 mLs Intravenous Stopped 12/27/23 1931)  ceFEPIme  (MAXIPIME ) 2 g in sodium chloride  0.9 % 100 mL IVPB (0 g Intravenous Stopped 12/27/23 1857)  amiodarone  (NEXTERONE ) 1.8 mg/mL load via infusion 150 mg (150 mg Intravenous Bolus from Bag 12/27/23 1849)     IMPRESSION / MDM / ASSESSMENT AND PLAN / ED COURSE  I reviewed the triage vital signs and the nursing notes.  DDx: Septic shock, hemorrhagic shock, PE with cardiogenic shock, acute renal failure, electrolyte derangement, COVID, influenza, UTI, pneumonia, bowel obstruction, bowel perforation, intra-abdominal abscess, intracranial hemorrhage  Patient's presentation is most consistent with acute presentation with potential threat to life or bodily function.  Patient presents in extremis with hypotension, encephalopathy, pale appearing.  Clearly in hemodynamic shock.  IV fluids and Levophed  were continued.  Patient remained persistently hypotensive despite escalating Levophed , so central venous  catheter was placed, vasopressin  started.  Patient received cefepime .  During central line placement patient had an episode of ventricular tachycardia.  She had electrocardioversion 1 time which converted her to atrial fibrillation with RVR.  Amiodarone  bolus and infusion was started.  Case discussed with ICU team for further management.       FINAL CLINICAL IMPRESSION(S) / ED DIAGNOSES   Final diagnoses:  Refractory shock (HCC)  COVID-19  Acute metabolic encephalopathy     Rx / DC Orders   ED Discharge Orders     None        Note:  This document was prepared using Dragon voice recognition software and may include unintentional dictation errors.   Viviann Pastor, MD 12/27/23 2011

## 2023-12-27 NOTE — Procedures (Signed)
 INTUBATION PROCEDURE NOTE  Ruth Mayo  [id number]  Jan 07, 1961  Date:12/27/23  Time:10:22 PM   Provider Performing:Vika Buske A Eliana Lueth   Procedure: Intubation (31500)  Indication(s) Respiratory Failure  Consent Unable to obtain consent due to emergent nature of procedure.  Anesthesia Rocuronium  and Ketamine   Time Out Verified patient identification, verified procedure, site/side was marked, verified correct patient position, special equipment/implants available, medications/allergies/relevant history reviewed, required imaging and test results available.  Sterile Technique Usual hand hygeine, masks, and gloves were used  Procedure Description Patient positioned in bed supine.  Sedation given as noted above.  Patient was intubated with endotracheal tube using Glidescope.  View was Grade 1 full glottis .  Number of attempts was 1.  Colorimetric CO2 detector was consistent with tracheal placement.  Complications/Tolerance None; patient tolerated the procedure well. Chest X-ray is ordered to verify placement.  EBL Minimal  Specimen(s) None   Almarie Nose, DNP, CCRN, FNP-C, AGACNP-BC Acute Care & Family Nurse Practitioner  [facility]  See Amion for personal pager PCCM on call pager [phone] until 7 am

## 2023-12-27 NOTE — ED Notes (Signed)
 Pt in vtach, md at bedside , sync cardioversion at 200 j

## 2023-12-28 ENCOUNTER — Inpatient Hospital Stay (HOSPITAL_COMMUNITY)
Admit: 2023-12-28 | Discharge: 2023-12-28 | Disposition: A | Discharge status: Home / Self Care | Attending: Pulmonary Disease

## 2023-12-28 DIAGNOSIS — U071 COVID-19: Secondary | ICD-10-CM

## 2023-12-28 DIAGNOSIS — R579 Shock, unspecified: Secondary | ICD-10-CM

## 2023-12-28 DIAGNOSIS — I2699 Other pulmonary embolism without acute cor pulmonale: Secondary | ICD-10-CM

## 2023-12-28 DIAGNOSIS — I2609 Other pulmonary embolism with acute cor pulmonale: Secondary | ICD-10-CM

## 2023-12-28 DIAGNOSIS — J9621 Acute and chronic respiratory failure with hypoxia: Secondary | ICD-10-CM | POA: Diagnosis not present

## 2023-12-28 DIAGNOSIS — I469 Cardiac arrest, cause unspecified: Secondary | ICD-10-CM | POA: Diagnosis not present

## 2023-12-28 DIAGNOSIS — E119 Type 2 diabetes mellitus without complications: Secondary | ICD-10-CM

## 2023-12-28 DIAGNOSIS — A419 Sepsis, unspecified organism: Secondary | ICD-10-CM

## 2023-12-28 DIAGNOSIS — I3139 Other pericardial effusion (noninflammatory): Secondary | ICD-10-CM | POA: Diagnosis not present

## 2023-12-28 DIAGNOSIS — L89159 Pressure ulcer of sacral region, unspecified stage: Secondary | ICD-10-CM

## 2023-12-28 DIAGNOSIS — R7401 Elevation of levels of liver transaminase levels: Secondary | ICD-10-CM

## 2023-12-28 DIAGNOSIS — N179 Acute kidney failure, unspecified: Secondary | ICD-10-CM

## 2023-12-28 DIAGNOSIS — J1282 Pneumonia due to coronavirus disease 2019: Secondary | ICD-10-CM

## 2023-12-28 DIAGNOSIS — R6521 Severe sepsis with septic shock: Secondary | ICD-10-CM

## 2023-12-28 LAB — PROTIME-INR
INR: >10 (ref 0.8–1.2)
Prothrombin Time: >90 s — ABNORMAL HIGH (ref 11.4–15.2)

## 2023-12-28 LAB — BLOOD GAS, ARTERIAL
Acid-base deficit: 22.6 mmol/L — ABNORMAL HIGH (ref 0.0–2.0)
Acid-base deficit: 20.2 mmol/L — ABNORMAL HIGH (ref 0.0–2.0)
Bicarbonate: 7.2 mmol/L — ABNORMAL LOW (ref 20.0–28.0)
Bicarbonate: 9.1 mmol/L — ABNORMAL LOW (ref 20.0–28.0)
FIO2: 80 %
FIO2: 80 %
MECHVT: 440 mL
MECHVT: 440 mL
Mechanical Rate: 28
O2 Saturation: 99.6 %
O2 Saturation: 98.8 %
PEEP: 6 cmH2O
PEEP: 6 cmH2O
Patient temperature: 36.1
Patient temperature: 37
RATE: 28 {breaths}/min
pCO2 arterial: 27 mmHg — ABNORMAL LOW (ref 32–48)
pCO2 arterial: 32 mmHg (ref 32–48)
pH, Arterial: 7.03 — CL (ref 7.35–7.45)
pH, Arterial: 7.06 — CL (ref 7.35–7.45)
pO2, Arterial: 150 mmHg — ABNORMAL HIGH (ref 83–108)
pO2, Arterial: 96 mmHg (ref 83–108)

## 2023-12-28 LAB — RESPIRATORY PANEL BY PCR

## 2023-12-28 LAB — APTT
aPTT: >200 s (ref 24–36)
aPTT: >200 s (ref 24–36)

## 2023-12-28 LAB — CBC
HCT: 29.8 % — ABNORMAL LOW (ref 36.0–46.0)
Hemoglobin: 9.2 g/dL — ABNORMAL LOW (ref 12.0–15.0)
MCH: 29.2 pg (ref 26.0–34.0)
MCHC: 30.9 g/dL (ref 30.0–36.0)
MCV: 94.6 fL (ref 80.0–100.0)
Platelets: 526 K/uL — ABNORMAL HIGH (ref 150–400)
RBC: 3.15 MIL/uL — ABNORMAL LOW (ref 3.87–5.11)
RDW: 17 % — ABNORMAL HIGH (ref 11.5–15.5)
WBC: 49.7 K/uL — ABNORMAL HIGH (ref 4.0–10.5)
nRBC: 0.1 % (ref 0.0–0.2)

## 2023-12-28 LAB — COMPREHENSIVE METABOLIC PANEL WITH GFR
ALT: 419 U/L — ABNORMAL HIGH (ref 0–44)
AST: 1301 U/L — ABNORMAL HIGH (ref 15–41)
Albumin: <1.5 g/dL — ABNORMAL LOW (ref 3.5–5.0)
Alkaline Phosphatase: 296 U/L — ABNORMAL HIGH (ref 38–126)
Anion gap: 15 (ref 5–15)
BUN: 9 mg/dL (ref 6–20)
CO2: 8 mmol/L — ABNORMAL LOW (ref 22–32)
Calcium: 7.4 mg/dL — ABNORMAL LOW (ref 8.9–10.3)
Chloride: 102 mmol/L (ref 98–111)
Creatinine, Ser: 0.7 mg/dL (ref 0.44–1.00)
GFR, Estimated: >60 mL/min (ref >60)
Glucose, Bld: 297 mg/dL — ABNORMAL HIGH (ref 70–99)
Potassium: 4.2 mmol/L (ref 3.5–5.1)
Sodium: 125 mmol/L — ABNORMAL LOW (ref 135–145)
Total Bilirubin: 1.5 mg/dL — ABNORMAL HIGH (ref 0.0–1.2)
Total Protein: 3.7 g/dL — ABNORMAL LOW (ref 6.5–8.1)

## 2023-12-28 LAB — TROPONIN I (HIGH SENSITIVITY): Troponin I (High Sensitivity): 751 ng/L (ref <18)

## 2023-12-28 LAB — HEPARIN LEVEL (UNFRACTIONATED): Heparin Unfractionated: >1.1 [IU]/mL — ABNORMAL HIGH (ref 0.30–0.70)

## 2023-12-28 LAB — COOXEMETRY PANEL
Carboxyhemoglobin: <0.3 % — ABNORMAL LOW (ref 0.5–1.5)
Methemoglobin: <0.7 % (ref 0.0–1.5)
O2 Saturation: 99.6 %
Total hemoglobin: 8.9 g/dL — ABNORMAL LOW (ref 12.0–16.0)
Total oxygen content: 99.1 %

## 2023-12-28 LAB — ECHOCARDIOGRAM COMPLETE
Area-P 1/2: 7.9 cm2
Height: 60 in
S' Lateral: 2.8 cm
Weight: 2832.47 [oz_av]

## 2023-12-28 LAB — LACTIC ACID, PLASMA
Lactic Acid, Venous: 6.6 mmol/L (ref 0.5–1.9)
Lactic Acid, Venous: >9 mmol/L (ref 0.5–1.9)
Lactic Acid, Venous: >9 mmol/L (ref 0.5–1.9)

## 2023-12-28 LAB — MAGNESIUM: Magnesium: 2.4 mg/dL (ref 1.7–2.4)

## 2023-12-28 LAB — HEMOGLOBIN A1C
Hgb A1c MFr Bld: 5.8 % — ABNORMAL HIGH (ref 4.8–5.6)
Mean Plasma Glucose: 119.76 mg/dL

## 2023-12-28 LAB — FIBRINOGEN: Fibrinogen: 177 mg/dL — ABNORMAL LOW (ref 210–475)

## 2023-12-28 LAB — GLUCOSE, CAPILLARY
Glucose-Capillary: 112 mg/dL — ABNORMAL HIGH (ref 70–99)
Glucose-Capillary: 178 mg/dL — ABNORMAL HIGH (ref 70–99)
Glucose-Capillary: 284 mg/dL — ABNORMAL HIGH (ref 70–99)
Glucose-Capillary: 377 mg/dL — ABNORMAL HIGH (ref 70–99)

## 2023-12-28 LAB — STREP PNEUMONIAE URINARY ANTIGEN: Strep Pneumo Urinary Antigen: NEGATIVE

## 2023-12-28 LAB — PHOSPHORUS: Phosphorus: 1.6 mg/dL — ABNORMAL LOW (ref 2.5–4.6)

## 2023-12-28 MED ORDER — VANCOMYCIN HCL 1000 MG IV SOLR
1000.0000 mg | INTRAVENOUS | Status: DC
  Filled 2023-12-28: qty 20

## 2023-12-28 MED ORDER — STERILE WATER FOR INJECTION IV SOLN: INTRAVENOUS | Status: DC

## 2023-12-28 MED ORDER — POLYVINYL ALCOHOL 1.4 % OP SOLN: 1.0000 [drp] | Freq: Four times a day (QID) | OPHTHALMIC | Status: DC | PRN

## 2023-12-28 MED ORDER — SODIUM BICARBONATE 8.4 % IV SOLN
INTRAVENOUS | Status: AC
  Filled 2023-12-28: qty 50

## 2023-12-28 MED ORDER — FENTANYL CITRATE PF 50 MCG/ML IJ SOSY
25.0000 ug | PREFILLED_SYRINGE | INTRAMUSCULAR | Status: DC | PRN
  Administered 2023-12-28: 200 ug via INTRAVENOUS
  Filled 2023-12-28: qty 4

## 2023-12-28 MED ORDER — MIDAZOLAM HCL 2 MG/2ML IJ SOLN
2.0000 mg | INTRAMUSCULAR | Status: DC | PRN
  Administered 2023-12-28: 2 mg via INTRAVENOUS
  Filled 2023-12-28: qty 4

## 2023-12-28 MED ORDER — GLYCOPYRROLATE 0.2 MG/ML IJ SOLN: 0.2000 mg | INTRAMUSCULAR | Status: DC | PRN

## 2023-12-28 MED ORDER — SODIUM PHOSPHATES 45 MMOLE/15ML IV SOLN
45.0000 mmol | Freq: Once | INTRAVENOUS | Status: AC
  Administered 2023-12-28: 45 mmol via INTRAVENOUS
  Filled 2023-12-28: qty 15

## 2023-12-28 MED ORDER — STERILE WATER FOR INJECTION IV SOLN
INTRAVENOUS | Status: DC
  Filled 2023-12-28: qty 150

## 2023-12-28 MED ORDER — ORAL CARE MOUTH RINSE
15.0000 mL | OROMUCOSAL | Status: DC | PRN
Start: 2023-12-28 — End: 2023-12-28

## 2023-12-28 MED ORDER — ACETAMINOPHEN 650 MG RE SUPP: 650.0000 mg | Freq: Four times a day (QID) | RECTAL | Status: DC | PRN

## 2023-12-28 MED ORDER — SODIUM BICARBONATE 8.4 % IV SOLN
100.0000 meq | Freq: Once | INTRAVENOUS | Status: AC
  Administered 2023-12-28: 100 meq via INTRAVENOUS

## 2023-12-28 MED ORDER — ORAL CARE MOUTH RINSE
15.0000 mL | OROMUCOSAL | Status: DC
  Administered 2023-12-28 (×5): 15 mL via OROMUCOSAL

## 2023-12-28 MED ORDER — SODIUM CHLORIDE 0.9 % IV SOLN: INTRAVENOUS | Status: DC

## 2023-12-28 MED ORDER — ACETAMINOPHEN 325 MG PO TABS
650.0000 mg | ORAL_TABLET | Freq: Four times a day (QID) | ORAL | Status: DC | PRN
Start: 2023-12-28 — End: 2023-12-28

## 2023-12-28 MED ORDER — GLYCOPYRROLATE 1 MG PO TABS: 1.0000 mg | ORAL_TABLET | ORAL | Status: DC | PRN

## 2023-12-28 MED ORDER — STERILE WATER FOR INJECTION IV SOLN
INTRAVENOUS | Status: DC
  Filled 2023-12-28: qty 150
  Filled 2023-12-28 (×2): qty 1000

## 2023-12-29 LAB — GLUCOSE, CAPILLARY: Glucose-Capillary: 201 mg/dL — ABNORMAL HIGH (ref 70–99)

## 2023-12-29 LAB — URINE CULTURE: Culture: >=100000 — AB

## 2023-12-30 LAB — LEGIONELLA PNEUMOPHILA SEROGP 1 UR AG: L. pneumophila Serogp 1 Ur Ag: NEGATIVE

## 2024-01-02 LAB — CULTURE, BLOOD (ROUTINE X 2)
Culture: NO GROWTH
Culture: NO GROWTH

## 2024-01-05 NOTE — IPAL (Signed)
  Interdisciplinary Goals of Care Family Meeting   Date carried out: 01/05/2024  Location of the meeting: Conference room  Member's involved: Physician and Family Member or next of kin  Durable Power of Attorney or acting medical decision maker: Husband, Lynwood    Discussion: We discussed goals of care for [employer]. Discussed with family members that Ms. Diffee is having multi-organ failure with progressive shock and little to no chance of meaningful recovery. She is persistently acidemic, on multiple vasopressor agents, and with progressive shock.  Family understand that the patient is in the dying process. They would like to proceed with withdrawal of care allowing and transitioning care to comfort measures only. Orders placed, RN notified.  Code status:   Code Status: Do not attempt resuscitation (DNR) - Comfort care   Disposition: In-patient comfort care  Time spent for the meeting: 10 minutes    Belva November, MD  01-05-2024, 3:19 PM

## 2024-01-05 NOTE — H&P (Incomplete)
 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  On chart review, patient was recently hospitalized with sepsis secondary to UTI with cultures growing Pseudomonas aeruginosa's.  She was discharged on ciprofloxacin  and per husband she had a chest x-ray done on Wednesday which showed pneumonia and was started on antibiotics with no improvement.  EMS was called today for worsening mental status and hypotension.  On arrival patient was found with profound hypotension requiring Levophed  drip.  ED Course: Initial vital signs showed HR of 100 beats/minute, BP 51/39 mm Hg, the RR 24 breaths/minute, and the oxygen saturation 99% on 4 L and a temperature of 74F (35C). Pertinent Labs/Diagnostics Findings: Na+/ K+:  Glucose: BUN/Cr.:Calcium :  AST/ALT: WBC: K/L without bands or neutrophil predominance   Hgb/Hct: Plts:  PCT: negative <0.10  Lactic acid:  COVID PCR: Negative,  troponin:   BNP:   ABG: pO2 ***; pCO2 ***; pH ***;  HCO3 ***, %O2 Sat ***.  CXR> CTH> CTA Chest> CT Abd/pelvis> Medication administered in the ED: Disposition:  Past Medical History   . Anxiety    . Arthritis    . Bedbound    . CAD, multiple vessel      s/p CABG 01/13/23  . Depression    . DM type 2  with diabetic peripheral neuropathy (HCC)    . DM2 (diabetes mellitus, type 2) (HCC)    . DVT (deep venous thrombosis) (HCC)    . Gastrostomy tube dependent (HCC)    . History of iron deficiency anemia    . HLD (hyperlipidemia)    . HTN (hypertension)    . Hypothyroidism    . Memory deficit after cerebral infarction    . Migraines    . Morbid obesity (HCC)    . Pulmonary embolism (HCC)    . Sacral decubitus ulcer    . Sacral osteomyelitis (HCC)    . Stroke (HCC)    . 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)    Significant Hospital Events   8/23: Admitted to ICU with acute on chronic hypoxic respiratory failure iso acute PE, pneumonia and Afib with RVR, Brief VF/VT cardiac arrest requiring endotracheal intubation  Consults:  Cardiology   Procedures:  8/23: Endotracheal intubation 8/23: Left IJ central line 8/23: Right femoral art line  Interim History / Subjective:    -Endotracheally intubated  Micro Data:  : SARS-CoV-2 PCR> positive : Influenza PCR> negative : Blood culture x2> : Urine Culture> : MRSA PCR>>  : Strep pneumo urinary antigen> : Legionella urinary antigen>  Antimicrobials:  Vancomycin  Cefepime  Azithromycin Ceftriaxone  Metronidazole  OBJECTIVE  Blood pressure (!) 80/58, pulse (!) 105, temperature (!) 95 F (35 C), temperature source Axillary, resp. rate 20, height 5' (1.524 m), weight 80.3 kg, SpO2 95%.        Intake/Output Summary (Last 24 hours) at 12/27/2023 2024 Last data filed  at 12/27/2023 1943 Gross per 24 hour  Intake 2207.4 ml  Output --  Net 2207.4 ml   Filed Weights   12/27/23 1737  Weight: 80.3 kg    Physical Examination  GENERAL: year-old critically ill patient lying in the bed  EYES: PEERLA. No scleral icterus. Extraocular muscles intact.  HEENT: Head atraumatic, normocephalic. Oropharynx and nasopharynx clear.  CARDIOVASCULAR:S1, S2 normal. No murmurs, rubs, or gallops.   PULMONARY:  Lungs sounds ABDOMINAL: Soft, NTND MUSCULOSKELETAL: No swelling. Normal range of motion. 5/5 strength in all extremities.   NEUROLOGICAL: General: No focal deficit present.  A&O x 4.  PSYCHIATRIC:  Mood and Affect: Mood normal.  SKIN:Skin is warm and dry. No obvious rash, lesion, or ulcer. Capillary Refill:< 2sec   Labs/imaging that I {ACTIONS; HAVE/HAVE NOT:19434}personally reviewed  (right click and Reselect all SmartList Selections daily)     DG Chest Portable 1 View Result Date: 12/27/2023 CLINICAL DATA:  Left IJ central line insertion confirmation. EXAM: PORTABLE CHEST 1 VIEW COMPARISON:  Portable chest today at 6:01 p.m. FINDINGS: 7:28 p.m. there is a new left IJ central line terminating at the superior cavoatrial junction. The cardiac size is normal with again noted CABG change. Stable mediastinum with aortic atherosclerosis. No vascular congestion is seen. No substantial pleural effusion. Moderately elevated right hemidiaphragm. The lungs are hypoexpanded but generally clear with limited view of the right base. No pneumothorax. Electrical pads overlie the lower chest. Multiple overlying telemetry leads. IMPRESSION: 1. New left IJ central line terminating at the superior cavoatrial junction. No pneumothorax. 2. Hypoinflated but generally clear lungs. 3. Aortic atherosclerosis. Electronically Signed   By: Francis Quam M.D.   On: 12/27/2023 20:10   DG Chest Port 1 View Result Date: 12/27/2023 CLINICAL DATA:  Question of sepsis to evaluate for abnormality. Altered mental status. Hypotensive. EXAM: PORTABLE CHEST 1 VIEW COMPARISON:  03/24/2023 FINDINGS: Postoperative changes in the mediastinum. Shallow inspiration. Heart size and pulmonary vascularity are normal. Right lung base opacity may represent atelectasis or pneumonia. This is new since prior study. No pleural effusion or pneumothorax. Mediastinal contours appear intact. IMPRESSION: Infiltration or atelectasis in the right lung base.  Electronically Signed   By: Elsie Gravely M.D.   On: 12/27/2023 18:23     Labs   CBC: Recent Labs  Lab 12/21/23 0601 12/27/23 1759  WBC 5.0 21.8*  NEUTROABS  --  18.9*  HGB 9.3* 8.5*  HCT 29.4* 27.5*  MCV 90.2 92.9  PLT 294 478*    Basic Metabolic Panel: Recent Labs  Lab 12/21/23 0601 12/21/23 1552 12/22/23 0531 12/23/23 0418 12/27/23 1759  NA 139  --  137 135 129*  K 2.4* 3.8 3.4* 3.0* 3.3*  CL 106  --  108 104 100  CO2 23  --  22 25 12*  GLUCOSE 49*  --  47* 73 204*  BUN 5*  --  <5* <5* 8  CREATININE <0.30*  --  <0.30* <0.30* 0.77  CALCIUM  8.6*  --  8.1* 8.5* 8.5*  MG 1.7  --  1.9 1.9 1.8  PHOS 1.4*  --  2.9  --  2.1*   GFR: Estimated Creatinine Clearance: 70.1 mL/min (by C-G formula based on SCr of 0.77 mg/dL). Recent Labs  Lab 12/21/23 0601 12/27/23 1759 12/27/23 1918  WBC 5.0 21.8*  --   LATICACIDVEN  --  2.1* 2.7*    Liver Function Tests: Recent Labs  Lab 12/21/23 0601 12/22/23 0531 12/27/23 1759  AST 26  --  255*  ALT 11  --  113*  ALKPHOS 124  --  228*  BILITOT 0.7  --  1.6*  PROT 4.3*  --  4.2*  ALBUMIN <1.5* <1.5* <1.5*   Recent Labs  Lab 12/27/23 1759  LIPASE 16   No results for input(s): AMMONIA in the last 168 hours.  ABG    Component Value Date/Time   PHART 7.47 (H) 04/06/2023 1004   PCO2ART 43 04/06/2023 1004   PO2ART 89 04/06/2023 1004   HCO3 14.4 (L) 12/27/2023 1741   ACIDBASEDEF 11.5 (H) 12/27/2023 1741   O2SAT 77.3 12/27/2023 1741     Coagulation Profile: No results for input(s): INR, PROTIME in the last 168 hours.  Cardiac Enzymes: No results for input(s): CKTOTAL, CKMB, CKMBINDEX, TROPONINI in the last 168 hours.  HbA1C: Hgb A1c MFr Bld  Date/Time Value Ref Range Status  03/19/2023 04:37 AM 7.6 (H) 4.8 - 5.6 % Final    Comment:    (NOTE) Pre diabetes:          5.7%-6.4%  Diabetes:              >6.4%  Glycemic control for   <7.0% adults with diabetes   03/03/2023 10:31 AM 6.3 (H) 4.8  - 5.6 % Final    Comment:    (NOTE) Pre diabetes:          5.7%-6.4%  Diabetes:              >6.4%  Glycemic control for   <7.0% adults with diabetes     CBG: No results for input(s): GLUCAP in the last 168 hours.  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   Surgical History    Past Surgical History:  Procedure Laterality Date  . CESAREAN SECTION    . CORONARY ARTERY BYPASS GRAFT  01/13/2023  . IR REMOVAL TUN CV CATH W/O FL  03/20/2023  . TUBAL LIGATION       Social History   reports that she quit smoking about 20 years ago. Her smoking use included cigarettes. She started smoking about 45 years ago. She has a 25 pack-year smoking history. She has never used smokeless tobacco. She reports current alcohol  use. She reports that she does not use drugs.   Family History   Her family history includes Diabetes in her maternal grandmother, mother, and sister; Hypertension in her sister; Thyroid  disease in her sister.   Allergies Not on File   Home Medications  Prior to Admission medications   Medication Sig Start Date End Date Taking? Authorizing Provider  amoxicillin -clavulanate (AUGMENTIN ) 875-125 MG tablet Take 1 tablet by mouth 2 (two) times daily. For 10 days. Started 12/25/23   Yes [provider]  apixaban  (ELIQUIS ) 5 MG TABS tablet Take 5 mg by mouth 2 (two) times  daily.   Yes [provider]  atorvastatin  (LIPITOR) 80 MG tablet Take 80 mg by mouth at bedtime. Give 1 tablet via g-tube at bedtime   Yes [provider]  azithromycin (  ZITHROMAX) 500 MG tablet Take 500 mg by mouth daily. For 5 days   Yes [provider]  bisacodyl  (DULCOLAX) 5 MG EC tablet Take 5 mg by mouth daily as needed for moderate constipation.   Yes [provider]  ciprofloxacin  (CIPRO ) 500 MG tablet Place 1 tablet (500 mg total) into feeding tube 2 (two) times daily for 8 doses. 12/23/23 12/27/23 Yes Jens Durand, MD  famotidine  (PEPCID ) 40 MG tablet Take 40 mg by mouth at bedtime.   Yes [provider]  gabapentin  (NEURONTIN ) 300 MG capsule Take 3 capsules (900 mg total) by mouth 3 (three) times daily. Patient taking differently: Place 900 mg into feeding tube 3 (three) times daily. 01/02/18  Yes Weber, Lauraine CROME, PA-C  HYDROcodone -acetaminophen  (NORCO/VICODIN) 5-325 MG tablet Take 2 tablets by mouth every 4 (four) hours as needed for moderate pain (pain score 4-6).   Yes [provider]  levothyroxine  (SYNTHROID ) 50 MCG tablet Take 50 mcg by mouth daily.   Yes [provider]  methenamine (MANDELAMINE) 1 g tablet Take 1,000 mg by mouth 2 (two) times daily.   Yes [provider]  midodrine  (PROAMATINE ) 10 MG tablet Take 1 tablet (10 mg total) by mouth 3 (three) times daily. 12/23/23  Yes Jens Durand, MD  ondansetron  (ZOFRAN -ODT) 4 MG disintegrating tablet Take 4 mg by mouth every 8 (eight) hours as needed for nausea or vomiting.   Yes [provider]  polyethylene glycol (MIRALAX  / GLYCOLAX ) 17 g packet Take 17 g by mouth daily. Patient taking differently: Take 17 g by mouth daily. Via g-tube. One time a day, mix with 6-8 ounces of liquid 12/11/23  Yes Laurita Pillion, MD  potassium chloride  (KLOR-CON ) 20 MEQ packet Place 20 mEq into feeding tube daily for 3 days. 12/24/23 12/27/23 Yes Jens Durand, MD   promethazine (PHENERGAN) 25 MG tablet Take 25 mg by mouth every 6 (six) hours as needed for nausea or vomiting.   Yes [provider]  senna-docusate (SENOKOT-S) 8.6-50 MG tablet Take 1 tablet by mouth at bedtime.   Yes [provider]  sertraline  (ZOLOFT ) 50 MG tablet Take 1 tablet (50 mg total) by mouth daily. 12/24/23  Yes Jens Durand, MD  zinc  sulfate, 50mg  elemental zinc , 220 (50 Zn) MG capsule Take 1 capsule (220 mg total) by mouth daily. 12/24/23  Yes Jens Durand, MD     Active Hospital Problem list   See systems below  Assessment & Plan:      Best practice:  Diet:  {Ipzu:73067} Pain/Anxiety/Delirium protocol (if indicated): {Pain/Anxiety/Delirium:26941} VAP protocol (if indicated): {VAP:29640} DVT prophylaxis: {DVT Prophylaxis:26933} GI prophylaxis: {GI:26934} Glucose control:  {Glucose Control:26935} Central venous access:  {Central Venous Access:26936} Arterial line:  {Central Venous Access:26936} Foley:  {Central Venous Access:26936} Mobility:  {Mobility:26937}  PT consulted: {PT Consult:26938} Last date of multidisciplinary goals of care discussion []  Code Status:  {Code Status:26939} Disposition: ***   = Goals of Care = Code Status Order: @CODE @   Primary Emergency Contact: Gapski,James Wishes to pursue full aggressive treatment and intervention options, including CPR and intubation, but goals of care will be addressed on going with family if that should become necessary. Patient wishes to pursue ongoing treatment with relatively conservative measures (e.g., IV fluids, antibiotics), but would not wish to escalate to ICU level of care or other invasive measures. Patient wishes to pursue ongoing treatment, but concurred that if deteriorated to pulselessness, patient would prefer a natural death as opposed to invasive measures such as  CPR and intubation.  Family should be immediately contacted in such situations.  Critical care time: 45 minutes         Almarie Nose DNP, CCRN, FNP-C, AGACNP-BC Acute Care & Family Nurse Practitioner [facility] PCCM on call pager [phone]

## 2024-01-05 NOTE — Progress Notes (Signed)
 PHARMACY - ANTICOAGULATION CONSULT NOTE  Pharmacy Consult for heparin  infusion Indication: pulmonary embolus  Not on File  Patient Measurements: Height: 5' (152.4 cm) Weight: 80.3 kg (177 lb 0.5 oz) IBW/kg (Calculated) : 45.5 HEPARIN  DW (KG): 63.9  Vital Signs: Temp: 98.4 F (36.9 C) 01-02-2024 0600) Temp Source: Axillary (08/23 1923) BP: 49/19 01-02-2024 0200) Pulse Rate: 79 01/02/2024 0106)  Labs: Recent Labs    12/27/23 1759 12/27/23 2145 2024-01-02 0110 2024/01/02 0410  HGB 8.5*  --   --  9.2*  HCT 27.5*  --   --  29.8*  PLT 478*  --   --  526*  APTT  --   --   --  >200*  LABPROT 65.3*  --   --  >90.0*  INR 7.3*  --   --  >10.0*  HEPARINUNFRC  --   --   --  >1.10*  CREATININE 0.77  --   --   --   TROPONINIHS  --  193* 751*  --     Estimated Creatinine Clearance: 70.1 mL/min (by C-G formula based on SCr of 0.77 mg/dL).   Medical History: Past Medical History:  Diagnosis Date   Anxiety    Arthritis    Bedbound    CAD, multiple vessel    s/p CABG 01/13/23   Depression    DM type 2 with diabetic peripheral neuropathy (HCC)    DM2 (diabetes mellitus, type 2) (HCC)    DVT (deep venous thrombosis) (HCC)    Gastrostomy tube dependent (HCC)    History of iron deficiency anemia    HLD (hyperlipidemia)    HTN (hypertension)    Hypothyroidism    Memory deficit after cerebral infarction    Migraines    Morbid obesity (HCC)    Pulmonary embolism (HCC)    Sacral decubitus ulcer    Sacral osteomyelitis (HCC)    Stroke (HCC)    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)     Medications:  Scheduled:   Chlorhexidine  Gluconate Cloth  6 each Topical Daily   insulin  aspart  0-9 Units Subcutaneous Q4H   ipratropium-albuterol   3 mL Nebulization Q6H   mouth rinse  15 mL Mouth Rinse Q2H   Infusions:   sodium chloride  20 mL/hr at 02-Jan-2024 0400   amiodarone  Stopped (Jan 02, 2024 0029)   amiodarone      ceFEPime  (MAXIPIME ) IV Stopped (02-Jan-2024 0252)    ciprofloxacin  Stopped (12/27/23 2355)   epinephrine  20 mcg/min (2024/01/02 0521)   heparin  1,100 Units/hr (01/02/24 0400)   methylene blue  (antidote) 250 mg in dextrose  5 % 250 mL (1 mg/mL) infusion 0.5 mg/kg/hr (Jan 02, 2024 0400)   norepinephrine  (LEVOPHED ) Adult infusion 40 mcg/min (01-02-24 0547)   sodium bicarbonate  150 mEq in sterile water  1,150 mL infusion 250 mL/hr at 2024/01/02 9375   Followed by   NOREEN ON 12/29/2023] sodium bicarbonate  150 mEq in sterile water  1,150 mL infusion     vancomycin      vasopressin  0.04 Units/min (2024/01/02 0519)   PRN: amiodarone , docusate sodium , ipratropium-albuterol , mouth rinse, polyethylene glycol  Assessment: 63 y.o. female w/ PMH of PMH including T2DM, hypothyroidism, sacral osteomyelitis, CAD status post CABG, previous DVT/PE, chronic opiate use, and history of sacral decubitus with osteomyelitis admitted on 12/27/2023 with presenting with severe sepsis and hypotension requiring vasopressor support. CT angio reveals PE. She is noted to be on apixaban  PTA w/ last dose this morning  Baseline Labs: pending  Goal of Therapy:  Heparin  level 0.3-0.7 units/ml  aPTT 66 - 102 seconds Monitor platelets by anticoagulation protocol: Yes   Plan: aPTT/heparin  level both supratherapeutic ---hold heparin  x 1 hour (messaged RN) ---reduce heparin  infusion rate to 900 units/hr ---Check aPTT level in 6 hours after rate change (we will use aPTT to guide therapy until aPTT and anti-Xa levels correlate) ---Continue to monitor H&H and platelets  Ruth Mayo 2024-01-19,6:33 AM

## 2024-01-05 NOTE — Death Summary Note (Signed)
 DEATH SUMMARY   Patient Details  Name: Ruth Mayo MRN: [id number] DOB: [birth year]  Admission/Discharge Information   Admit Date:  01/25/24  Date of Death: Date of Death: 2024-01-26  Time of Death: Time of Death: 1550  Length of Stay: 1  Referring Physician: Allen Lauraine CROME, PA-C   Reason(s) for Hospitalization  Septic Shock  Diagnoses  Preliminary cause of death:  Secondary Diagnoses (including complications and co-morbidities):  Principal Problem:   Severe sepsis with acute organ dysfunction [facility])   Brief Hospital Course (including significant findings, care, treatment, and services provided and events leading to death)  [name] is a 63 y.o. 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 She presented to the ED with weakness and admitted to the ICU with septic shock.  Patient with history of recurrent sepsis secondary to UTI and infected pressure ulcers. On admission to the hospital, she's had worsening septic shock with multi-organ failure. She is on multiple vasopressors (nor-epinephrine , vasopressin , epinephrine , and methylene blue ) without response in blood pressure. She's on broad spectrum antibiotics. Lactic acid is > 9, and kidney function is worsening. She has severe acidemia.  Discussed overall prognosis with family and they decided to withdraw care. Patient compassionately extubated and passed away in the presence of her family.    Pertinent Labs and Studies  Significant Diagnostic Studies ECHOCARDIOGRAM COMPLETE Result Date:  01-26-24    ECHOCARDIOGRAM REPORT   Patient Name:   Ruth Mayo Date of Exam: 26-Jan-2024 Medical Rec #:  [id number]     Height:       60.0 in Accession #:    [id number]    Weight:       177.0 lb Date of Birth:  02/17/1961     BSA:          1.772 m Patient Age:    63 years      BP:           49/17 mmHg Patient Gender: F             HR:           127 bpm. Exam Location:  [facility] Procedure: 2D Echo, Cardiac Doppler and Color Doppler (Both Spectral and Color            Flow Doppler were utilized during procedure). Indications:     Pericardial effusion I31.3                  Pulmonary Embolus I26.09  History:         Patient has no prior history of Echocardiogram examinations.  Sonographer:     Thedora Louder RDCS, FASE Referring Phys:  [id number] ADITYA PALIWAL Diagnosing Phys: Annabella Scarce MD  Sonographer Comments: Technically challenging study due to limited acoustic windows, suboptimal apical window, echo performed with patient supine and on artificial respirator and patient is obese. Image acquisition challenging due to patient body habitus. IMPRESSIONS  1. Left ventricular ejection fraction, by estimation, is 40 to 45%. Left ventricular ejection fraction by PLAX is 42 %. The left ventricle has mildly decreased function. The left ventricle demonstrates global hypokinesis. There is moderate left ventricular hypertrophy. Left ventricular  diastolic parameters are indeterminate.  2. Right ventricular systolic function is moderately reduced. The right ventricular size is normal.  3. The mitral valve is normal in structure. No evidence of mitral valve regurgitation. No evidence of mitral stenosis.  4. The aortic valve is tricuspid. Aortic valve regurgitation is not visualized. No aortic stenosis is present. FINDINGS  Left Ventricle: Left ventricular ejection fraction, by estimation, is 40 to 45%. Left ventricular ejection fraction by PLAX is 42 %. The left ventricle has mildly decreased function. The left ventricle  demonstrates global hypokinesis. The left ventricular internal cavity size was normal in size. There is moderate left ventricular hypertrophy. Left ventricular diastolic parameters are indeterminate. Indeterminate filling pressures. Right Ventricle: The right ventricular size is normal. No increase in right ventricular wall thickness. Right ventricular systolic function is moderately reduced. Left Atrium: Left atrial size was normal in size. Right Atrium: Right atrial size was normal in size. Pericardium: There is no evidence of pericardial effusion. Mitral Valve: The mitral valve is normal in structure. No evidence of mitral valve regurgitation. No evidence of mitral valve stenosis. Tricuspid Valve: The tricuspid valve is normal in structure. Tricuspid valve regurgitation is not demonstrated. No evidence of tricuspid stenosis. Aortic Valve: The aortic valve is tricuspid. Aortic valve regurgitation is not visualized. No aortic stenosis is present. Pulmonic Valve: The pulmonic valve was normal in structure. Pulmonic valve regurgitation is not visualized. No evidence of pulmonic stenosis. Aorta: The aortic root is normal in size and structure. Venous: IVC assessment for right atrial pressure unable to be performed due to mechanical ventilation. IAS/Shunts: No atrial level shunt detected by color flow Doppler.  LEFT VENTRICLE PLAX 2D LV EF:         Left            Diastology                ventricular     LV e' medial:    5.66 cm/s                ejection        LV E/e' medial:  12.5                fraction by     LV e' lateral:   8.70 cm/s                PLAX is 42      LV E/e' lateral: 8.1                %. LVIDd:         3.50 cm LVIDs:         2.80 cm LV PW:         1.10 cm LV IVS:        1.40 cm LVOT diam:     1.90 cm LV SV:         18 LV SV Index:   10 LVOT Area:     2.84 cm  LEFT ATRIUM           Index LA diam:      2.10 cm 1.18 cm/m LA Vol (A4C): 7.6 ml  4.31 ml/m  AORTIC VALVE             PULMONIC VALVE LVOT  Vmax:   42.30 cm/s  PV Vmax:        0.75 m/s LVOT Vmean:  26.600 cm/s PV Peak grad:   2.3 mmHg LVOT VTI:  0.062 m     RVOT Peak grad: 2 mmHg  AORTA Ao Root diam: 2.80 cm Ao Asc diam:  2.70 cm MITRAL VALVE MV Area (PHT): 7.90 cm    SHUNTS MV Decel Time: 96 msec     Systemic VTI:  0.06 m MV E velocity: 70.70 cm/s  Systemic Diam: 1.90 cm Annabella Scarce MD Electronically signed by Annabella Scarce MD Signature Date/Time: Jan 22, 2024/10:02:17 AM    Final (Updated)    Portable Chest x-ray Result Date: 12/27/2023 CLINICAL DATA:  [id number] Endotracheal tube present [id number] EXAM: PORTABLE CHEST 1 VIEW COMPARISON:  Chest x-ray 12/27/2023 7:28 p.m. FINDINGS: Interval placement of an endotracheal tube with tip terminating 2.5 cm above the carina. Left internal jugular central venous catheter with tip overlying the expected region of the superior caval junction. Cardiac paddles overlie the chest. Lines and tubes overlie the chest. The heart and mediastinal contours are unchanged. Vascular clips overlie the mediastinum. No focal consolidation. No pulmonary edema. No pleural effusion. No pneumothorax. No acute osseous abnormality. IMPRESSION: 1. No active disease. 2. Endotracheal tube and left internal jugular central venous catheter in appropriate position. Electronically Signed   By: Morgane  Naveau M.D.   On: 12/27/2023 22:44   CT Angio Chest PE W and/or Wo Contrast Result Date: 12/27/2023 CLINICAL DATA:  Hypotension, altered mental status and suspected sepsis and or pulmonary embolism. EXAM: CT ANGIOGRAPHY CHEST CT ABDOMEN AND PELVIS WITH CONTRAST TECHNIQUE: Multidetector CT imaging of the chest was performed using the standard protocol during bolus administration of intravenous contrast. Multiplanar CT image reconstructions and MIPs were obtained to evaluate the vascular anatomy. Multidetector CT imaging of the abdomen and pelvis was performed using the standard protocol during bolus administration of intravenous  contrast. RADIATION DOSE REDUCTION: This exam was performed according to the departmental dose-optimization program which includes automated exposure control, adjustment of the mA and/or kV according to patient size and/or use of iterative reconstruction technique. CONTRAST:  OMNIPAQUE  IOHEXOL  350 MG/ML SOLN COMPARISON:  Portable chest today, chest radiograph 03/24/2023, and CTs of abdomen and pelvis with IV contrast 12/20/2023, 12/01/2023 and 10/17/2023. FINDINGS: CTA CHEST FINDINGS Cardiovascular: There are CABG changes and a healed median sternotomy, heavy native calcific CAD. There is no pericardial effusion. The cardiac size is normal. Pulmonary veins are nondistended. Left IJ central line terminates at the superior cavoatrial junction. There is atherosclerosis in the aortic arch and great vessels without aortic aneurysm, stenosis or dissection. 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 Arterial opacification is diagnostic. There is mild respiratory motion on exam. The pulmonary trunk is upper limit of normal in caliber and unchanged. There is a near occlusive acute clot in the left lower lobe main artery. Some of the small caliber downstream basilar subsegmental branches probably also harbor  clot but are not well seen. Also noted is a small nonocclusive clot in the distal lingular main artery on 4:70. The remaining pulmonary arteries appear clear. This is an overall small clot burden without findings of acute right heart strain. Mediastinum/Nodes: Mediastinal lipomatosis appears similar, exaggerating the superior mediastinum. Slightly prominent low right paratracheal lymph node is 1.1 cm in short axis. There is no further adenopathy. There are scattered shotty subcentimeter lymph nodes in the superior mediastinum. Negative axillary spaces, thyroid , thoracic esophagus is patulous with normal wall thickness. There is moderate focal tracheal narrowing at the level of T1-2 which  could be due to scarring related to prior intubation trauma. This is at the level of  the thyroid , but the thyroid  gland is not enlarged and should not be compressing the trachea. The thoracic trachea is otherwise unremarkable. Both of the main bronchi are clear. Lungs/Pleura: There is patchy nodular airspace disease in the superior segment of the left lower lobe, seen on all prior studies and may reflect evidence of chronic aspiration. There is chronic atelectasis in the posterior lung bases. There is moderate chronic elevation right diaphragm. There is increased band consolidation in the superior segment of the right lower lobe which could be due to atelectasis, pneumonia or aspiration. There is new airspace disease in the dorsal lingula consistent with pneumonia or aspiration. Mild diffuse bronchial thickening. Minimal left and trace right pleural effusions are also new. Above the plane of prior studies there is a rounded 6 mm subpleural right upper lobe nodule posteriorly on 6:51. There is asymmetric right apical patchy opacity which could be additional pneumonia or asymmetric scarring. Rest of the lungs appear generally clear.  No edema is seen. Musculoskeletal: Chronic appearance of mild central upper plate compression fracture deformities of C7, T1, and T2. Thoracic spondylosis. There is mild osteopenia. No acute or other significant osseous abnormality. There is edema in the chest wall which is probably due to third spacing and fluid overload. No chest wall mass is evident. Review of the MIP images confirms the above findings. CT ABDOMEN and PELVIS FINDINGS Hepatobiliary: Moderate to severe increased hepatic steatosis. No mass. Limited fine detail due to breathing motion. There is a 1.2 cm noncalcified stone versus tumefactive sludge in the gallbladder 11:30. No wall thickening or biliary dilatation. Pancreas: Generalized atrophy. There are some edematous changes at the level of the pancreas but is difficult  to tell if this is part of the generalized mesenteric congestion noted present or if this is due to coexisting pancreatitis. Laboratory and clinical correlation recommended. There is no pancreatic mass or ductal dilatation. Spleen: No abnormality. Adrenals/Urinary Tract: No adrenal or renal mass enhancement. No evidence of renal obstruction. Fall single 1 mm caliceal stone lower pole right kidney is noted and occasional 1-2 mm stones in left renal collecting system. No ureteral stone. The bladder is catheterized and contracted, not well seen. There are perivesical stranding changes however, concerning for cystitis. Stomach/Bowel: Mild thickened folds in fluid filling of the stomach. There are thickened folds in the duodenum. Rest of the small bowel is unremarkable. There is a normal appendix. There is fluid in the colon without wall thickening or inflammation. Gastrostomy tube again noted with the retention balloon in the body of stomach. Vascular/Lymphatic: Heavy aortic and branch vessel atherosclerosis. No AAA. No adenopathy. Reproductive: Uterus and bilateral adnexa are unremarkable. Other: Minimal ascites. A similar body wall anasarca to the last CT. Mesenteric congestion is similar to August 16. No free air. Stranding in the lower presacral space is also similar, probably related to a large sacral decubitus ulcer known to be present. Musculoskeletal: Sizable sacral decubitus ulcer right of the midline again noted with underlying sacrococcygeal destruction beginning at about S4 eccentric to the right. No new abnormality. There are extensive heterotopic bone formations above both hips. Osteopenia and degenerative change lumbar spine. Review of the MIP images confirms the above findings. IMPRESSION: 1. Acute near occlusive clot in the left lower lobe main artery, with likely extension into some of the downstream subsegmental arteries, and small nonocclusive clot in the distal lingular main artery. The remaining  pulmonary arteries appear clear. This is an overall small clot burden without findings of acute right  heart strain. 2. Increased airspace disease in the superior segment of the right lower lobe and dorsal lingula consistent with pneumonia or aspiration. 3. Chronic nodular airspace disease in the superior segment of the left lower lobe which may reflect chronic aspiration. 4. Minimal left and trace right pleural effusions, new. 5. 6 mm right upper lobe nodule. Non-contrast chest CT at 6-12 months is recommended. If the nodule is stable at time of repeat CT, then future CT at 18-24 months (from today's scan) is considered optional for low-risk patients, but is recommended for high-risk patients. This recommendation follows the consensus statement: Guidelines for Management of Incidental Pulmonary Nodules Detected on CT Images: From the Fleischner Society 2017; Radiology 2017; 284:228-243. 6. Aortic and coronary artery atherosclerosis.  Prior CABG. 7. 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 8. Moderate focal tracheal narrowing at the level of T1-2 which could be due to scarring related to prior intubation trauma. 9. Increased hepatic steatosis. 10. Noncalcified stone versus tumefactive sludge in the gallbladder. No wall thickening or biliary dilatation. 11. Nonobstructive nephrolithiasis. 12. Edematous changes at the level of the pancreas which could be part of the generalized mesenteric congestion noted present or due to coexisting pancreatitis. 13. Likely cystitis. 14. Fluid in the colon without wall thickening or inflammation. 15. Gastrostomy tube in place. 16. Sizable sacral decubitus ulcer right of the midline with underlying sacrococcygeal destruction beginning at about S4 eccentric to the right. Aortic Atherosclerosis (ICD10-I70.0). Electronically Signed   By: Francis Quam M.D.   On: 12/27/2023 21:31   CT ABDOMEN PELVIS W CONTRAST Result Date: 12/27/2023 CLINICAL DATA:  Hypotension,  altered mental status and suspected sepsis and or pulmonary embolism. EXAM: CT ANGIOGRAPHY CHEST CT ABDOMEN AND PELVIS WITH CONTRAST TECHNIQUE: Multidetector CT imaging of the chest was performed using the standard protocol during bolus administration of intravenous contrast. Multiplanar CT image reconstructions and MIPs were obtained to evaluate the vascular anatomy. Multidetector CT imaging of the abdomen and pelvis was performed using the standard protocol during bolus administration of intravenous contrast. RADIATION DOSE REDUCTION: This exam was performed according to the departmental dose-optimization program which includes automated exposure control, adjustment of the mA and/or kV according to patient size and/or use of iterative reconstruction technique. CONTRAST:  OMNIPAQUE  IOHEXOL  350 MG/ML SOLN COMPARISON:  Portable chest today, chest radiograph 03/24/2023, and CTs of abdomen and pelvis with IV contrast 12/20/2023, 12/01/2023 and 10/17/2023. FINDINGS: CTA CHEST FINDINGS Cardiovascular: There are CABG changes and a healed median sternotomy, heavy native calcific CAD. There is no pericardial effusion. The cardiac size is normal. Pulmonary veins are nondistended. Left IJ central line terminates at the superior cavoatrial junction. There is atherosclerosis in the aortic arch and great vessels without aortic aneurysm, stenosis or dissection. 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 Arterial opacification is diagnostic. There is mild respiratory motion on exam. The pulmonary trunk is upper limit of normal in caliber and unchanged. There is a near occlusive acute clot in the left lower lobe main artery. Some of the small caliber downstream basilar subsegmental branches probably also harbor  clot but are not well seen. Also noted is a small nonocclusive clot in the distal lingular main artery on 4:70. The remaining pulmonary arteries appear clear. This is an overall small clot  burden without findings of acute right heart strain. Mediastinum/Nodes: Mediastinal lipomatosis appears similar, exaggerating the superior mediastinum. Slightly prominent low right paratracheal lymph node is 1.1 cm in short axis. There is  no further adenopathy. There are scattered shotty subcentimeter lymph nodes in the superior mediastinum. Negative axillary spaces, thyroid , thoracic esophagus is patulous with normal wall thickness. There is moderate focal tracheal narrowing at the level of T1-2 which could be due to scarring related to prior intubation trauma. This is at the level of the thyroid , but the thyroid  gland is not enlarged and should not be compressing the trachea. The thoracic trachea is otherwise unremarkable. Both of the main bronchi are clear. Lungs/Pleura: There is patchy nodular airspace disease in the superior segment of the left lower lobe, seen on all prior studies and may reflect evidence of chronic aspiration. There is chronic atelectasis in the posterior lung bases. There is moderate chronic elevation right diaphragm. There is increased band consolidation in the superior segment of the right lower lobe which could be due to atelectasis, pneumonia or aspiration. There is new airspace disease in the dorsal lingula consistent with pneumonia or aspiration. Mild diffuse bronchial thickening. Minimal left and trace right pleural effusions are also new. Above the plane of prior studies there is a rounded 6 mm subpleural right upper lobe nodule posteriorly on 6:51. There is asymmetric right apical patchy opacity which could be additional pneumonia or asymmetric scarring. Rest of the lungs appear generally clear.  No edema is seen. Musculoskeletal: Chronic appearance of mild central upper plate compression fracture deformities of C7, T1, and T2. Thoracic spondylosis. There is mild osteopenia. No acute or other significant osseous abnormality. There is edema in the chest wall which is probably due to  third spacing and fluid overload. No chest wall mass is evident. Review of the MIP images confirms the above findings. CT ABDOMEN and PELVIS FINDINGS Hepatobiliary: Moderate to severe increased hepatic steatosis. No mass. Limited fine detail due to breathing motion. There is a 1.2 cm noncalcified stone versus tumefactive sludge in the gallbladder 11:30. No wall thickening or biliary dilatation. Pancreas: Generalized atrophy. There are some edematous changes at the level of the pancreas but is difficult to tell if this is part of the generalized mesenteric congestion noted present or if this is due to coexisting pancreatitis. Laboratory and clinical correlation recommended. There is no pancreatic mass or ductal dilatation. Spleen: No abnormality. Adrenals/Urinary Tract: No adrenal or renal mass enhancement. No evidence of renal obstruction. Fall single 1 mm caliceal stone lower pole right kidney is noted and occasional 1-2 mm stones in left renal collecting system. No ureteral stone. The bladder is catheterized and contracted, not well seen. There are perivesical stranding changes however, concerning for cystitis. Stomach/Bowel: Mild thickened folds in fluid filling of the stomach. There are thickened folds in the duodenum. Rest of the small bowel is unremarkable. There is a normal appendix. There is fluid in the colon without wall thickening or inflammation. Gastrostomy tube again noted with the retention balloon in the body of stomach. Vascular/Lymphatic: Heavy aortic and branch vessel atherosclerosis. No AAA. No adenopathy. Reproductive: Uterus and bilateral adnexa are unremarkable. Other: Minimal ascites. A similar body wall anasarca to the last CT. Mesenteric congestion is similar to August 16. No free air. Stranding in the lower presacral space is also similar, probably related to a large sacral decubitus ulcer known to be present. Musculoskeletal: Sizable sacral decubitus ulcer right of the midline again noted  with underlying sacrococcygeal destruction beginning at about S4 eccentric to the right. No new abnormality. There are extensive heterotopic bone formations above both hips. Osteopenia and degenerative change lumbar spine. Review of the MIP images confirms  the above findings. IMPRESSION: 1. Acute near occlusive clot in the left lower lobe main artery, with likely extension into some of the downstream subsegmental arteries, and small nonocclusive clot in the distal lingular main artery. The remaining pulmonary arteries appear clear. This is an overall small clot burden without findings of acute right heart strain. 2. Increased airspace disease in the superior segment of the right lower lobe and dorsal lingula consistent with pneumonia or aspiration. 3. Chronic nodular airspace disease in the superior segment of the left lower lobe which may reflect chronic aspiration. 4. Minimal left and trace right pleural effusions, new. 5. 6 mm right upper lobe nodule. Non-contrast chest CT at 6-12 months is recommended. If the nodule is stable at time of repeat CT, then future CT at 18-24 months (from today's scan) is considered optional for low-risk patients, but is recommended for high-risk patients. This recommendation follows the consensus statement: Guidelines for Management of Incidental Pulmonary Nodules Detected on CT Images: From the Fleischner Society 2017; Radiology 2017; 284:228-243. 6. Aortic and coronary artery atherosclerosis.  Prior CABG. 7. 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 8. Moderate focal tracheal narrowing at the level of T1-2 which could be due to scarring related to prior intubation trauma. 9. Increased hepatic steatosis. 10. Noncalcified stone versus tumefactive sludge in the gallbladder. No wall thickening or biliary dilatation. 11. Nonobstructive nephrolithiasis. 12. Edematous changes at the level of the pancreas which could be part of the generalized mesenteric congestion noted  present or due to coexisting pancreatitis. 13. Likely cystitis. 14. Fluid in the colon without wall thickening or inflammation. 15. Gastrostomy tube in place. 16. Sizable sacral decubitus ulcer right of the midline with underlying sacrococcygeal destruction beginning at about S4 eccentric to the right. Aortic Atherosclerosis (ICD10-I70.0). Electronically Signed   By: Francis Quam M.D.   On: 12/27/2023 21:31   CT Head Wo Contrast Result Date: 12/27/2023 CLINICAL DATA:  Mental status change, unknown cause EXAM: CT HEAD WITHOUT CONTRAST TECHNIQUE: Contiguous axial images were obtained from the base of the skull through the vertex without intravenous contrast. RADIATION DOSE REDUCTION: This exam was performed according to the departmental dose-optimization program which includes automated exposure control, adjustment of the mA and/or kV according to patient size and/or use of iterative reconstruction technique. COMPARISON:  None Available. FINDINGS: Motion limited. Brain: No evidence of acute infarction, hemorrhage, hydrocephalus, extra-axial collection or mass lesion/mass effect. Vascular: No hyperdense vessel. Skull: Normal. Negative for fracture or focal lesion. Sinuses/Orbits: Clear sinuses.  No acute orbital findings. IMPRESSION: Motion limited study without evidence of acute abnormality. Electronically Signed   By: Gilmore GORMAN Molt M.D.   On: 12/27/2023 20:48   DG Chest Portable 1 View Result Date: 12/27/2023 CLINICAL DATA:  Left IJ central line insertion confirmation. EXAM: PORTABLE CHEST 1 VIEW COMPARISON:  Portable chest today at 6:01 p.m. FINDINGS: 7:28 p.m. there is a new left IJ central line terminating at the superior cavoatrial junction. The cardiac size is normal with again noted CABG change. Stable mediastinum with aortic atherosclerosis. No vascular congestion is seen. No substantial pleural effusion. Moderately elevated right hemidiaphragm. The lungs are hypoexpanded but generally clear with  limited view of the right base. No pneumothorax. Electrical pads overlie the lower chest. Multiple overlying telemetry leads. IMPRESSION: 1. New left IJ central line terminating at the superior cavoatrial junction. No pneumothorax. 2. Hypoinflated but generally clear lungs. 3. Aortic atherosclerosis. Electronically Signed   By: Francis Quam M.D.   On: 12/27/2023 20:10  DG Chest Port 1 View Result Date: 12/27/2023 CLINICAL DATA:  Question of sepsis to evaluate for abnormality. Altered mental status. Hypotensive. EXAM: PORTABLE CHEST 1 VIEW COMPARISON:  03/24/2023 FINDINGS: Postoperative changes in the mediastinum. Shallow inspiration. Heart size and pulmonary vascularity are normal. Right lung base opacity may represent atelectasis or pneumonia. This is new since prior study. No pleural effusion or pneumothorax. Mediastinal contours appear intact. IMPRESSION: Infiltration or atelectasis in the right lung base. Electronically Signed   By: Elsie Gravely M.D.   On: 12/27/2023 18:23   CT ABDOMEN PELVIS W CONTRAST Addendum Date: 12/20/2023 ADDENDUM REPORT: 12/20/2023 19:16 ADDENDUM: Additional impression. Similar slightly nodular appearing airspace opacity in the left lung base, could be due to chronic aspiration but given lack of resolution and no significant change in appearance, some concern raised for neoplasm. Suggest pulmonary consultation. Electronically Signed   By: Luke Bun M.D.   On: 12/20/2023 19:16   Result Date: 12/20/2023 CLINICAL DATA:  Abdominal pain nausea vomiting diarrhea EXAM: CT ABDOMEN AND PELVIS WITH CONTRAST TECHNIQUE: Multidetector CT imaging of the abdomen and pelvis was performed using the standard protocol following bolus administration of intravenous contrast. RADIATION DOSE REDUCTION: This exam was performed according to the departmental dose-optimization program which includes automated exposure control, adjustment of the mA and/or kV according to patient size and/or use  of iterative reconstruction technique. CONTRAST:  75mL OMNIPAQUE  IOHEXOL  300 MG/ML  SOLN COMPARISON:  CT 12/01/2023, 10/17/2023 FINDINGS: Lower chest: Lower sternotomy. Bandlike density in the right posterior lung base suggestive of scarring. Slightly nodular appearing airspace disease within the left lung base, no change in appearance or configuration since June CT. Coronary vascular calcification. Hepatobiliary: No calcified gallstones.  No biliary distension. Pancreas: No ductal dilatation. Slightly indistinct appearance of peripancreatic fat planes at the head. Suspicion of small volume fluid between the duodenum and head of pancreas, series 2, image 32. Spleen: Normal in size without focal abnormality. Adrenals/Urinary Tract: Adrenal glands are normal. Kidneys show no hydronephrosis. Punctate nonobstructing bilateral kidney stones. Catheter in the bladder. Bladder appears diffusely thick walled with mild mucosal enhancement. Stomach/Bowel: Gastrostomy tube in the stomach. No dilated small bowel. No acute bowel wall thickening. Large stool burden with moderate rectal distension by feces. Vascular/Lymphatic: Advanced aortic atherosclerosis. No aneurysm. No suspicious lymph nodes Reproductive: Uterus and bilateral adnexa are unremarkable. Other: Negative for free air or ascites. Small volume gas within the subcutaneous soft tissues of the lower anterior abdominal wall. Musculoskeletal: Deep sacral decubitus ulcer extending to the bone. Erosive changes at the sacrococcygeal region, grossly similar compared with 7/28 exam consistent with osteomyelitis. Residual presacral soft tissue stranding but appears slightly improved compared with 12/01/2023. Slight decreased size of small rim enhancing fluid collection within the right medial gluteal region, this measures 13 x 11 mm previously 17 x 17 mm. IMPRESSION: 1. Slightly indistinct appearance of peripancreatic fat planes at the head of pancreas with suspicion of small  volume fluid between the duodenum and head of pancreas, suggest correlation with pancreatic enzymes to exclude pancreatitis. Duodenitis could also be considered. 2. Large stool burden with moderate rectal distension by feces. 3. Deep sacral decubitus ulcer extending to the bone with erosive changes at the sacrococcygeal region consistent with osteomyelitis, grossly stable compared with 12/01/2023 exam. Residual presacral soft tissue stranding but appears slightly improved compared with 12/01/2023. Slight decreased size of small rim enhancing fluid collection within the right medial gluteal region. 4. Punctate nonobstructing bilateral kidney stones. 5. Aortic atherosclerosis. Aortic Atherosclerosis (ICD10-I70.0). Electronically  Signed: By: Luke Bun M.D. On: 12/20/2023 18:50   CT ABDOMEN PELVIS W CONTRAST Result Date: 12/01/2023 CLINICAL DATA:  Abdominal pain, acute, nonlocalized weakness, abd pain, hx sacral osteomyelitis EXAM: CT ABDOMEN AND PELVIS WITH CONTRAST TECHNIQUE: Multidetector CT imaging of the abdomen and pelvis was performed using the standard protocol following bolus administration of intravenous contrast. RADIATION DOSE REDUCTION: This exam was performed according to the departmental dose-optimization program which includes automated exposure control, adjustment of the mA and/or kV according to patient size and/or use of iterative reconstruction technique. CONTRAST:  OMNIPAQUE  IOHEXOL  300 MG/ML  SOLN COMPARISON:  October 17, 2023 FINDINGS: Lower chest: Dependent airspace opacities in both lung bases, more nodular on the left, worrisome for aspiration. No pleural effusion. Multi-vessel coronary atherosclerosis. Sternotomy wires. Hepatobiliary: No mass.No radiopaque stones or wall thickening of the gallbladder. No intrahepatic or extrahepatic biliary ductal dilation. The portal veins are patent. Pancreas: Diffuse fatty atrophy of the pancreatic parenchyma. No mass or ductal dilation. no  peripancreatic inflammation or fluid collection. Spleen: Normal size. No mass. Adrenals/Urinary Tract: No adrenal masses. No renal mass. Multiple small nonobstructive bilateral calyceal calculi. No hydronephrosis. The urinary bladder is completely decompressed with a urinary catheter in place. Gas in the bladder lumen is likely related to catheterization. Circumferential wall thickening of the urinary bladder. Stomach/Bowel: The stomach contains ingested material without focal abnormality. Percutaneous gastrostomy tube well-positioned in the gastric antrum. No small bowel wall thickening or inflammation. No small bowel obstruction.Normal appendix. Vascular/Lymphatic: No aortic aneurysm. Diffuse aortoiliac atherosclerosis. No intraabdominal or pelvic lymphadenopathy. Reproductive: Age-related atrophy of the uterus and ovaries. No concerning adnexal mass.No free pelvic fluid. Other: No pneumoperitoneum, ascites, or mesenteric inflammation. Musculoskeletal: No acute fracture. Absence of the lower sacrum has progressed in the interim. Redemonstrated large sacral decubitus ulcer, which is 5 cm deep, contacting the sacrococcygeal junction. Increased craniocaudal extent measuring 11.5 cm with undermining of the subcutaneous fat superiorly. Along the right lateral aspect within the medial right gluteal musculature, there is a small peripherally enhancing gas and fluid collection measuring 1.7 x 1.6 x 1.8 cm. IMPRESSION: 1. Circumferential wall thickening of the bladder may be due to underdistention or chronic inflammation from the indwelling urinary catheter. If there is concern for acute cystitis, laboratory correlation would be recommended. 2. Increasing size and depth of the sacral decubitus ulcer, measuring 5 cm deep and extending 11.5 cm in the craniocaudal extent. Progressive bony destruction of the lower sacrum, consistent with osteomyelitis. Along the right lateral aspect in the medial right gluteal musculature,  there is a small peripherally enhancing gas and fluid collection measuring 1.7 x 1.6 x 1.8 cm. This could reflect an intramuscular abscess or gas and fluid trapped within a lateral extension of the decubitus ulcer. 3. Multiple small nonobstructive bilateral renal calculi. No hydronephrosis. 4. Dependent airspace opacities in both lungs, likely atelectasis on the right. On the left, there is more pronounced nodularity, which is worrisome for aspiration. Aortic Atherosclerosis (ICD10-I70.0). Electronically Signed   By: Rogelia Myers M.D.   On: 12/01/2023 19:57    Microbiology Recent Results (from the past 240 hours)  Urine Culture     Status: Abnormal   Collection Time: 12/20/23  1:53 PM   Specimen: Urine, Random  Result Value Ref Range Status   Specimen Description   Final    URINE, RANDOM Performed at [facility], [address], [place], [place] [address]    Special Requests   Final    NONE Reflexed from  [id number] Performed at [facility], [address] [place], [place] [address]    Culture 90,000 COLONIES/mL PSEUDOMONAS AERUGINOSA (A)  Final   Report Status 12/22/2023 FINAL  Final   Organism ID, Bacteria PSEUDOMONAS AERUGINOSA (A)  Final      Susceptibility   Pseudomonas aeruginosa - MIC*    MEROPENEM  >=16 RESISTANT Resistant     CIPROFLOXACIN  <=0.06 SENSITIVE Sensitive     IMIPENEM >=16 RESISTANT Resistant     CEFTAZIDIME/AVIBACTAM 4 SENSITIVE Sensitive ug/mL    CEFTOLOZANE/TAZOBACTAM 1 SENSITIVE Sensitive ug/mL    TOBRAMYCIN <=1 SENSITIVE Sensitive     * 90,000 COLONIES/mL PSEUDOMONAS AERUGINOSA  Blood culture (routine single)     Status: Abnormal   Collection Time: 12/20/23  1:54 PM   Specimen: BLOOD  Result Value Ref Range Status   Specimen Description   Final    BLOOD RIGHT ANTECUBITAL Performed at [facility], [address], [place], [place] [address]    Special Requests   Final    BOTTLES DRAWN AEROBIC AND ANAEROBIC Blood Culture  adequate volume Performed at [facility], [address] [place], [place] [address]    Culture  Setup Time   Final    GRAM POSITIVE COCCI AEROBIC BOTTLE ONLY CRITICAL RESULT CALLED TO, READ BACK BY AND VERIFIED WITH: EMILY STEINBOCK 12/21/23 1543 SLM    Culture (A)  Final    STAPHYLOCOCCUS EPIDERMIDIS THE SIGNIFICANCE OF ISOLATING THIS ORGANISM FROM A SINGLE SET OF BLOOD CULTURES WHEN MULTIPLE SETS ARE DRAWN IS UNCERTAIN. PLEASE NOTIFY THE MICROBIOLOGY DEPARTMENT WITHIN ONE WEEK IF SPECIATION AND SENSITIVITIES ARE REQUIRED. Performed at [facility], [address], [place], [place] [address]    Report Status 12/22/2023 FINAL  Final  Blood Culture ID Panel (Reflexed)     Status: Abnormal   Collection Time: 12/20/23  1:54 PM  Result Value Ref Range Status   Enterococcus faecalis NOT DETECTED NOT DETECTED Final   Enterococcus Faecium NOT DETECTED NOT DETECTED Final   Listeria monocytogenes NOT DETECTED NOT DETECTED Final   Staphylococcus species DETECTED (A) NOT DETECTED Final    Comment: CRITICAL RESULT CALLED TO, READ BACK BY AND VERIFIED WITH: EMILY STEINBOCK 12/21/23 1543 SLM    Staphylococcus aureus (BCID) NOT DETECTED NOT DETECTED Final   Staphylococcus epidermidis DETECTED (A) NOT DETECTED Final    Comment: Methicillin (oxacillin) resistant coagulase negative staphylococcus. Possible blood culture contaminant (unless isolated from more than one blood culture draw or clinical case suggests pathogenicity). No antibiotic treatment is indicated for blood  culture contaminants. CRITICAL RESULT CALLED TO, READ BACK BY AND VERIFIED WITH: EMILY STEINBOCK 12/21/23 1543 SLM    Staphylococcus lugdunensis NOT DETECTED NOT DETECTED Final   Streptococcus species NOT DETECTED NOT DETECTED Final   Streptococcus agalactiae NOT DETECTED NOT DETECTED Final   Streptococcus pneumoniae NOT DETECTED NOT DETECTED Final   Streptococcus pyogenes NOT DETECTED NOT DETECTED Final    A.calcoaceticus-baumannii NOT DETECTED NOT DETECTED Final   Bacteroides fragilis NOT DETECTED NOT DETECTED Final   Enterobacterales NOT DETECTED NOT DETECTED Final   Enterobacter cloacae complex NOT DETECTED NOT DETECTED Final   Escherichia coli NOT DETECTED NOT DETECTED Final   Klebsiella aerogenes NOT DETECTED NOT DETECTED Final   Klebsiella oxytoca NOT DETECTED NOT DETECTED Final   Klebsiella pneumoniae NOT DETECTED NOT DETECTED Final   Proteus species NOT DETECTED NOT DETECTED Final   Salmonella species NOT DETECTED NOT DETECTED Final   Serratia marcescens NOT DETECTED NOT DETECTED Final  Haemophilus influenzae NOT DETECTED NOT DETECTED Final   Neisseria meningitidis NOT DETECTED NOT DETECTED Final   Pseudomonas aeruginosa NOT DETECTED NOT DETECTED Final   Stenotrophomonas maltophilia NOT DETECTED NOT DETECTED Final   Candida albicans NOT DETECTED NOT DETECTED Final   Candida auris NOT DETECTED NOT DETECTED Final   Candida glabrata NOT DETECTED NOT DETECTED Final   Candida krusei NOT DETECTED NOT DETECTED Final   Candida parapsilosis NOT DETECTED NOT DETECTED Final   Candida tropicalis NOT DETECTED NOT DETECTED Final   Cryptococcus neoformans/gattii NOT DETECTED NOT DETECTED Final   Methicillin resistance mecA/C DETECTED (A) NOT DETECTED Final    Comment: CRITICAL RESULT CALLED TO, READ BACK BY AND VERIFIED WITH: EMILY STEINBOCK 12/21/23 1543 SLM Performed at [facility], [address] [place], [place] [address]   Culture, blood (single)     Status: None   Collection Time: 12/21/23  6:01 AM   Specimen: BLOOD  Result Value Ref Range Status   Specimen Description BLOOD BLOOD RIGHT HAND  Final   Special Requests   Final    BOTTLES DRAWN AEROBIC ONLY Blood Culture adequate volume   Culture   Final    NO GROWTH 5 DAYS Performed at [facility], [address] [place], [place] [address]    Report Status 12/26/2023 FINAL  Final  Blood Culture (routine x 2)      Status: None (Preliminary result)   Collection Time: 12/27/23  5:59 PM   Specimen: BLOOD  Result Value Ref Range Status   Specimen Description BLOOD LEFT ARM  Final   Special Requests   Final    BOTTLES DRAWN AEROBIC AND ANAEROBIC Blood Culture results may not be optimal due to an inadequate volume of blood received in culture bottles   Culture   Final    NO GROWTH < 24 HOURS Performed at [facility], [address] [place], [place] [address]    Report Status PENDING  Incomplete  Resp panel by RT-PCR (RSV, Flu A&B, Covid) Anterior Nasal Swab     Status: Abnormal   Collection Time: 12/27/23  6:02 PM   Specimen: Anterior Nasal Swab  Result Value Ref Range Status   SARS Coronavirus 2 by RT PCR POSITIVE (A) NEGATIVE Final    Comment: (NOTE) SARS-CoV-2 target nucleic acids are DETECTED.  The SARS-CoV-2 RNA is generally detectable in upper respiratory specimens during the acute phase of infection. Positive results are indicative of the presence of the identified virus, but do not rule out bacterial infection or co-infection with other pathogens not detected by the test. Clinical correlation with patient history and other diagnostic information is necessary to determine patient infection status. The expected result is Negative.  Fact Sheet for Patients: BloggerCourse.com  Fact Sheet for Healthcare Providers: SeriousBroker.it  This test is not yet approved or cleared by the [place]  FDA and  has been authorized for detection and/or diagnosis of SARS-CoV-2 by FDA under an Emergency Use Authorization (EUA).  This EUA will remain in effect (meaning this test can be used) for the duration of  the COVID-19 declaration under Section 564(b)(1) of the A ct, 21 U.S.C. section 360bbb-3(b)(1), unless the authorization is terminated or revoked sooner.     Influenza A by PCR NEGATIVE NEGATIVE Final   Influenza B by PCR  NEGATIVE NEGATIVE Final    Comment: (NOTE) The Xpert Xpress SARS-CoV-2/FLU/RSV plus assay is intended as an aid in the diagnosis of influenza from Nasopharyngeal swab specimens and should not  be used as a sole basis for treatment. Nasal washings and aspirates are unacceptable for Xpert Xpress SARS-CoV-2/FLU/RSV testing.  Fact Sheet for Patients: BloggerCourse.com  Fact Sheet for Healthcare Providers: SeriousBroker.it  This test is not yet approved or cleared by the [place]  FDA and has been authorized for detection and/or diagnosis of SARS-CoV-2 by FDA under an Emergency Use Authorization (EUA). This EUA will remain in effect (meaning this test can be used) for the duration of the COVID-19 declaration under Section 564(b)(1) of the Act, 21 U.S.C. section 360bbb-3(b)(1), unless the authorization is terminated or revoked.     Resp Syncytial Virus by PCR NEGATIVE NEGATIVE Final    Comment: (NOTE) Fact Sheet for Patients: BloggerCourse.com  Fact Sheet for Healthcare Providers: SeriousBroker.it  This test is not yet approved or cleared by the [place]  FDA and has been authorized for detection and/or diagnosis of SARS-CoV-2 by FDA under an Emergency Use Authorization (EUA). This EUA will remain in effect (meaning this test can be used) for the duration of the COVID-19 declaration under Section 564(b)(1) of the Act, 21 U.S.C. section 360bbb-3(b)(1), unless the authorization is terminated or revoked.  Performed at [facility], [address] [place], [place] [address]   Blood Culture (routine x 2)     Status: None (Preliminary result)   Collection Time: 12/27/23  6:02 PM   Specimen: BLOOD RIGHT HAND  Result Value Ref Range Status   Specimen Description BLOOD RIGHT HAND  Final   Special Requests   Final    BOTTLES DRAWN AEROBIC AND ANAEROBIC Blood Culture  results may not be optimal due to an inadequate volume of blood received in culture bottles   Culture   Final    NO GROWTH < 24 HOURS Performed at [facility], [address] [place], [place] [address]    Report Status PENDING  Incomplete  MRSA Next Gen by PCR, Nasal     Status: None   Collection Time: 12/27/23  9:45 PM   Specimen: Nasal Mucosa; Nasal Swab  Result Value Ref Range Status   MRSA by PCR Next Gen NOT DETECTED NOT DETECTED Final    Comment: (NOTE) The GeneXpert MRSA Assay (FDA approved for NASAL specimens only), is one component of a comprehensive MRSA colonization surveillance program. It is not intended to diagnose MRSA infection nor to guide or monitor treatment for MRSA infections. Test performance is not FDA approved in patients less than 5 years old. Performed at [facility], [address] [place], [place] [address]   Respiratory (~20 pathogens) panel by PCR     Status: None   Collection Time: 12/27/23  9:45 PM   Specimen: Nasal Mucosa; Respiratory  Result Value Ref Range Status   Adenovirus NOT DETECTED NOT DETECTED Final   Coronavirus 229E NOT DETECTED NOT DETECTED Final    Comment: (NOTE) The Coronavirus on the Respiratory Panel, DOES NOT test for the novel  Coronavirus (2019 nCoV)    Coronavirus HKU1 NOT DETECTED NOT DETECTED Final   Coronavirus NL63 NOT DETECTED NOT DETECTED Final   Coronavirus OC43 NOT DETECTED NOT DETECTED Final   Metapneumovirus NOT DETECTED NOT DETECTED Final   Rhinovirus / Enterovirus NOT DETECTED NOT DETECTED Final   Influenza A NOT DETECTED NOT DETECTED Final   Influenza B NOT DETECTED NOT DETECTED Final   Parainfluenza Virus 1 NOT DETECTED NOT DETECTED Final   Parainfluenza Virus 2 NOT DETECTED NOT DETECTED Final   Parainfluenza Virus 3 NOT DETECTED NOT DETECTED  Final   Parainfluenza Virus 4 NOT DETECTED NOT DETECTED Final   Respiratory Syncytial Virus NOT DETECTED NOT DETECTED Final   Bordetella  pertussis NOT DETECTED NOT DETECTED Final   Bordetella Parapertussis NOT DETECTED NOT DETECTED Final   Chlamydophila pneumoniae NOT DETECTED NOT DETECTED Final   Mycoplasma pneumoniae NOT DETECTED NOT DETECTED Final    Comment: Performed at [facility], [address], [place], [place] [address]    Lab Basic Metabolic Panel: Recent Labs  Lab 12/22/23 0531 12/23/23 0418 12/27/23 1759 2024-01-02 0410  NA 137 135 129* 125*  K 3.4* 3.0* 3.3* 4.2  CL 108 104 100 102  CO2 22 25 12* 8*  GLUCOSE 47* 73 204* 297*  BUN <5* <5* 8 9  CREATININE <0.30* <0.30* 0.77 0.70  CALCIUM  8.1* 8.5* 8.5* 7.4*  MG 1.9 1.9 1.8 2.4  PHOS 2.9  --  2.1* 1.6*   Liver Function Tests: Recent Labs  Lab 12/22/23 0531 12/27/23 1759 01/02/2024 0410  AST  --  255* 1,301*  ALT  --  113* 419*  ALKPHOS  --  228* 296*  BILITOT  --  1.6* 1.5*  PROT  --  4.2* 3.7*  ALBUMIN <1.5* <1.5* <1.5*   Recent Labs  Lab 12/27/23 1759  LIPASE 16   No results for input(s): AMMONIA in the last 168 hours. CBC: Recent Labs  Lab 12/27/23 1759 January 02, 2024 0410  WBC 21.8* 49.7*  NEUTROABS 18.9*  --   HGB 8.5* 9.2*  HCT 27.5* 29.8*  MCV 92.9 94.6  PLT 478* 526*   Cardiac Enzymes: No results for input(s): CKTOTAL, CKMB, CKMBINDEX, TROPONINI in the last 168 hours. Sepsis Labs: Recent Labs  Lab 12/27/23 1759 12/27/23 1918 12/27/23 2145 01/02/24 0110 01/02/24 0410 Jan 02, 2024 1241  PROCALCITON  --   --  0.68  --   --   --   WBC 21.8*  --   --   --  49.7*  --   LATICACIDVEN 2.1* 2.7*  --  6.6* >9.0* >9.0*    Procedures/Operations  Central line placement Arterial line placement intubation   Magdelyn Roebuck Jan 02, 2024, 5:33 PM

## 2024-01-05 NOTE — Consult Note (Signed)
 Pharmacy Consult Note - Electrolytes  Sodium (mmol/L)  Date Value  01-17-2024 125 (L)  01/02/2018 137   Potassium (mmol/L)  Date Value  January 17, 2024 4.2   Magnesium  (mg/dL)  Date Value  91/75/7974 2.4   Calcium  (mg/dL)  Date Value  91/75/7974 7.4 (L)   Calcium , Total (PTH) (mg/dL)  Date Value  89/70/7975 9.7   Albumin (g/dL)  Date Value  91/75/7974 <1.5 (L)  01/02/2018 3.8   Phosphorus (mg/dL)  Date Value  91/75/7974 1.6 (L)    ASSESSMENT: 63 y.o. female with PMH including T2DM, hypothyroidism, sacral osteomyelitis, CAD presenting with severe sepsis and hypotension requiring vasopressor support. Lactate elevated at 2.7. Soon after arrival was in vtach, s/p amiodarone  bolus and synchronized cardioversion. Pharmacy has been consulted to monitor and replace electrolytes.  Lab Results  Component Value Date   CREATININE 0.70 01-17-24   CREATININE 0.77 12/27/2023   CREATININE <0.30 (L) 12/23/2023    IVF: 1000mL NS boluses x 2 so far   Pertinent medications: n/a  Goal of Therapy:  K >=4.0 and Mg >=2.0  PLAN: Phos 1.6, Na 125 >> Naphos 45mmol IV x 1 Check electrolytes with next AM labs   Thank you for allowing pharmacy to be a part of this patient's care.  Belinda Schlichting A Nesbit Michon, PharmD Clinical Pharmacist 01/17/24 10:37 AM

## 2024-01-05 NOTE — Plan of Care (Signed)
  Problem: Nutritional: Goal: Progress toward achieving an optimal weight will improve Outcome: Not Progressing   Problem: Skin Integrity: Goal: Risk for impaired skin integrity will decrease Outcome: Not Progressing   Problem: Tissue Perfusion: Goal: Adequacy of tissue perfusion will improve Outcome: Not Progressing   Problem: Clinical Measurements: Goal: Ability to maintain clinical measurements within normal limits will improve Outcome: Not Progressing Goal: Will remain free from infection Outcome: Not Progressing Goal: Diagnostic test results will improve Outcome: Not Progressing Goal: Respiratory complications will improve Outcome: Not Progressing   Problem: Pain Managment: Goal: General experience of comfort will improve and/or be controlled Outcome: Progressing   Problem: Safety: Goal: Ability to remain free from injury will improve Outcome: Progressing   Problem: Skin Integrity: Goal: Risk for impaired skin integrity will decrease Outcome: Progressing

## 2024-01-05 NOTE — Progress Notes (Signed)
  Echocardiogram 2D Echocardiogram has been performed.  Thedora GORMAN Louder 2024-01-16, 8:33 AM

## 2024-01-05 NOTE — Procedures (Signed)
 Extubation Procedure Note  Patient Details:   Name: Ruth Mayo DOB: [birth year] MRN: [id number]   Airway Documentation:    Vent end date: January 23, 2024 Vent end time: 1548   Evaluation  O2 sats: stable throughout Complications: No apparent complications Patient did tolerate procedure well. Bilateral Breath Sounds: Diminished, Rhonchi   Patient extubated to comfort care per MD order at 1548 with no complications.    Evalene DELENA Jerry 2024-01-23, 4:00 PM

## 2024-01-05 NOTE — Progress Notes (Signed)
 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   On chart review, patient was recently hospitalized with sepsis secondary to UTI with cultures growing Pseudomonas aeruginosa's.  She was discharged on ciprofloxacin  and per husband she had a chest x-ray done on Wednesday which showed pneumonia and was started on antibiotics with no improvement.  EMS was called today for worsening mental status and hypotension.  On EMS arrival patient was found with profound hypotension requiring Levophed  drip.   ED Course: Initial vital signs showed HR of 100 beats/minute, BP 51/39 mm Hg, the RR 24 breaths/minute, and the oxygen saturation 99% on 4 L and a temperature of 35F (35C). Pertinent Labs/Diagnostics Findings: Na+/ K+:129/3.3 Glucose: 204 BUN/Cr.:8/0.77 AST/ALT:255/113, alkphos 228 WBC: 21.8K/L  Hgb/Hct:8.5/27.5 Plts:478  PCT: 0.68  Lactic acid:2.1  COVID PCR: positive,  troponin:193   VBG: pO2 46; pCO2 32; pH 7.26;  HCO3 14.4, %O2 Sat 77.3.  CXR> CTH>neg CTA Chest> CT Abd/pelvis> pending   Patient given 30 cc/kg of fluids and started on broad-spectrum antibiotics cefepime  for suspected sepsis with septic shock iso COVID pneumonia, UTI and wound infection. Patient remained hypotensive despite IVF boluses therefore was started on  Levophed .  During central line placement, patient went into V. tach requiring synchronized cardioversion at 200 J with restoration of rhythm to A-fib with RVR.  She was started on amiodarone  drip for rate control but she remained hypotensive requiring additional vasopressin  and epinephrine  drip to keep MAP >65.  PCCM consulted.  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 rate (!) 28, height 5' (1.524 m), weight 80.3 kg, SpO2 (!) 51%.  Vent Mode: PRVC FiO2 (%):  [80 %-100 %] 80 % Set Rate:  [28 bmp] 28 bmp Vt Set:  [440 mL] 440 mL PEEP:  [6 cmH20-8 cmH20] 6 cmH20 Plateau Pressure:  [22 cmH20] 22 cmH20   Intake/Output Summary (Last 24 hours) at 17-Jan-2024 0852 Last data filed at 01/17/2024 0800 Gross per 24 hour  Intake 6852.62 ml  Output 450 ml  Net 6402.62 ml   Filed Weights   12/27/23 1737 01/17/2024 0440  Weight: 80.3 kg 80.3 kg    Examination: Physical Exam Constitutional:       Appearance: She is ill-appearing.  Cardiovascular:     Rate and Rhythm: Regular rhythm. Tachycardia present.     Pulses: Normal pulses.     Heart sounds: Normal heart sounds.  Pulmonary:     Breath sounds: No wheezing.     Comments: Ventilated breath sounds bilaterally Abdominal:     Palpations: Abdomen is soft.  Musculoskeletal:     Comments: Large pressure ulcer  Neurological:     Mental Status: She is disoriented.     Comments: unresponsive      Assessment and Plan   63 year old female with multiple medical problems, poor functional baseline, with deep sacral decubitus ulcers and underlying CAD, VTE, and limited functional status (bed bound, g-tube dependent) who presents with altered mental status. She was found to have septic shock, and intubated in the ED. Her vasopressor dose has escalated significantly, with worsening perfusion and rising lactic acid.  #Septic Shock #Acute Hypoxic Respiratory Failure #Infected Decubitus Ulcers #AKI  #Metabolic Acidosis #Intermediate Low Risk PE #COVID +ve #Cardiac Arrest #T2DM #Hypothyroidism #Toxic Metabolic Encephalopathy  Neuro - encephalopathy in the setting of septic shock and profound metabolic acidosis. Off sedation and is unresponsive.  CV - progressive septic shock that is unresponsive to multiple vasopressors. Currently maxed out on nor-epinephrine , vasopressin , and epinephrine . She's received hydrocortisone  as well as methylene blue  and her hemodynamics and severely compromised, with rising lactic acid and worsening blood pressure. Unable to maintain MAP > 65 mmHg despite our best efforts. This has been discussed with family and she will be transitioned to comfort measures once the whole family is at the bedside.  Pulm - respiratory failure in the setting of septic shock as well as PE. I doubt the PE is hemodynamically significant given very small clot burden and normal RV:LV ratio on CT. Will hold off on any systemic  thrombolysis. She's maxed out on ventilatory support, and unfortunately remains acidemic. Continue vent support for now pending goals of care and likely transition to comfort measures with compassionate extubation.  GI - NPO, on SUP  Renal - severe kidney injury and metabolic acidosis, on IV sodium bicarbonate  and remains severely acidemic due to lactic acid build up. She would not tolerate CRRT with her blood pressure.  Endo - ICU glycemic protocol. Received hydrocortisone   Hem/Onc - was on Apixaban  previously, no on heparin  given PE.  ID - broad spectrum antibiotics initiated. 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  Other - patient unfortunately with very poor prognosis, discussed overnight with family and awaiting other family members before transitioning to comfort measures only given futility of care.  Best Practice (right click and Reselect all SmartList Selections daily)   Diet/type: NPO DVT prophylaxis systemic heparin  Pressure ulcer(s): present on admission  GI prophylaxis: PPI Lines: Central line, Arterial Line, and yes and it is still needed Foley:  Yes,  and it is still needed Code Status:  DNR Last date of multidisciplinary goals of care discussion [16-Jan-2024]  Labs   CBC: Recent Labs  Lab 12/27/23 1759 2024-01-16 0410  WBC 21.8* 49.7*  NEUTROABS 18.9*  --   HGB 8.5* 9.2*  HCT 27.5* 29.8*  MCV 92.9 94.6  PLT 478* 526*    Basic Metabolic Panel: Recent Labs  Lab 12/21/23 1552 12/22/23 0531 12/23/23 0418 12/27/23 1759 01-16-24 0410  NA  --  137 135 129* 125*  K 3.8 3.4* 3.0* 3.3* 4.2  CL  --  108 104 100 102  CO2  --  22 25 12* 8*  GLUCOSE  --  47* 73 204* 297*  BUN  --  <5* <5* 8 9  CREATININE  --  <0.30* <0.30* 0.77 0.70  CALCIUM   --  8.1* 8.5* 8.5* 7.4*  MG  --  1.9 1.9 1.8 2.4  PHOS  --  2.9  --  2.1* 1.6*   GFR: Estimated Creatinine Clearance: 70.1 mL/min (by C-G  formula based on SCr of 0.7 mg/dL). Recent Labs  Lab 12/27/23 1759 12/27/23 1918 12/27/23 2145 01/16/24 0110 16-Jan-2024 0410  PROCALCITON  --   --  0.68  --   --   WBC 21.8*  --   --   --  49.7*  LATICACIDVEN 2.1* 2.7*  --  6.6* >9.0*    Liver Function Tests: Recent Labs  Lab 12/22/23 0531 12/27/23 1759 01/16/2024 0410  AST  --  255* 1,301*  ALT  --  113* 419*  ALKPHOS  --  228* 296*  BILITOT  --  1.6* 1.5*  PROT  --  4.2* 3.7*  ALBUMIN <1.5* <1.5* <1.5*   Recent Labs  Lab 12/27/23 1759  LIPASE 16   No results for input(s): AMMONIA in the last 168 hours.  ABG    Component Value Date/Time   PHART 7.03 (LL) 01/16/24 0410   PCO2ART 27 (L) 2024/01/16 0410   PO2ART 150 (H) 2024/01/16 0410   HCO3 7.2 (L) 2024-01-16 0410   ACIDBASEDEF 22.6 (H) 16-Jan-2024 0410   O2SAT 99.6 01-16-2024 0410     Coagulation Profile: Recent Labs  Lab 12/27/23 1759 January 16, 2024 0410  INR 7.3* >10.0*    Cardiac Enzymes: No results for input(s): CKTOTAL, CKMB, CKMBINDEX, TROPONINI in the last 168 hours.  HbA1C: Hgb A1c MFr Bld  Date/Time Value Ref Range Status  12/27/2023 09:45 PM 5.8 (H) 4.8 - 5.6 % Final    Comment:    (NOTE) Diagnosis of Diabetes The following HbA1c ranges recommended by the American Diabetes Association (ADA) may be used as an aid in the diagnosis of diabetes mellitus.  Hemoglobin             Suggested A1C NGSP%              Diagnosis  <5.7                   Non Diabetic  5.7-6.4                Pre-Diabetic  >6.4                   Diabetic  <7.0                   Glycemic control for                       adults with diabetes.  03/19/2023 04:37 AM 7.6 (H) 4.8 - 5.6 % Final    Comment:    (NOTE) Pre diabetes:          5.7%-6.4%  Diabetes:              >6.4%  Glycemic control for   <7.0% adults with diabetes     CBG: Recent Labs  Lab 01-20-24 0021 Jan 20, 2024 0413 2024-01-20 0743  GLUCAP 377* 284* 178*    Review of Systems:    N/A  Past Medical History:  She,  has a past medical history of Anxiety, Arthritis, Bedbound, CAD, multiple vessel, Depression, DM type 2 with diabetic peripheral neuropathy (HCC), DM2 (diabetes mellitus, type 2) (HCC), DVT (deep venous thrombosis) (HCC), Gastrostomy tube dependent (HCC), History of iron deficiency anemia, HLD (hyperlipidemia), HTN (hypertension), Hypothyroidism, Memory deficit after cerebral infarction, Migraines, Morbid obesity (HCC), Pulmonary embolism (HCC), Sacral decubitus ulcer, Sacral osteomyelitis (HCC), Stroke (HCC), and 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   Surgical History:   Past Surgical History:  Procedure Laterality Date   CESAREAN SECTION     CORONARY ARTERY BYPASS GRAFT  01/13/2023   IR REMOVAL TUN CV CATH W/O FL  03/20/2023   TUBAL LIGATION       Social History:   reports that she quit smoking about 20 years ago. Her smoking use included cigarettes. She started smoking about 45 years ago. She has a 25 pack-year smoking history. She has never used smokeless tobacco. She reports current alcohol  use. She reports that she does not use drugs.   Family History:  Her family history includes Diabetes in her maternal grandmother, mother, and sister; Hypertension in her sister; Thyroid  disease in her sister.   Allergies Not on File   Home Medications  Prior to Admission medications   Medication Sig Start Date End Date Taking? Authorizing Provider  amoxicillin -clavulanate (AUGMENTIN ) 875-125 MG tablet Take 1 tablet by mouth 2 (two) times daily. For 10 days. Started 12/25/23   Yes [provider]  apixaban  (ELIQUIS ) 5 MG TABS tablet Take 5 mg by mouth 2 (two) times daily.   Yes [provider]  atorvastatin  (LIPITOR) 80 MG tablet Take 80 mg by mouth at bedtime. Give 1 tablet via g-tube at bedtime   Yes [provider]  azithromycin (ZITHROMAX) 500 MG tablet Take 500 mg by mouth daily. For 5 days    Yes [provider]  bisacodyl  (DULCOLAX) 5 MG EC tablet Take 5 mg by mouth daily as needed for moderate constipation.   Yes [provider]  famotidine  (PEPCID ) 40 MG tablet Take 40 mg by mouth at bedtime.   Yes [provider]  gabapentin  (NEURONTIN ) 300 MG capsule Take 3 capsules (900 mg total) by mouth 3 (three) times daily. Patient taking differently: Place 900 mg into feeding tube 3 (three) times daily. 01/02/18  Yes Weber, Sarah L, PA-C  HYDROcodone -acetaminophen  (NORCO/VICODIN) 5-325 MG tablet Take 2 tablets by mouth every 4 (four) hours as needed for moderate pain (pain score 4-6).   Yes [provider]  levothyroxine  (SYNTHROID ) 50 MCG tablet Take 50 mcg by mouth daily.   Yes [provider]  methenamine (MANDELAMINE) 1 g tablet Take 1,000 mg by mouth 2 (two) times daily.   Yes [provider]  midodrine  (PROAMATINE ) 10 MG tablet Take 1 tablet (10 mg total) by mouth 3 (three) times daily. 12/23/23  Yes Jens Durand, MD  ondansetron  (ZOFRAN -ODT) 4 MG disintegrating tablet Take 4  mg by mouth every 8 (eight) hours as needed for nausea or vomiting.   Yes [provider]  polyethylene glycol (MIRALAX  / GLYCOLAX ) 17 g packet Take 17 g by mouth daily. Patient taking differently: Take 17 g by mouth daily. Via g-tube. One time a day, mix with 6-8 ounces of liquid 12/11/23  Yes Laurita Pillion, MD  potassium chloride  (KLOR-CON ) 20 MEQ packet Place 20 mEq into feeding tube daily for 3 days. 12/24/23 12/27/23 Yes Jens Durand, MD  promethazine (PHENERGAN) 25 MG tablet Take 25 mg by mouth every 6 (six) hours as needed for nausea or vomiting.   Yes [provider]  senna-docusate (SENOKOT-S) 8.6-50 MG tablet Take 1 tablet by mouth at bedtime.   Yes [provider]  sertraline  (ZOLOFT ) 50 MG tablet Take 1 tablet (50 mg total) by mouth daily. 12/24/23  Yes Jens Durand, MD  zinc  sulfate, 50mg  elemental zinc , 220 (50 Zn) MG  capsule Take 1 capsule (220 mg total) by mouth daily. 12/24/23  Yes Jens Durand, MD     The patient is critically ill due to septic shock, acute hypoxic respiratory failure, renal failure, severe metabolic acidosis, PE.  Critical care was necessary to treat or prevent imminent or life-threatening deterioration. Critical care time was spent by me on the following activities: development of a treatment plan with the patient and/or surrogate as well as nursing, discussions with consultants, evaluation of the patient's response to treatment, examination of the patient, obtaining a history from the patient or surrogate, ordering and performing treatments and interventions, ordering and review of laboratory studies, ordering and review of radiographic studies, review of telemetry data including pulse oximetry, re-evaluation of patient's condition and participation in multidisciplinary rounds.   I personally spent 68 minutes providing critical care not including any separately billable procedures.   Belva November, MD [facility] 26-Jan-2024 9:29 AM

## 2024-01-05 NOTE — IPAL (Signed)
 INTERDISCIPLINARY GOALS OF CARE FAMILY MEETING     NAME: Ruth Mayo MRN: [id number] DOB : July 02, 1960 ATTENDING PHYSICIAN: Isadora Hose, MD    Date carried out: 01/26/24 @6 :30am  Location of the meeting: Bedside   Member's involved: NP and Family Member or next of kin husband   Durable Power of Insurance risk surveyor:    Discussion:  Advance Care Planning/Goals of Care discussion was performed during the course of treatment to decide on type of care right for this patient following admission to the ICU.   I met with patient's husband to discuss goals of care in details following change in patient's current status. Reviewed patient's worsening lab, ABGs, vital signs including unstable HR and blood pressure requiring multiple pressors NE. EPI, Vasopressin  and Methylene Blue  and overall poor prognosis with the family at the bedside and answered all their question.   Discussed prognosis, expected outcome with or without ongoing aggressive treatments and the options for de-escalation of care.   Diagnosis(es): Acute hypoxic respiratory failure secondary iso Acute PE, COVID, Pneumonia Severe Sepsis with multiorgan Failure Prognosis: Poor Code Status: DNR Disposition: ICU Next Steps:  Family understands the situation. They have consented and agreed to DNR/DNI with plans to transition to comfort care when the rest of the family members arrives at the bedside.    Family are satisfied with Plan of action and management. All questions answered     Total Time Spent Face to Face addressing advance care planning in the presence of the Patient: 35 minutes       Almarie Nose, DNP, FNP-C, AGACNP-BC Acute Care Nurse Practitioner [facility] Pager: [phone] [place] [facility]

## 2024-01-05 DEATH — deceased

## 2024-01-07 ENCOUNTER — Ambulatory Visit: Admitting: Student in an Organized Health Care Education/Training Program

## 2024-01-16 LAB — URINE CULTURE: Culture: 90000 — AB
